# Patient Record
Sex: Female | Born: 1950 | Race: White | Hispanic: No | Marital: Married | Smoking: Current some day smoker
Health system: Southern US, Community
[De-identification: ages and names within clinical notes are randomized; demographics above are authoritative.]

## PROBLEM LIST (undated history)

## (undated) DIAGNOSIS — H269 Unspecified cataract: Secondary | ICD-10-CM

## (undated) DIAGNOSIS — I350 Nonrheumatic aortic (valve) stenosis: Secondary | ICD-10-CM

## (undated) DIAGNOSIS — T7840XA Allergy, unspecified, initial encounter: Secondary | ICD-10-CM

## (undated) DIAGNOSIS — E119 Type 2 diabetes mellitus without complications: Secondary | ICD-10-CM

## (undated) DIAGNOSIS — I639 Cerebral infarction, unspecified: Secondary | ICD-10-CM

## (undated) DIAGNOSIS — R011 Cardiac murmur, unspecified: Secondary | ICD-10-CM

## (undated) DIAGNOSIS — Z5189 Encounter for other specified aftercare: Secondary | ICD-10-CM

## (undated) HISTORY — DX: Nonrheumatic aortic (valve) stenosis: I35.0

## (undated) HISTORY — DX: Cerebral infarction, unspecified: I63.9

## (undated) HISTORY — PX: APPENDECTOMY: SHX54

## (undated) HISTORY — DX: Encounter for other specified aftercare: Z51.89

## (undated) HISTORY — PX: CHOLECYSTECTOMY: SHX55

## (undated) HISTORY — DX: Unspecified cataract: H26.9

## (undated) HISTORY — DX: Cardiac murmur, unspecified: R01.1

## (undated) HISTORY — PX: PANCREATECTOMY: SHX1019

## (undated) HISTORY — DX: Allergy, unspecified, initial encounter: T78.40XA

## (undated) HISTORY — PX: SPLENECTOMY, TOTAL: SHX788

## (undated) HISTORY — DX: Type 2 diabetes mellitus without complications: E11.9

---

## 1998-05-15 ENCOUNTER — Other Ambulatory Visit: Admission: RE | Admit: 1998-05-15 | Discharge: 1998-05-15 | Payer: Self-pay | Admitting: Obstetrics and Gynecology

## 1998-11-16 ENCOUNTER — Other Ambulatory Visit: Admission: RE | Admit: 1998-11-16 | Discharge: 1998-11-16 | Payer: Self-pay | Admitting: Obstetrics and Gynecology

## 2000-02-25 ENCOUNTER — Other Ambulatory Visit: Admission: RE | Admit: 2000-02-25 | Discharge: 2000-02-25 | Payer: Self-pay | Admitting: Obstetrics and Gynecology

## 2000-08-14 ENCOUNTER — Inpatient Hospital Stay (HOSPITAL_COMMUNITY): Admission: EM | Admit: 2000-08-14 | Discharge: 2000-08-15 | Payer: Self-pay | Admitting: Emergency Medicine

## 2000-08-14 ENCOUNTER — Encounter: Payer: Self-pay | Admitting: Emergency Medicine

## 2000-12-21 ENCOUNTER — Emergency Department (HOSPITAL_COMMUNITY): Admission: EM | Admit: 2000-12-21 | Discharge: 2000-12-22 | Payer: Self-pay | Admitting: Emergency Medicine

## 2000-12-21 ENCOUNTER — Encounter: Payer: Self-pay | Admitting: Emergency Medicine

## 2000-12-23 ENCOUNTER — Inpatient Hospital Stay (HOSPITAL_COMMUNITY): Admission: EM | Admit: 2000-12-23 | Discharge: 2000-12-31 | Payer: Self-pay | Admitting: Emergency Medicine

## 2000-12-23 ENCOUNTER — Encounter: Payer: Self-pay | Admitting: Emergency Medicine

## 2000-12-23 ENCOUNTER — Encounter (INDEPENDENT_AMBULATORY_CARE_PROVIDER_SITE_OTHER): Payer: Self-pay | Admitting: *Deleted

## 2000-12-23 ENCOUNTER — Encounter: Payer: Self-pay | Admitting: General Surgery

## 2000-12-28 ENCOUNTER — Encounter: Payer: Self-pay | Admitting: General Surgery

## 2001-06-25 ENCOUNTER — Other Ambulatory Visit: Admission: RE | Admit: 2001-06-25 | Discharge: 2001-06-25 | Payer: Self-pay | Admitting: Obstetrics and Gynecology

## 2001-06-28 ENCOUNTER — Encounter: Admission: RE | Admit: 2001-06-28 | Discharge: 2001-06-28 | Payer: Self-pay | Admitting: Obstetrics and Gynecology

## 2001-06-28 ENCOUNTER — Encounter: Payer: Self-pay | Admitting: Obstetrics and Gynecology

## 2002-10-09 ENCOUNTER — Other Ambulatory Visit: Admission: RE | Admit: 2002-10-09 | Discharge: 2002-10-09 | Payer: Self-pay | Admitting: Obstetrics and Gynecology

## 2003-07-07 ENCOUNTER — Emergency Department (HOSPITAL_COMMUNITY): Admission: EM | Admit: 2003-07-07 | Discharge: 2003-07-08 | Payer: Self-pay | Admitting: Emergency Medicine

## 2003-08-17 ENCOUNTER — Emergency Department (HOSPITAL_COMMUNITY): Admission: EM | Admit: 2003-08-17 | Discharge: 2003-08-17 | Payer: Self-pay | Admitting: Emergency Medicine

## 2004-07-12 ENCOUNTER — Inpatient Hospital Stay (HOSPITAL_COMMUNITY): Admission: EM | Admit: 2004-07-12 | Discharge: 2004-07-14 | Payer: Self-pay | Admitting: Emergency Medicine

## 2004-07-12 ENCOUNTER — Ambulatory Visit: Payer: Self-pay | Admitting: Family Medicine

## 2004-07-15 ENCOUNTER — Ambulatory Visit: Payer: Self-pay | Admitting: Family Medicine

## 2004-10-25 ENCOUNTER — Ambulatory Visit: Payer: Self-pay | Admitting: Family Medicine

## 2004-10-25 ENCOUNTER — Inpatient Hospital Stay (HOSPITAL_COMMUNITY): Admission: EM | Admit: 2004-10-25 | Discharge: 2004-10-27 | Payer: Self-pay | Admitting: Emergency Medicine

## 2005-09-26 ENCOUNTER — Emergency Department (HOSPITAL_COMMUNITY): Admission: EM | Admit: 2005-09-26 | Discharge: 2005-09-26 | Payer: Self-pay | Admitting: Emergency Medicine

## 2006-08-01 ENCOUNTER — Emergency Department (HOSPITAL_COMMUNITY): Admission: EM | Admit: 2006-08-01 | Discharge: 2006-08-01 | Payer: Self-pay | Admitting: Emergency Medicine

## 2006-09-07 DIAGNOSIS — F17200 Nicotine dependence, unspecified, uncomplicated: Secondary | ICD-10-CM | POA: Insufficient documentation

## 2007-01-04 ENCOUNTER — Emergency Department (HOSPITAL_COMMUNITY): Admission: EM | Admit: 2007-01-04 | Discharge: 2007-01-04 | Payer: Self-pay | Admitting: Emergency Medicine

## 2007-01-05 ENCOUNTER — Emergency Department (HOSPITAL_COMMUNITY): Admission: EM | Admit: 2007-01-05 | Discharge: 2007-01-06 | Payer: Self-pay | Admitting: Emergency Medicine

## 2007-01-06 ENCOUNTER — Inpatient Hospital Stay (HOSPITAL_COMMUNITY): Admission: EM | Admit: 2007-01-06 | Discharge: 2007-01-12 | Payer: Self-pay | Admitting: Family Medicine

## 2008-01-06 ENCOUNTER — Ambulatory Visit: Payer: Self-pay | Admitting: Cardiology

## 2008-01-10 ENCOUNTER — Observation Stay (HOSPITAL_COMMUNITY): Admission: EM | Admit: 2008-01-10 | Discharge: 2008-01-11 | Payer: Self-pay | Admitting: Emergency Medicine

## 2008-01-28 ENCOUNTER — Ambulatory Visit: Payer: Self-pay

## 2008-02-13 ENCOUNTER — Ambulatory Visit: Payer: Self-pay | Admitting: Cardiology

## 2008-03-10 ENCOUNTER — Ambulatory Visit: Payer: Self-pay | Admitting: Endocrinology

## 2008-03-10 DIAGNOSIS — F329 Major depressive disorder, single episode, unspecified: Secondary | ICD-10-CM | POA: Insufficient documentation

## 2008-03-10 DIAGNOSIS — E1169 Type 2 diabetes mellitus with other specified complication: Secondary | ICD-10-CM | POA: Insufficient documentation

## 2008-03-10 DIAGNOSIS — K5730 Diverticulosis of large intestine without perforation or abscess without bleeding: Secondary | ICD-10-CM | POA: Insufficient documentation

## 2008-03-10 DIAGNOSIS — E785 Hyperlipidemia, unspecified: Secondary | ICD-10-CM

## 2008-03-10 DIAGNOSIS — E119 Type 2 diabetes mellitus without complications: Secondary | ICD-10-CM | POA: Insufficient documentation

## 2008-04-09 ENCOUNTER — Ambulatory Visit: Payer: Self-pay | Admitting: Endocrinology

## 2008-05-16 ENCOUNTER — Ambulatory Visit: Payer: Self-pay | Admitting: Cardiology

## 2008-09-07 IMAGING — CR DG TIBIA/FIBULA 2V*L*
4 series · 4 of 4 positions shown · non-contrast
Comparison: none

CLINICAL DATA: Left lower leg laceration, cat bite, pain and swelling.      
 LEFT TIBIA AND FIBULA - 2 VIEW:

[t tib/fib ap left (1 of 2)]
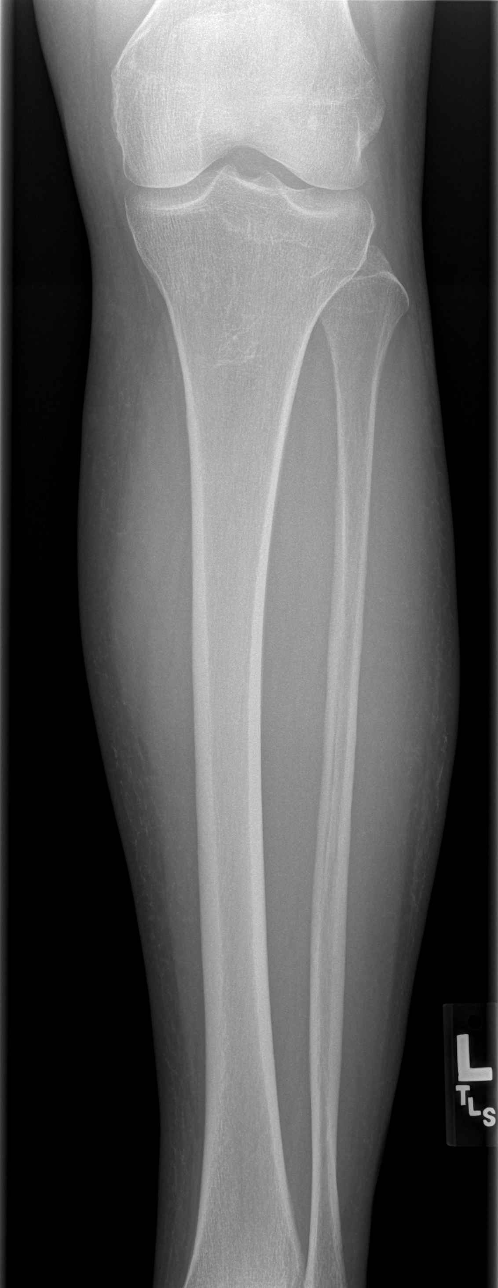

[t tib/fib ap left (2 of 2)]
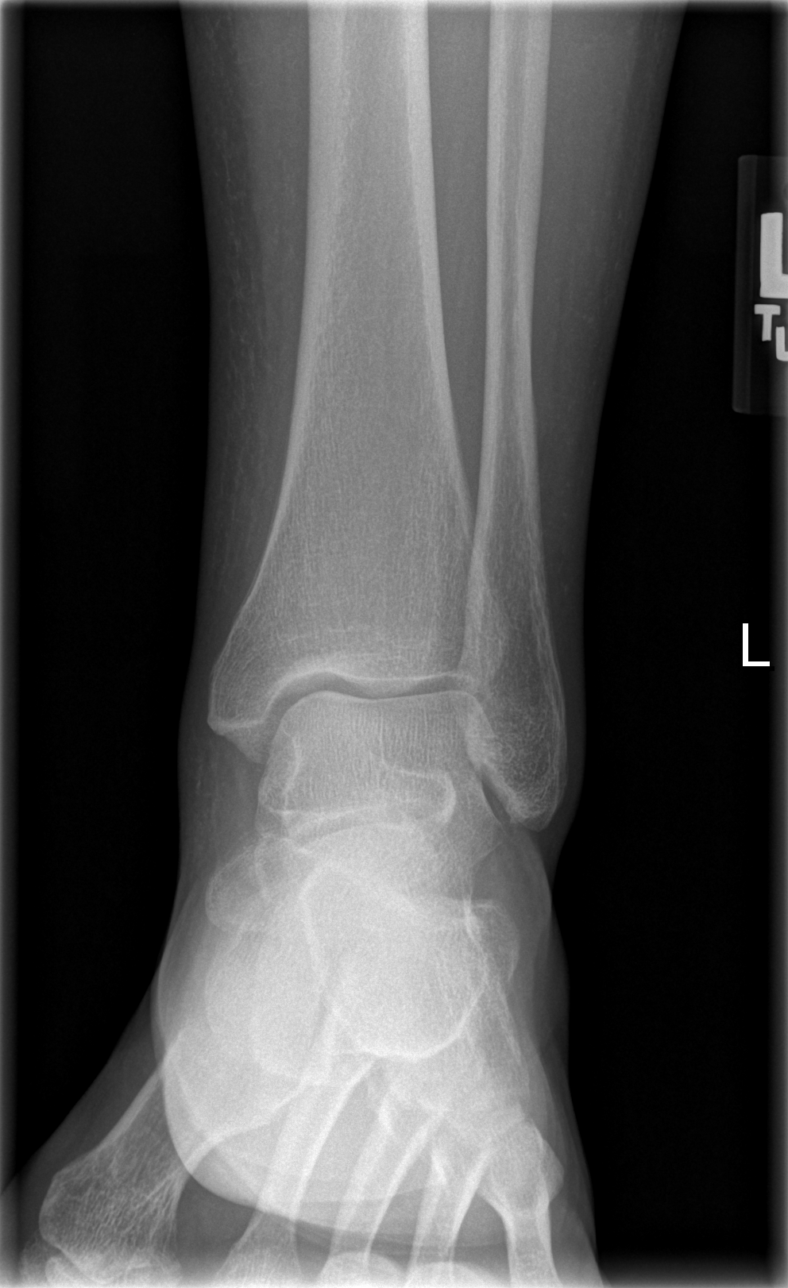

[t tib/fib lat left (1 of 2)]
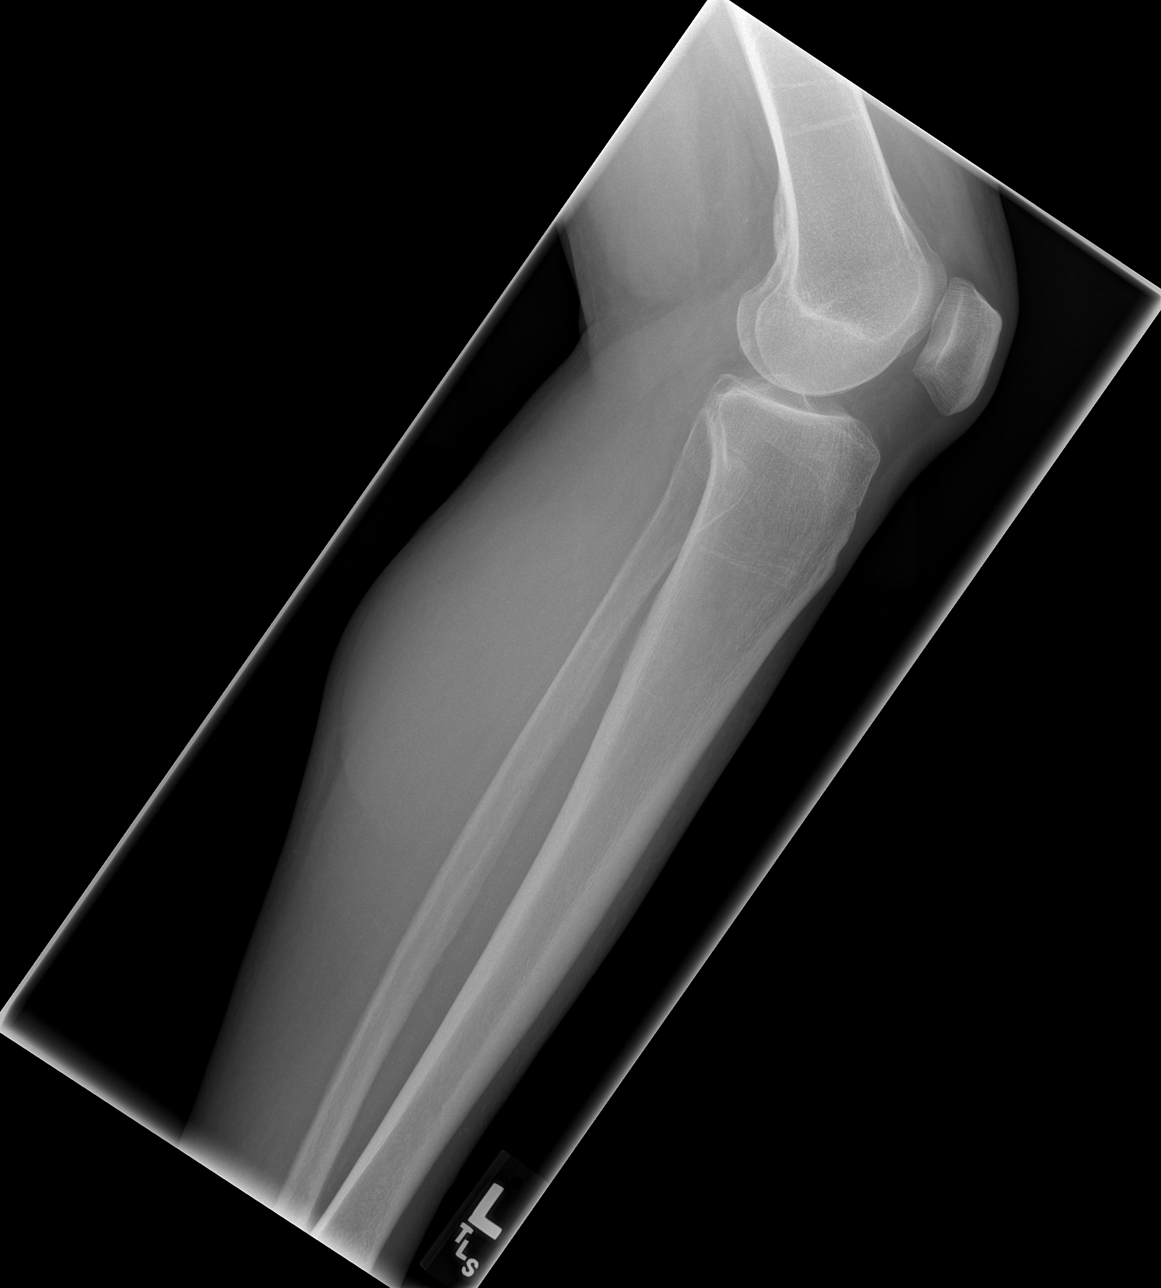

[t tib/fib lat left (2 of 2)]
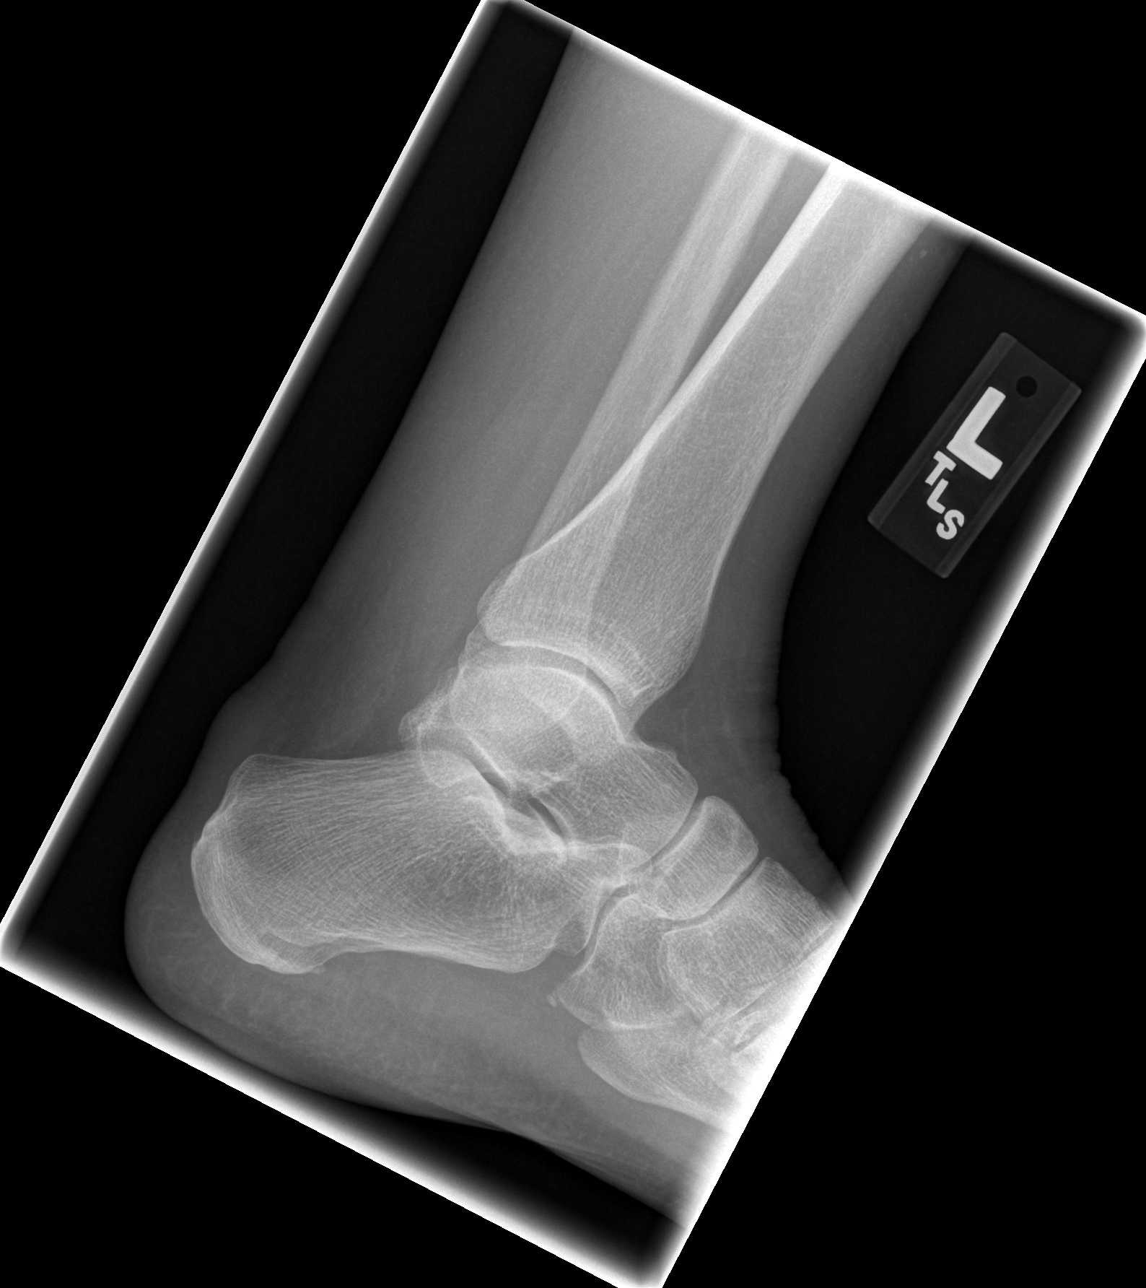

[4 of 4 positions shown; findings below may reference images not displayed]

FINDINGS: Bones are osteopenic out of proportion to that expected for the patient?s age.  No acute fracture, soft tissue abnormality, or radiopaque foreign body.
IMPRESSION: Normal exam.  Osteopenia.

## 2009-02-10 ENCOUNTER — Ambulatory Visit: Payer: Self-pay | Admitting: Endocrinology

## 2009-02-10 DIAGNOSIS — E7800 Pure hypercholesterolemia, unspecified: Secondary | ICD-10-CM | POA: Insufficient documentation

## 2009-02-10 LAB — CONVERTED CEMR LAB
ALT: 17 units/L (ref 0–35)
AST: 19 units/L (ref 0–37)
Albumin: 4 g/dL (ref 3.5–5.2)
CO2: 28 meq/L (ref 19–32)
Calcium: 9.5 mg/dL (ref 8.4–10.5)
Chloride: 106 meq/L (ref 96–112)
Cholesterol: 245 mg/dL — ABNORMAL HIGH (ref 0–200)
Creatinine, Ser: 0.8 mg/dL (ref 0.4–1.2)
HDL: 38.8 mg/dL — ABNORMAL LOW (ref >39.00)
Hgb A1c MFr Bld: 6.5 % (ref 4.6–6.5)
Sodium: 144 meq/L (ref 135–145)
TSH: 1.45 microintl units/mL (ref 0.35–5.50)
Total Bilirubin: 0.7 mg/dL (ref 0.3–1.2)
Triglycerides: 205 mg/dL — ABNORMAL HIGH (ref 0.0–149.0)
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
## 2010-11-23 NOTE — Discharge Summary (Signed)
NAMEALTO, MONGOLD NO.:  [id number]   MEDICAL RECORD NO.:  [id number]          PATIENT TYPE:  INP   LOCATION:  5010                         FACILITY:  [facility]   PHYSICIAN:  Altha Harm, MDDATE OF BIRTH:  1951-04-27   DATE OF ADMISSION:  01/06/2007  DATE OF DISCHARGE:  01/12/2007                               DISCHARGE SUMMARY   DISCHARGE DISPOSITION:  Home.   FINAL DISCHARGE DIAGNOSES:  1. Cat scratch lesion.  2. Cellulitis of left lower extremity.  3. Diabetes type 2, uncontrolled.  4. Candida vaginitis.  5. Osteopenia   DISCHARGE MEDICATIONS:  1. Avelox 400 mg p.o. daily x7 days.  2. Diflucan 150 mg p.o. x1 to be taken at the completed course of the      antibiotics.  3. Amaryl 1 mg p.o. daily.  4. Calcium plus vitamin D 100 mg b.i.d.   CONSULTANTS:  None.   PROCEDURES:  None.   DIAGNOSTIC STUDIES:  1. CT of the lower extremity done on June 29 which showed no evidence      of a focal abscess or bony destruction.  However, the cellulitis      does come in contact with the lateral malleolus of the ankle.  2. Ultrasound of the left lower extremity done on July 2 which shows      edema without abscess.  3. X-ray of tibia-fibula done on June 26 which showed normal      examination with mild osteopenia.   CODE STATUS:  Full code.   ALLERGIES:  NO KNOWN DRUG ALLERGIES.   CHIEF COMPLAINT:  Leg pain.   HISTORY OF PRESENT ILLNESS:  Please see the H&P dictated by Dr. Lavera Guise for  details of the HPI.   HOSPITAL COURSE:  1. Cellulitis of the left lower extremity.  The patient was      immediately started on IV Zosyn and doxycycline.  The patient had      blood cultures which were negative to date.  The patient also had a      culture of the wound which showed no WBCs, squamous epithelial      cells, or organisms.  However, please note that the culture of the      wound was a very superficial culture without any discharge.  The      patient had  warm compresses placed on the left lower extremity, and      the wound had a spontaneous eruption with drainage of contents.      Without any cultures to guide the course of therapy, the patient      was removed from IV antibiotics and transferred over to oral      antibiotics.  The patient was initially placed on oral doxycycline      and had a deterioration in clinical course.  This was changed to      p.o. Avelox, and the patient had a marked improvement in her      clinical course both locally and in terms of fevers and      decelerating white blood cell count.  The patient is  going to be      continued on Avelox for a total of 7 additional days, and the      patient has been advised to follow up with her primary care      physician at the end of the course of the antibiotics.  Please note      that currently the patient does not have a primary care physician      and has been given recommendations for primary care physicians to      follow up with.  However, the patient states that she would like to      find her own primary care physician as she believes that she has      several to chose from.  2. Diabetes type 2.  The patient is a known diabetic who has been      without any medications for several years.  During the      hospitalization, the patient had uncontrolled blood sugars and the      patient was started on Amaryl 1 mg.  This brought her blood sugars      into very good control without any episodes of hypoglycemia.  The      patient is going to continue on 1 mg of Amaryl and should see her      primary care physician to have blood sugars checked and to have      medications titrated as necessary.  The patient has been advised      that she needs to have her eyes checked on a yearly basis and also      to have her laboratories done on a yearly basis.  The patient also      needs to have a fasting lipid profile.  However, this was not done      while hospitalized and should  be followed up with her primary care      physician.  3. Osteopenia.  The patient was found on x-ray to have osteopenia and      will be discharged home on calcium plus vitamin D to follow up with      her primary care physician for any needs for bisphosphonates in the      future.   DIETARY RESTRICTIONS:  The patient should be on a diabetic diet.   PHYSICAL RESTRICTIONS:  None.   FOLLOWUP:  The patient is to follow up with primary care physician  within the next 7 days.      Altha Harm, MD  Electronically Signed     MAM/MEDQ  D:  01/12/2007  T:  01/12/2007  Job:  [id number]
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
## 2010-11-26 NOTE — Discharge Summary (Signed)
[place]  Patient:    Autumn Walker, Autumn Walker                      MRN: [id number] Adm. Date:  [id number] Disc. Date: [id number] Attending:  Corliss Marcus Dictator:   Gwenlyn Perking, M.D. CC:         Duncan Dull, M.D., [facility]   Discharge Summary  PRIMARY CARE PHYSICIAN:  Dr. Shaune Pollack at [facility].  ADMISSION DIAGNOSES: 1. Chest pain rule out myocardial infarction. 2. Hypokalemia. 3. Tobacco dependence. 4. Obesity.  DISCHARGE DIAGNOSES: 1. Chest pain resolved.  Patient ruled out for cardiac ischemia/infarction by    electrocardiogram and enzymes. 2. Hypokalemia. 3. Tobacco dependence. 4. Dyslipidemia.  MEDICATIONS AT DISCHARGE: 1. Birth control per primary care physician. 2. Lipitor 10 mg one p.o. q.h.s. 3. K-Dur 20 mEq one p.o. q.d.  INSTRUCTIONS: 1. Activity: Patient is to increase her activity level and she should be    walking 15-20 minutes per day. 2. Diet: Low fat, low salt. 3. Symptoms to warrant further treatment:  Should the patient experience any    further chest pain episodes, she is to either call her primary care    physician or she is to come back to [facility] for further    evaluation and treatment.  ALLERGIES:  CODEINE.  FOLLOWUP:  Patient is to follow up with Dr. Kevan Ny in one to two weeks for several issues:  1. She is to get her electrolytes rechecked to see if our replacement therapy    with K-Dur 20 mEq p.o. q.d. is sufficient to correct her very mild    hypokalemia. 2. In addition, she is to be counseled again on the importance of    tobacco/smoking cessation. 3. A further workup for this chest discomfort may be initiated, the two    possibilities are musculoskeletal and/or GI source. 4. Further of note is that the patient may need to be counseled on alternative    birth control methods as she is older than age 52 and she has a history of    smoking. 5.  Patient may follow up with Dr. Amil Amen of cardiology should a further need    arise for this.  CONSULTATIONS:  None.  PROCEDURES:  Exercise stress test results still pending at the time of dictation performed August 15, 2000.  HISTORY AND PHYSICAL EXAMINATION:  See admission H&P.  HOSPITAL COURSE:  Patient is a 60 year old white female who was sent to the emergency department for evaluation and treatment by the [facility].  There the patient had presented with chest pain.  The patient is a patient of Dr. Shaune Pollack at [facility].  The patients chest pain had decreased tremendously in severity by the time the patient presented to the emergency department.  Nevertheless, given her numerous risk factor for cardiovascular disease, including smoking, family history and sedentary lifestyle/obesity, it was deemed safe and necessary to admit this patient to rule her out by EKGs as well as by cardiac enzymes.  This in fact was accomplished.  Her first set of enzymes was CK 69, MB fraction 0.9, troponin I less than 0.01.  Subsequent set was CK 52, MB 0.7, and troponin I less than 0.01.  For further risk factor stratification, a fasting lipid panel was obtained the morning after admission.  This was as follows: Total cholesterol 221, triglycerides 201, HDL 31, and LDL 150.  In addition, on  admission, her potassium was found to be just slightly abnormal at 3.1 which was replaced with 40 mEq of KCl q.d.  The patient did well overnight and did not have any further episodes of chest pain.  Telemetry done overnight did not reveal any arrhythmias.  On August 15, 2000, it was decided that the patient had benefited maximally from this hospital admission and could be discharged home safely; however, the exercise stress test results were still pending at the time of dictation.  DISCHARGE CONDITION:  Stable.  DISPOSITION:  Discharge the patient home. DD:  08/15/00 TD:   08/16/00 Job: [id number]

## 2010-11-26 NOTE — Discharge Summary (Signed)
NAMECRYSTALL, MONTOUR            ACCOUNT NO.:  [id number]   MEDICAL RECORD NO.:  [id number]          PATIENT TYPE:  INP   LOCATION:  5711                         FACILITY:  [facility]   PHYSICIAN:  Leighton Roach McDiarmid, M.D.DATE OF BIRTH:  05/12/1951   DATE OF ADMISSION:  10/24/2004  DATE OF DISCHARGE:  10/27/2004                                 DISCHARGE SUMMARY   DISCHARGE MEDICATIONS:  1.  Metamucil 1 to 2 teaspoons p.o. daily (may increase to twice a day).  2.  Colace (100 mg) 1 tablet p.o. daily.   DISCHARGE DIAGNOSES:  Small-bowel obstruction which resolved with medical  treatment.   BRIEF HISTORY:  A 60 year old white female with history of multiple  abdominal surgeries and previous admission for small-bowel obstruction which  resolved with medical treatment.  Came to [facility] ED complaining of  nausea, vomiting, and abdominal pain for one day.  The patient vomited six  times, denies blood.  Vomitus described as bilious and stools.  The  patient's last bowel movement was April 16 at 10 a.m.  The patient denies  passing gas.  Abdominal pain is located mostly in central abdomen but is  spread out throughout the abdomen with intensity of 9/10.  The patient is  complaining of continuous pain and crampy pain that appears suddenly,  increasing in intensity, and then decreasing in intensity.  The patient  denies fever, cough, dysuria.  Last admission for this patient was in  January 2006 with partial small-bowel obstruction which resolved with  medical treatment.  The patient stated this is similar presentation compared  with January 2006.   SMALL-BOWEL OBSTRUCTION:  Given patient's history of multiple abdominal  surgeries, and previous history of small-bowel obstruction, CT scan was  requested.  CT scan showed dilated proximal and mid small-bowel loops,  likely small-bowel obstruction.  Ischemia was less likely.  Small free fluid  in pelvis was observed.  No free air.  The patient  had a WBC of 18,000.  BMP  was within normal limits.  Dr. Violeta Gelinas was consulted.  GI agreed with  small-bowel obstruction diagnosis secondary to postoperative adhesions.  NG  tube for decompression was placed.  The patient was n.p.o. and IV fluids.  Morphine for pain.  Throughout the patient's hospitalization, pain was  improving.  Nausea and vomiting resolved.  NG tube was clamped, and diet was  advanced to clear.  The patient tolerated p.o. intake.  Positive flatus,  bowel movement x 2.  Abdominal pain improved dramatically.  The patient was  discharged home on Metamucil and Colace.  Increased fluid intake, and  patient was on fluids, and diet was indicated.  The patient has followup  appointment in two to three weeks with Dr. Cleta Alberts at [facility].   PROCEDURES:  Limited CT scan.   CONSULTATIONS:  Gabrielle Dare. Janee Morn, M.D.   CONDITION ON DISCHARGE:  S[place].   FOLLOW UP:  Appointment with Dr.  Cleta Alberts at [facility]  [address] on Nov 17, 2004, at 1:30 p.m.      FIM/MEDQ  D:  10/27/2004  T:  10/27/2004  Job:  [id number]   cc:   Dr. Cleta Alberts, [facility] [place]  FAX [phone]   Gabrielle Dare. Janee Morn, M.D.  [facility]  [address] [place], [place] [address]
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
## 2010-11-26 NOTE — Discharge Summary (Signed)
[place]. [facility]  Patient:    Autumn Walker, Autumn Walker                      MRN: [id number] Adm. Date:  [id number] Disc. Date: [id number] Attending:  Corliss Marcus Dictator:   Gwenlyn Perking, M.D. CC:         Duncan Dull, M.D.   Discharge Summary  PRIMARY CARE PHYSICIAN:  Duncan Dull, M.D., [facility].  ADDENDUM:  An exercise treadmill test was performed on August 14, 2000, and this was within normal limits.  No evidence of ischemia was found on her EKG. The patient did reach 90% of her maximal heart rate, therefore the study was valid. She got through Bruce protocol 2. DD:  08/15/00 TD:  08/16/00 Job: [id number]
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
## 2010-11-26 NOTE — H&P (Signed)
[place]. [facility]  Patient:    Autumn Walker, Autumn Walker                      MRN: [id number] Adm. Date:  [id number] Attending:  Corliss Marcus Dictator:   Gwenlyn Perking, M.D. CC:         Duncan Dull, M.D., [facility]   History and Physical  PROBLEM LIST:  1. Chest pain, rule out myocardial infarction.  2. Hypokalemia.  CHIEF COMPLAINT: Chest pain.  HISTORY OF PRESENT ILLNESS: The patient is a 60 year old white female who presents to the emergency department of [place]. [facility] after being referred here from [facility] walk-in clinic.  The patient had sudden onset sharp chest pain radiating to his back and upper right neck occurring the morning of admission.  The pain was initially constant, lasting approximately four hours, but then "eased up."  The patient tried two Pepto-Bismol and two ibuprofen, which helped a little bit.  The patient states that she has never had this pain before.  There were no aggravating factors. Food did not exacerbate this pain.  After spending all day at work the patient went to the [place] walk-in clinic and the patient was seen over there.  The patient denies a history of shortness of breath, dyspnea on exertion, or orthopnea either recent or in the past.  The patient further denies a history of concomitant diaphoresis, nausea or vomiting.  The patient also denies a history of presyncopal or syncopal episodes and no palpitations.  ALLERGIES: CODEINE.  PAST MEDICAL HISTORY:  1. Cholelithiasis, status post cholecystectomy 30 years ago.  2. History of sinusitis.  3. History of abnormal Pap smears, status post colposcopy.  PAST SURGICAL HISTORY:  1. Cholecystectomy.  2. Three vaginal deliveries.  FAMILY HISTORY: Mother, age 14, has thyroid problems.  No cerebrovascular disease.  Father had his first MI at the age of 34 and since that time he has had 14 aneurysm repair surgeries of the  abdomen.  The patient has two sisters who are healthy; however, one has fairly severe mitral valve prolapse.  One brother is healthy.  One other brother died - he committed suicide.  CURRENT MEDICATIONS: Birth control pills.  SOCIAL HISTORY: The patient smokes one packs of cigarettes a day and drinks one glass of wine per month.  No history of cocaine use, either recent or remote.  The patient does, however, admit to remote [facility] use (marijuana).  The patient works at [employer] as a Investment banker, corporate.  CARDIOVASCULAR RISK FACTORS:  1. Smoking.  2. Family history of father with early age cardiovascular event.  3. Patient obese/leads a sedentary lifestyle.  REVIEW OF SYSTEMS: No recent history of fever or chills.  Positive sinus problems.  Wears glasses.  No cough.  No hemoptysis.  Positive history of constipation, usually having one bowel movement every seven to ten days. Occasional "black bowel movement."  No dysuria.  Positive fluid retention and occasionally takes fluid pill for that from her OB/GYN physician.  No joint problems.  No depression.  PHYSICAL EXAMINATION:  VITAL SIGNS: Temperature 97.7 degrees, blood pressure 110/50, heart rate 88, respiratory rate 20.  Oxygen saturation 100%.  GENERAL: The patient is a well-developed, well-nourished, obese 60 year old white female, alert and oriented and in no apparent distress.  HEENT: Head normocephalic, atraumatic.  PERRLA.  EOMI.  Conjunctivae and lids without lesions.  The patient wears glasses.  Ears, nose, and throat grossly  within normal limits; however, posterior oropharynx reveals erythema and cobblestoning.  NECK: No JVD, no bruits, no lymphadenopathy.  She does have some fullness over her thyroid gland.  CARDIOVASCULAR: Regular rate and rhythm without murmurs, rubs, or gallops. PMI not enlarged or displaced.  Carotids with normal upstroke.  RESPIRATORY: Clear to auscultation bilaterally.  No egophony.   No whispered pectoriloquy.  ABDOMEN: Soft and nondistended, with normoactive bowel sounds.  There is some tenderness to deep palpation in the right upper and right lower quadrant. There is a large cholecystectomy scar noted on the right upper abdomen.  No hepatosplenomegaly.  GU: Examination deferred.  EXTREMITIES: No clubbing, cyanosis, or edema.  Full range of motion of all major joints.  Muscle strength 5/5 and symmetric throughout.  NEUROLOGIC: Cranial nerves 2-12 grossly intact without sensory or motor deficits.  No ataxia.  LABORATORY DATA: EKG reveals normal sinus rhythm at a rate of 90, no ST changes suggesting ischemia or infarction.  Sodium 137, potassium 3.1, chloride 102, CO2 26, BUN 10, creatinine 0.7, glucose 99.  Hemoglobin 13.1, hematocrit 38.5, WBC 10.8; platelets 264,000. CK 69, MB 0.9.  Troponin I less than 0.09.  Calcium 9.3, total protein 7.6, albumin 4.1.  PTT 28.  ASSESSMENT: The patient is a 60 year old white female with a one day history of chest pain.  Both history and azotemia profile make a cardiac etiology of this chest pain fairly unlikely; however, given her cardiovascular risk factors including smoking, family history, and obesity, will admit the patient to rule out myocardial infarction with serial enzymes.  The patient on admission was also found to be hypokalemic.  PLAN:  1. Admit patient to [facility] Cardiology service, attending Dr. Amil Amen.  2. Lopressor 25 mg p.o. b.i.d.  3. Serial cardiac enzymes, repeat EKG in a.m.  4. Fasting lipids in a.m. for further risk stratification.  5. Provide analgesia as necessary.  6. Monitor and replace for hypokalemia. DD:  08/14/00 TD:  08/15/00 Job: [id number]

## 2010-11-26 NOTE — Discharge Summary (Signed)
[place]  Autumn, Walker NO.:  [id number]   MEDICAL RECORD NO.:  [id number]          PATIENT TYPE:  INP   LOCATION:  5502                         FACILITY:  [facility]   PHYSICIAN:  Asencion Partridge, M.D.     DATE OF BIRTH:  Dec 02, 1950   DATE OF ADMISSION:  07/12/2004  DATE OF DISCHARGE:  07/14/2004                                 DISCHARGE SUMMARY   DISCHARGE DIAGNOSES:  Partial small bowel obstruction.   DISCHARGE MEDICATIONS:  Colace 100 mg p.o. b.i.d.   FOLLOW UP:  The patient has a followup appointment scheduled on the 11th of  January at 3:30 with Hansel Feinstein, M.D. at [facility].   HOSPITAL COURSE:  Autumn Walker is a 60 year old woman who presented to the  emergency department on the 2nd of January 2006 complaining of three days of  increasing abdominal distention and discomfort.  Her last bowel movement had  been on December 31 and it was a normal stool. She denied having any flatus  and little belching over the three prior days to presentation. She began  having emesis on the evening of January 1 that was bilious and nonbloody  associated with diffuse abdominal discomfort. She denied chest pain, cough,  temperatures, chills or sweats. She treated with Pepto-Bismol without  relief. She was unable to tolerate p.o.  She has no history of prior similar  symptoms and she had no associated other symptoms such as chest pain, cough  or temperatures.   PAST MEDICAL HISTORY:  Significant only for being post menopausal and having  chronic sinusitis. The only medicine she takes on an outpatient basis are  Neo-Synephrine nasal spray as needed at night.   ADMISSION LABORATORY DATA:  White blood cell count of 14.6, hemoglobin 17.3,  platelets 114, ANC 9.3, ALC 3.6, sodium 137, potassium 3.9, bicarb 29,  chloride 104, BUN 12, creatinine 0.8, glucose 195.  Total bilirubin 0.7,  alkaline phosphatase 92, AST 20, ALT 22, total protein 7.6, albumin 4.1.   Chest  x-ray showed no acute disease and an acute abdominal series showed  dilated loops of small bowel with air fluid levels. The patient was admitted  to the floor in stable condition because she had not vomited in more than 12  hours, no NG tube was placed. She had a history of multiple abdominal  surgeries including a total splenectomy and a distal pancreatectomy,  cholecystectomy and appendectomy in her past and therefore small bowel  obstruction was suspected.  She was provided with IV Phenergan and Dilaudid  for pain and nausea as needed and kept n.p.o.  She was given a bolus of 1  liter of normal saline and then placed on maintenance IV fluids for  suspected volume depletion.  Over the next 24 hours, the patient improved,  denied nausea or vomiting. IV medications were stopped and she was changed  to Tylenol p.o. and received one dose of Tylenol with codeine p.o.  She was  advanced to a diet of clears and given a suppository of Dulcolax after which  she was able to pass a bowel movement.  Unfortunately this  bowel movement  was unrecorded by nursing as it was not caught in a hat.  The patient's diet  was continued to  advance overnight of the 3rd.  She tolerated solids well and has passed  flatus in the morning of the 4th. She is continuing to improve and will be  discharged on 100 mg of Colace by mouth twice daily. She will followup with  Dr. Emilia Beck on January 11 in the [facility] at 3:30 p.m.       EP/MEDQ  D:  07/14/2004  T:  07/14/2004  Job:  [id number]   cc:   Hansel Feinstein, M.D.

## 2011-04-07 LAB — TSH: TSH: 1.374 (ref 0.350–4.500)

## 2011-04-07 LAB — LIPID PANEL
HDL: 22 — ABNORMAL LOW
Triglycerides: 635 — ABNORMAL HIGH

## 2011-04-07 LAB — COMPREHENSIVE METABOLIC PANEL
ALT: 13
AST: 14
Albumin: 3.5
BUN: 13
CO2: 25
CO2: 27
Calcium: 9
Calcium: 9.2
Creatinine, Ser: 0.67
Creatinine, Ser: 0.79
GFR calc Af Amer: >60
GFR calc non Af Amer: >60
GFR calc non Af Amer: >60
Glucose, Bld: 365 — ABNORMAL HIGH
Total Protein: 6.6
Total Protein: 7.1

## 2011-04-07 LAB — URINALYSIS, ROUTINE W REFLEX MICROSCOPIC
Bilirubin Urine: NEGATIVE
Ketones, ur: 15 — AB
Nitrite: NEGATIVE
Protein, ur: NEGATIVE
pH: 5.5

## 2011-04-07 LAB — DIFFERENTIAL
Eosinophils Absolute: 0.1
Lymphocytes Relative: 29
Lymphs Abs: 4.1 — ABNORMAL HIGH
Neutro Abs: 8.7 — ABNORMAL HIGH
Neutrophils Relative %: 63

## 2011-04-07 LAB — CBC
HCT: 48.2 — ABNORMAL HIGH
Hemoglobin: 16.5 — ABNORMAL HIGH
MCHC: 34.3
MCHC: 35.5
MCV: 93.2
MCV: 93.3
Platelets: 301
RBC: 5.17 — ABNORMAL HIGH
RDW: 12.7
RDW: 13.1

## 2011-04-07 LAB — POCT CARDIAC MARKERS
CKMB, poc: <1 — ABNORMAL LOW
Myoglobin, poc: 25.8
Myoglobin, poc: 49.6
Operator id: 146091
Operator id: 146091
Troponin i, poc: <0.05

## 2011-04-07 LAB — HEMOGLOBIN A1C: Mean Plasma Glucose: 371

## 2011-04-07 LAB — CARDIAC PANEL(CRET KIN+CKTOT+MB+TROPI)
Relative Index: INVALID
Troponin I: 0.01
Troponin I: <0.01

## 2011-04-07 LAB — TROPONIN I: Troponin I: <0.01

## 2011-04-07 LAB — URINE MICROSCOPIC-ADD ON

## 2011-04-07 LAB — LIPASE, BLOOD: Lipase: 22

## 2011-04-26 LAB — URINALYSIS, ROUTINE W REFLEX MICROSCOPIC
Bilirubin Urine: NEGATIVE
Glucose, UA: NEGATIVE
Ketones, ur: NEGATIVE
Protein, ur: NEGATIVE

## 2011-04-26 LAB — DIFFERENTIAL
Basophils Absolute: 0.1
Basophils Relative: 1
Eosinophils Absolute: 0.5
Lymphs Abs: 6.6 — ABNORMAL HIGH
Lymphs Abs: 7.4 — ABNORMAL HIGH
Monocytes Absolute: 1.2 — ABNORMAL HIGH
Monocytes Absolute: 1.2 — ABNORMAL HIGH
Neutrophils Relative %: 33 — ABNORMAL LOW

## 2011-04-26 LAB — CBC
Hemoglobin: 14.5
MCHC: 33.9
MCHC: 34
MCHC: 34.4
MCV: 92.9
MCV: 93.1
Platelets: 384
RBC: 4.59
RDW: 13.9
RDW: 14.2 — ABNORMAL HIGH
WBC: 13.1 — ABNORMAL HIGH

## 2011-04-26 LAB — BASIC METABOLIC PANEL
CO2: 29
Chloride: 103
Creatinine, Ser: 0.9
GFR calc Af Amer: >60

## 2011-04-27 LAB — CBC
HCT: 39.1
HCT: 42.9
HCT: 45.3
Hemoglobin: 13.6
Hemoglobin: 14.7
Hemoglobin: 15.4 — ABNORMAL HIGH
MCHC: 34.7
MCV: 93
MCV: 93.3
Platelets: 317
Platelets: 352
Platelets: 367
RDW: 13.5
WBC: 13.4 — ABNORMAL HIGH
WBC: 14.6 — ABNORMAL HIGH

## 2011-04-27 LAB — DIFFERENTIAL
Basophils Absolute: 0.2 — ABNORMAL HIGH
Eosinophils Absolute: 0.3
Eosinophils Relative: 2
Eosinophils Relative: 3
Lymphocytes Relative: 39
Lymphs Abs: 5.2 — ABNORMAL HIGH
Lymphs Abs: 5.2 — ABNORMAL HIGH
Monocytes Absolute: 1.4 — ABNORMAL HIGH
Monocytes Relative: 10
Neutro Abs: 6.5

## 2011-04-27 LAB — CULTURE, BLOOD (ROUTINE X 2)
Culture: NO GROWTH
Culture: NO GROWTH

## 2011-04-27 LAB — BASIC METABOLIC PANEL
BUN: 12
CO2: 26
Chloride: 104
Chloride: 106
GFR calc Af Amer: >60
GFR calc non Af Amer: >60
GFR calc non Af Amer: >60
Glucose, Bld: 162 — ABNORMAL HIGH
Potassium: 3.9
Potassium: 3.9
Sodium: 138

## 2011-04-27 LAB — I-STAT 8, (EC8 V) (CONVERTED LAB)
BUN: 14
Bicarbonate: 24.9 — ABNORMAL HIGH
Chloride: 106
Glucose, Bld: 137 — ABNORMAL HIGH
Operator id: 192351
pCO2, Ven: 36.1 — ABNORMAL LOW
pH, Ven: 7.448 — ABNORMAL HIGH

## 2011-04-27 LAB — POCT I-STAT CREATININE
Creatinine, Ser: 1.1
Operator id: 192351

## 2011-04-27 LAB — HEMOGLOBIN A1C: Hgb A1c MFr Bld: 7.8 — ABNORMAL HIGH

## 2015-03-04 LAB — HM DIABETES EYE EXAM

## 2016-03-01 ENCOUNTER — Encounter: Payer: Self-pay | Admitting: Family Medicine

## 2016-03-01 ENCOUNTER — Ambulatory Visit (INDEPENDENT_AMBULATORY_CARE_PROVIDER_SITE_OTHER): Payer: Managed Care, Other (non HMO) | Admitting: Family Medicine

## 2016-03-01 VITALS — BP 116/76 | HR 100 | Temp 97.5°F | Resp 17 | Ht 61.0 in | Wt 141.0 lb

## 2016-03-01 DIAGNOSIS — B029 Zoster without complications: Secondary | ICD-10-CM | POA: Diagnosis not present

## 2016-03-01 MED ORDER — GABAPENTIN 300 MG PO CAPS: 300.0000 mg | ORAL_CAPSULE | Freq: Three times a day (TID) | ORAL | 0 refills | Status: DC

## 2016-03-01 MED ORDER — ACYCLOVIR 200 MG PO CAPS: 200.0000 mg | ORAL_CAPSULE | Freq: Every day | ORAL | 0 refills | Status: AC

## 2016-03-01 NOTE — Patient Instructions (Addendum)
Start Acyclovir 200 mg 5 times daily for 7 days.  Start Gabapentin 300 mg  Up to 3 times daily as needed for pain.  Pain can persists for several weeks.  Follow-up as needed.  Carroll Sage. Kenton Kingfisher, MSN, FNP-C [facility]  IF you received an x-ray today, you will receive an invoice from [facility]. Please contact [facility] at [phone] with questions or concerns regarding your invoice.   IF you received labwork today, you will receive an invoice from [employer]. Please contact [facility] at [phone] with questions or concerns regarding your invoice.   Our billing staff will not be able to assist you with questions regarding bills from these companies.  You will be contacted with the lab results as soon as they are available. The fastest way to get your results is to activate your My Chart account. Instructions are located on the last page of this paperwork. If you have not heard from [place] regarding the results in 2 weeks, please contact this office.     We recommend that you schedule a mammogram for breast cancer screening. Typically, you do not need a referral to do this. Please contact a local imaging center to schedule your mammogram.  [facility] - [phone]  *ask for the Radiology Department [facility]) - [phone] or [phone]  [facility] - [phone] [place] [phone] [facility] - [phone]  *ask for the [facility] - [phone]  *ask for the Radiology Department [facility] - [phone]  *ask for the [place] - [phone]   Shingles Shingles is an infection that causes a painful skin rash and fluid-filled blisters. Shingles is caused by the same virus that causes chickenpox. Shingles only develops  in people who:  Have had chickenpox.  Have gotten the chickenpox vaccine. (This is rare.) The first symptoms of shingles may be itching, tingling, or pain in an area on your skin. A rash will follow in a few days or weeks. The rash is usually on one side of the body in a bandlike or beltlike pattern. Over time, the rash turns into fluid-filled blisters that break open, scab over, and dry up. Medicines may:  Help you manage pain.  Help you recover more quickly.  Help to prevent long-term problems. HOME CARE Medicines  Take medicines only as told by your doctor.  Apply an anti-itch or numbing cream to the affected area as told by your doctor. Blister and Rash Care  Take a cool bath or put cool compresses on the area of the rash or blisters as told by your doctor. This may help with pain and itching.  Keep your rash covered with a loose bandage (dressing). Wear loose-fitting clothing.  Keep your rash and blisters clean with mild soap and cool water or as told by your doctor.  Check your rash every day for signs of infection. These include redness, swelling, and pain that lasts or gets worse.  Do not pick your blisters.  Do not scratch your rash. General Instructions  Rest as told by your doctor.  Keep all follow-up visits as told by your doctor. This is important.  Until your blisters scab over, your infection can cause chickenpox in people who have never had it or been vaccinated against it. To prevent this from happening, avoid touching other people or  being around other people, especially:  Babies.  Pregnant women.  Children who have eczema.  Elderly people who have transplants.  People who have chronic illnesses, such as leukemia or AIDS. GET HELP IF:  Your pain does not get better with medicine.  Your pain does not get better after the rash heals.  Your rash looks infected. Signs of infection include:  Redness.  Swelling.  Pain that lasts or gets  worse. GET HELP RIGHT AWAY IF:  The rash is on your face or nose.  You have pain in your face, pain around your eye area, or loss of feeling on one side of your face.  You have ear pain or you have ringing in your ear.  You have loss of taste.  Your condition gets worse.   This information is not intended to replace advice given to you by your health care provider. Make sure you discuss any questions you have with your health care provider.   Document Released: 12/14/2007 Document Revised: 07/18/2014 Document Reviewed: 04/08/2014 Elsevier Interactive Patient Education [employer].

## 2016-03-01 NOTE — Progress Notes (Signed)
Patient ID: Autumn Walker, female    DOB: Jul 29, 1950, 65 y.o.   MRN: [id number]  PCP: Rene Paci, MD  Chief Complaint  Patient presents with  . Flank Pain    Radiates to back. x1week. Also c/o itching and burning x2weeks.     Subjective:   HPI Presents for evaluation of shingles outbreak.   Patient describes initially experiencing burning and itching under her right lateral arm and distributing in zoster like fashion to mid back times type 2 weeks ago.  She came in because now she is experiencing persistent pain and burning.  She has no vesicle eruptions or lesions.  Pain increases with light touch. She had a similar episode about 2 years ago, but the pain was managed with high dose ibuprofen.  Patient reports that she has not seen a medical provider in several years and has an appointment scheduled with a [place] provider on September 11th at another office.  . Social History   Social History  . Marital status: Married    Spouse name: N/A  . Number of children: N/A  . Years of education: N/A   Occupational History  . Not on file.   Social History Main Topics  . Smoking status: Current Every Day Smoker  . Smokeless tobacco: Not on file  . Alcohol use Not on file  . Drug use: Unknown  . Sexual activity: Not on file   Other Topics Concern  . Not on file   Social History Narrative  . No narrative on file    . Family History  Problem Relation Age of Onset  . Heart disease Father    Review of Systems  Respiratory: Negative.   Cardiovascular: Negative.   Skin:       See HPI  Neurological:       See HPI    Patient Active Problem List   Diagnosis Date Noted  . HYPERCHOLESTEROLEMIA 02/10/2009  . Type II or unspecified type diabetes mellitus without mention of complication, not stated as uncontrolled 03/10/2008  . HYPERLIPIDEMIA, MILD 03/10/2008  . DEPRESSION 03/10/2008  . DIVERTICULOSIS, COLON 03/10/2008  . Tobacco use disorder 09/07/2006      Prior to Admission medications   Medication Sig Start Date End Date Taking? Authorizing Provider  metFORMIN (GLUCOPHAGE) 1000 MG tablet Take 1,000 mg by mouth 2 (two) times daily with a meal.   Yes Historical Provider, MD     Allergies  Allergen Reactions  . Codeine      Objective:  Physical Exam  Constitutional: She is oriented to person, place, and time. She appears well-developed and well-nourished.  HENT:  Head: Normocephalic.  Eyes: Conjunctivae are normal. Pupils are equal, round, and reactive to light.  Cardiovascular: Normal rate, regular rhythm, normal heart sounds and intact distal pulses.   Pulmonary/Chest: Effort normal and breath sounds normal.  Neurological: She is alert and oriented to person, place, and time.  Skin: Skin is warm and dry.       . Vitals:   03/01/16 1018  BP: 116/76  Pulse: 100  Resp: 17  Temp: 97.5 F (36.4 C)   Assessment & Plan:  1. Shingles, likely without eruption. Patient described burning and itching in dermatomal fashion which was initially mild times 2 weeks ago. Pain now accompanies, itching and burning and there's no visible eruptions.  Patient had a similar episode a couple years ago that persisted for 6 weeks.  Plan: . acyclovir (ZOVIRAX) 200 MG capsule    Sig:  Take 1 capsule (200 mg total) by mouth 5 (five) times daily.    Dispense:  35 capsule  . gabapentin (NEURONTIN) 300 MG capsule    Sig: Take 1 capsule (300 mg total) by mouth 3 (three) times daily.    Follow-up as needed.  Godfrey Pick. Tiburcio Pea, MSN, FNP-C [facility]

## 2016-03-02 DIAGNOSIS — B029 Zoster without complications: Secondary | ICD-10-CM | POA: Insufficient documentation

## 2016-03-09 ENCOUNTER — Other Ambulatory Visit: Payer: Self-pay | Admitting: Family Medicine

## 2016-03-09 DIAGNOSIS — Z1231 Encounter for screening mammogram for malignant neoplasm of breast: Secondary | ICD-10-CM

## 2016-03-21 ENCOUNTER — Encounter: Payer: Self-pay | Admitting: Family Medicine

## 2016-03-21 ENCOUNTER — Ambulatory Visit (INDEPENDENT_AMBULATORY_CARE_PROVIDER_SITE_OTHER): Payer: Managed Care, Other (non HMO) | Admitting: Family Medicine

## 2016-03-21 VITALS — BP 119/67 | HR 90 | Ht 61.25 in | Wt 141.8 lb

## 2016-03-21 DIAGNOSIS — Z723 Lack of physical exercise: Secondary | ICD-10-CM

## 2016-03-21 DIAGNOSIS — Z9081 Acquired absence of spleen: Secondary | ICD-10-CM

## 2016-03-21 DIAGNOSIS — E782 Mixed hyperlipidemia: Secondary | ICD-10-CM | POA: Diagnosis not present

## 2016-03-21 DIAGNOSIS — B029 Zoster without complications: Secondary | ICD-10-CM

## 2016-03-21 DIAGNOSIS — E663 Overweight: Secondary | ICD-10-CM

## 2016-03-21 DIAGNOSIS — F1510 Other stimulant abuse, uncomplicated: Secondary | ICD-10-CM

## 2016-03-21 DIAGNOSIS — B0229 Other postherpetic nervous system involvement: Secondary | ICD-10-CM | POA: Insufficient documentation

## 2016-03-21 DIAGNOSIS — Z91199 Patient's noncompliance with other medical treatment and regimen due to unspecified reason: Secondary | ICD-10-CM | POA: Insufficient documentation

## 2016-03-21 DIAGNOSIS — K5730 Diverticulosis of large intestine without perforation or abscess without bleeding: Secondary | ICD-10-CM

## 2016-03-21 DIAGNOSIS — Z9119 Patient's noncompliance with other medical treatment and regimen: Secondary | ICD-10-CM

## 2016-03-21 DIAGNOSIS — E0810 Diabetes mellitus due to underlying condition with ketoacidosis without coma: Secondary | ICD-10-CM | POA: Diagnosis not present

## 2016-03-21 DIAGNOSIS — F17200 Nicotine dependence, unspecified, uncomplicated: Secondary | ICD-10-CM

## 2016-03-21 MED ORDER — GABAPENTIN 300 MG PO CAPS: ORAL_CAPSULE | ORAL | 0 refills | Status: DC

## 2016-03-21 NOTE — Assessment & Plan Note (Signed)
21 yrs dx. Told by Dr Loanne Drilling she needed them but pt refused many yrs ago. No clue what it is runnning now.

## 2016-03-21 NOTE — Patient Instructions (Signed)
When you start going up on your Neurontin dose always increase your nighttime dose first for a couple days and then 2-3 days later take 1 tablet in the morning and then 2 in the afternoon and 2 in the evening for a couple days and then couple days later take 2-2-2.   If you're her postherpetic neuralgia isn't under really good control next time we will give you Lidoderm patches.    Steps to Quit Smoking  Smoking tobacco can be harmful to your health and can affect almost every organ in your body. Smoking puts you, and those around you, at risk for developing many serious chronic diseases. Quitting smoking is difficult, but it is one of the best things that you can do for your health. It is never too late to quit. WHAT ARE THE BENEFITS OF QUITTING SMOKING? When you quit smoking, you lower your risk of developing serious diseases and conditions, such as:  Lung cancer or lung disease, such as COPD.  Heart disease.  Stroke.  Heart attack.  Infertility.  Osteoporosis and bone fractures. Additionally, symptoms such as coughing, wheezing, and shortness of breath may get better when you quit. You may also find that you get sick less often because your body is stronger at fighting off colds and infections. If you are pregnant, quitting smoking can help to reduce your chances of having a baby of low birth weight. HOW DO I GET READY TO QUIT? When you decide to quit smoking, create a plan to make sure that you are successful. Before you quit:  Pick a date to quit. Set a date within the next two weeks to give you time to prepare.  Write down the reasons why you are quitting. Keep this list in places where you will see it often, such as on your bathroom mirror or in your car or wallet.  Identify the people, places, things, and activities that make you want to smoke (triggers) and avoid them. Make sure to take these actions:  Throw away all cigarettes at home, at work, and in your car.  Throw away  smoking accessories, such as Scientist, research (medical).  Clean your car and make sure to empty the ashtray.  Clean your home, including curtains and carpets.  Tell your family, friends, and coworkers that you are quitting. Support from your loved ones can make quitting easier.  Talk with your health care provider about your options for quitting smoking.  Find out what treatment options are covered by your health insurance. WHAT STRATEGIES CAN I USE TO QUIT SMOKING?  Talk with your healthcare provider about different strategies to quit smoking. Some strategies include:  Quitting smoking altogether instead of gradually lessening how much you smoke over a period of time. Research shows that quitting "cold [place]" is more successful than gradually quitting.  Attending in-person counseling to help you build problem-solving skills. You are more likely to have success in quitting if you attend several counseling sessions. Even short sessions of 10 minutes can be effective.  Finding resources and support systems that can help you to quit smoking and remain smoke-free after you quit. These resources are most helpful when you use them often. They can include:  Online chats with a Social worker.  Telephone quitlines.  Printed Furniture conservator/restorer.  Support groups or group counseling.  Text messaging programs.  Mobile phone applications.  Taking medicines to help you quit smoking. (If you are pregnant or breastfeeding, talk with your health care provider first.) Some medicines  contain nicotine and some do not. Both types of medicines help with cravings, but the medicines that include nicotine help to relieve withdrawal symptoms. Your health care provider may recommend:  Nicotine patches, gum, or lozenges.  Nicotine inhalers or sprays.  Non-nicotine medicine that is taken by mouth. Talk with your health care provider about combining strategies, such as taking medicines while you are also receiving  in-person counseling. Using these two strategies together makes you more likely to succeed in quitting than if you used either strategy on its own. If you are pregnant or breastfeeding, talk with your health care provider about finding counseling or other support strategies to quit smoking. Do not take medicine to help you quit smoking unless told to do so by your health care provider. WHAT THINGS CAN I DO TO MAKE IT EASIER TO QUIT? Quitting smoking might feel overwhelming at first, but there is a lot that you can do to make it easier. Take these important actions:  Reach out to your family and friends and ask that they support and encourage you during this time. Call telephone quitlines, reach out to support groups, or work with a counselor for support.  Ask people who smoke to avoid smoking around you.  Avoid places that trigger you to smoke, such as bars, parties, or smoke-break areas at work.  Spend time around people who do not smoke.  Lessen stress in your life, because stress can be a smoking trigger for some people. To lessen stress, try:  Exercising regularly.  Deep-breathing exercises.  Yoga.  Meditating.  Performing a body scan. This involves closing your eyes, scanning your body from head to toe, and noticing which parts of your body are particularly tense. Purposefully relax the muscles in those areas.  Download or purchase mobile phone or tablet apps (applications) that can help you stick to your quit plan by providing reminders, tips, and encouragement. There are many free apps, such as QuitGuide from the [employer] Office manager for Disease Control and Prevention). You can find other support for quitting smoking (smoking cessation) through smokefree.gov and other websites. HOW WILL I FEEL WHEN I QUIT SMOKING? Within the first 24 hours of quitting smoking, you may start to feel some withdrawal symptoms. These symptoms are usually most noticeable 2-3 days after quitting, but they usually  do not last beyond 2-3 weeks. Changes or symptoms that you might experience include:  Mood swings.  Restlessness, anxiety, or irritation.  Difficulty concentrating.  Dizziness.  Strong cravings for sugary foods in addition to nicotine.  Mild weight gain.  Constipation.  Nausea.  Coughing or a sore throat.  Changes in how your medicines work in your body.  A depressed mood.  Difficulty sleeping (insomnia). After the first 2-3 weeks of quitting, you may start to notice more positive results, such as:  Improved sense of smell and taste.  Decreased coughing and sore throat.  Slower heart rate.  Lower blood pressure.  Clearer skin.  The ability to breathe more easily.  Fewer sick days. Quitting smoking is very challenging for most people. Do not get discouraged if you are not successful the first time. Some people need to make many attempts to quit before they achieve long-term success. Do your best to stick to your quit plan, and talk with your health care provider if you have any questions or concerns.   This information is not intended to replace advice given to you by your health care provider. Make sure you discuss any questions you  have with your health care provider.   Document Released: 06/21/2001 Document Revised: 11/11/2014 Document Reviewed: 11/11/2014 Elsevier Interactive Patient Education 2016 [employer].     Smoking Cessation, Tips for Success If you are ready to quit smoking, congratulations! You have chosen to help yourself be healthier. Cigarettes bring nicotine, tar, carbon monoxide, and other irritants into your body. Your lungs, heart, and blood vessels will be able to work better without these poisons. There are many different ways to quit smoking. Nicotine gum, nicotine patches, a nicotine inhaler, or nicotine nasal spray can help with physical craving. Hypnosis, support groups, and medicines help break the habit of smoking. WHAT THINGS CAN I DO  TO MAKE QUITTING EASIER?  Here are some tips to help you quit for good:  Pick a date when you will quit smoking completely. Tell all of your friends and family about your plan to quit on that date.  Do not try to slowly cut down on the number of cigarettes you are smoking. Pick a quit date and quit smoking completely starting on that day.  Throw away all cigarettes.   Clean and remove all ashtrays from your home, work, and car.  On a card, write down your reasons for quitting. Carry the card with you and read it when you get the urge to smoke.  Cleanse your body of nicotine. Drink enough water and fluids to keep your urine clear or pale yellow. Do this after quitting to flush the nicotine from your body.  Learn to predict your moods. Do not let a bad situation be your excuse to have a cigarette. Some situations in your life might tempt you into wanting a cigarette.  Never have "just one" cigarette. It leads to wanting another and another. Remind yourself of your decision to quit.  Change habits associated with smoking. If you smoked while driving or when feeling stressed, try other activities to replace smoking. Stand up when drinking your coffee. Brush your teeth after eating. Sit in a different chair when you read the paper. Avoid alcohol while trying to quit, and try to drink fewer caffeinated beverages. Alcohol and caffeine may urge you to smoke.  Avoid foods and drinks that can trigger a desire to smoke, such as sugary or spicy foods and alcohol.  Ask people who smoke not to smoke around you.  Have something planned to do right after eating or having a cup of coffee. For example, plan to take a walk or exercise.  Try a relaxation exercise to calm you down and decrease your stress. Remember, you may be tense and nervous for the first 2 weeks after you quit, but this will pass.  Find new activities to keep your hands busy. Play with a pen, coin, or rubber band. Doodle or draw things  on paper.  Brush your teeth right after eating. This will help cut down on the craving for the taste of tobacco after meals. You can also try mouthwash.   Use oral substitutes in place of cigarettes. Try using lemon drops, carrots, cinnamon sticks, or chewing gum. Keep them handy so they are available when you have the urge to smoke.  When you have the urge to smoke, try deep breathing.  Designate your home as a nonsmoking area.  If you are a heavy smoker, ask your health care provider about a prescription for nicotine chewing gum. It can ease your withdrawal from nicotine.  Reward yourself. Set aside the cigarette money you save and buy yourself  something nice.  Look for support from others. Join a support group or smoking cessation program. Ask someone at home or at work to help you with your plan to quit smoking.  Always ask yourself, "Do I need this cigarette or is this just a reflex?" Tell yourself, "Today, I choose not to smoke," or "I do not want to smoke." You are reminding yourself of your decision to quit.  Do not replace cigarette smoking with electronic cigarettes (commonly called e-cigarettes). The safety of e-cigarettes is unknown, and some may contain harmful chemicals.  If you relapse, do not give up! Plan ahead and think about what you will do the next time you get the urge to smoke. HOW WILL I FEEL WHEN I QUIT SMOKING? You may have symptoms of withdrawal because your body is used to nicotine (the addictive substance in cigarettes). You may crave cigarettes, be irritable, feel very hungry, cough often, get headaches, or have difficulty concentrating. The withdrawal symptoms are only temporary. They are strongest when you first quit but will go away within 10-14 days. When withdrawal symptoms occur, stay in control. Think about your reasons for quitting. Remind yourself that these are signs that your body is healing and getting used to being without cigarettes. Remember that  withdrawal symptoms are easier to treat than the major diseases that smoking can cause.  Even after the withdrawal is over, expect periodic urges to smoke. However, these cravings are generally short lived and will go away whether you smoke or not. Do not smoke! WHAT RESOURCES ARE AVAILABLE TO HELP ME QUIT SMOKING? Your health care provider can direct you to community resources or hospitals for support, which may include:  Group support.  Education.  Hypnosis.  Therapy.   This information is not intended to replace advice given to you by your health care provider. Make sure you discuss any questions you have with your health care provider.   Document Released: 03/25/2004 Document Revised: 07/18/2014 Document Reviewed: 12/13/2012 Elsevier Interactive Patient Education [employer].

## 2016-03-21 NOTE — Assessment & Plan Note (Signed)
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

## 2016-03-21 NOTE — Assessment & Plan Note (Addendum)
11-12 yrs now.    FBS---> [address]  On 1000 bid of 12 hr metformin. - buys it online.   - used to be on [place] also- by Dr. Ebony Hail, made her gain too much weight.  So she stopped the medicine and stopped going to the doctor  No vis changes, foot problems, peripheral neuro complaints.  Has not had her blood work done in many years  Last seen by Dr. Loanne Drilling, endocrinology in 2012

## 2016-03-21 NOTE — Assessment & Plan Note (Addendum)
>>  ASSESSMENT AND PLAN FOR TOBACCO USE DISORDER WRITTEN ON 03/21/2016  9:59 AM BY Latesia Norrington, DO  30+ pk yr hx.  >>ASSESSMENT AND PLAN FOR CURRENT EVERY WEEK SMOKER- 30 PK YR HX- QUIT OCT 2018; 3-4 CIGARETTES/WEEK OR LESS WRITTEN ON 03/21/2016  9:52 AM BY Brilee Port, DO  Husband quit smoking 3 yrs ago. And pt would like to
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
## 2016-03-21 NOTE — Assessment & Plan Note (Addendum)
No med insurance for long time and felt well so didn't go.  - She tells me today she is ready to get healthy because she realizes she is older and tells me she will listen to what I recommend

## 2016-03-21 NOTE — Assessment & Plan Note (Signed)
Discussed with patient that I can add Lidoderm patch in future if needed.

## 2016-03-21 NOTE — Assessment & Plan Note (Signed)
Husband quit smoking 3 yrs ago. And pt would like to

## 2016-03-21 NOTE — Assessment & Plan Note (Signed)
Last colonoscopy 53yr ago or so, was told repeat 5 yrs. Lost to f/up

## 2016-03-21 NOTE — Assessment & Plan Note (Signed)
>>  ASSESSMENT AND PLAN FOR HYPERLIPIDEMIA ASSOCIATED WITH TYPE 2 DIABETES MELLITUS ([place]) WRITTEN ON 03/21/2016  9:58 AM BY [name], Shailene, DO  21 yrs dx. Told by Dr Loanne Drilling she needed them but pt refused many yrs ago. No clue what it is runnning now.

## 2016-03-21 NOTE — Assessment & Plan Note (Addendum)
Doesn't exercise  Counseling done

## 2016-03-21 NOTE — Assessment & Plan Note (Addendum)
3 weeks ago today she was treated and placed on Neurontin as well as treated with an antiviral. She still has some burning with that. They never increased her dose.  No s-e of meds- tolerating well.

## 2016-03-28 ENCOUNTER — Ambulatory Visit
Admission: RE | Admit: 2016-03-28 | Discharge: 2016-03-28 | Disposition: A | Discharge status: Home / Self Care | Payer: Managed Care, Other (non HMO) | Source: Ambulatory Visit | Attending: Family Medicine | Admitting: Family Medicine

## 2016-03-28 DIAGNOSIS — Z1231 Encounter for screening mammogram for malignant neoplasm of breast: Secondary | ICD-10-CM

## 2016-03-30 ENCOUNTER — Other Ambulatory Visit (INDEPENDENT_AMBULATORY_CARE_PROVIDER_SITE_OTHER): Payer: Managed Care, Other (non HMO)

## 2016-03-30 DIAGNOSIS — B029 Zoster without complications: Secondary | ICD-10-CM | POA: Diagnosis not present

## 2016-03-30 DIAGNOSIS — E782 Mixed hyperlipidemia: Secondary | ICD-10-CM

## 2016-03-30 DIAGNOSIS — B0229 Other postherpetic nervous system involvement: Secondary | ICD-10-CM

## 2016-03-30 DIAGNOSIS — Z723 Lack of physical exercise: Secondary | ICD-10-CM

## 2016-03-30 DIAGNOSIS — Z91199 Patient's noncompliance with other medical treatment and regimen due to unspecified reason: Secondary | ICD-10-CM

## 2016-03-30 DIAGNOSIS — Z9119 Patient's noncompliance with other medical treatment and regimen: Secondary | ICD-10-CM

## 2016-03-30 DIAGNOSIS — F17200 Nicotine dependence, unspecified, uncomplicated: Secondary | ICD-10-CM

## 2016-03-30 DIAGNOSIS — E0810 Diabetes mellitus due to underlying condition with ketoacidosis without coma: Secondary | ICD-10-CM

## 2016-03-30 DIAGNOSIS — Z9081 Acquired absence of spleen: Secondary | ICD-10-CM

## 2016-03-30 LAB — POCT UA - MICROALBUMIN
Albumin/Creatinine Ratio, Urine, POC: <30
Creatinine, POC: 100 mg/dL
MICROALBUMIN (UR) POC: 10 mg/L

## 2016-03-31 LAB — COMPLETE METABOLIC PANEL WITH GFR
ALBUMIN: 4.7 g/dL (ref 3.6–5.1)
ALK PHOS: 64 U/L (ref 33–130)
ALT: 16 U/L (ref 6–29)
AST: 18 U/L (ref 10–35)
BILIRUBIN TOTAL: 0.3 mg/dL (ref 0.2–1.2)
BUN: 16 mg/dL (ref 7–25)
CO2: 26 mmol/L (ref 20–31)
CREATININE: 0.67 mg/dL (ref 0.50–0.99)
Calcium: 9.8 mg/dL (ref 8.6–10.4)
Chloride: 102 mmol/L (ref 98–110)
GLUCOSE: 113 mg/dL — AB (ref 65–99)
Potassium: 4.6 mmol/L (ref 3.5–5.3)
Sodium: 138 mmol/L (ref 135–146)
TOTAL PROTEIN: 7.5 g/dL (ref 6.1–8.1)

## 2016-03-31 LAB — CBC WITH DIFFERENTIAL/PLATELET
BASOS ABS: 145 {cells}/uL (ref 0–200)
BASOS PCT: 1 %
EOS ABS: 725 {cells}/uL — AB (ref 15–500)
Eosinophils Relative: 5 %
HCT: 44.9 % (ref 35.0–45.0)
HEMOGLOBIN: 15.4 g/dL (ref 11.7–15.5)
LYMPHS ABS: 6525 {cells}/uL — AB (ref 850–3900)
Lymphocytes Relative: 45 %
MCH: 32.1 pg (ref 27.0–33.0)
MCHC: 34.3 g/dL (ref 32.0–36.0)
MCV: 93.5 fL (ref 80.0–100.0)
MONO ABS: 1450 {cells}/uL — AB (ref 200–950)
MPV: 10.1 fL (ref 7.5–12.5)
Monocytes Relative: 10 %
NEUTROS ABS: 5655 {cells}/uL (ref 1500–7800)
Neutrophils Relative %: 39 %
PLATELETS: 395 10*3/uL (ref 140–400)
RBC: 4.8 MIL/uL (ref 3.80–5.10)
RDW: 13.8 % (ref 11.0–15.0)
WBC: 14.5 10*3/uL — ABNORMAL HIGH (ref 3.8–10.8)

## 2016-03-31 LAB — LIPID PANEL
Cholesterol: 253 mg/dL — ABNORMAL HIGH (ref 125–200)
HDL: 36 mg/dL — AB (ref >=46)
LDL CALC: 139 mg/dL — AB (ref <130)
TRIGLYCERIDES: 391 mg/dL — AB (ref <150)
Total CHOL/HDL Ratio: 7 Ratio — ABNORMAL HIGH (ref <=5.0)
VLDL: 78 mg/dL — AB (ref <30)

## 2016-03-31 LAB — VITAMIN B12: Vitamin B-12: 429 pg/mL (ref 200–1100)

## 2016-03-31 LAB — VITAMIN D 25 HYDROXY (VIT D DEFICIENCY, FRACTURES): Vit D, 25-Hydroxy: 13 ng/mL — ABNORMAL LOW (ref 30–100)

## 2016-03-31 LAB — HEMOGLOBIN A1C
Hgb A1c MFr Bld: 6.5 % — ABNORMAL HIGH (ref <5.7)
Mean Plasma Glucose: 140 mg/dL

## 2016-03-31 LAB — HEPATITIS C ANTIBODY: HCV AB: NEGATIVE

## 2016-03-31 LAB — T4, FREE: Free T4: 1.1 ng/dL (ref 0.8–1.8)

## 2016-03-31 LAB — TSH: TSH: 2.45 m[IU]/L

## 2016-04-05 ENCOUNTER — Encounter: Payer: Self-pay | Admitting: Gastroenterology

## 2016-04-05 ENCOUNTER — Other Ambulatory Visit: Payer: Self-pay

## 2016-04-05 DIAGNOSIS — Z1211 Encounter for screening for malignant neoplasm of colon: Secondary | ICD-10-CM

## 2016-04-05 NOTE — Progress Notes (Signed)
Pt requested referral for screening colonoscopy.  Referral placed.  Charyl Bigger, CMA

## 2016-04-18 ENCOUNTER — Ambulatory Visit (INDEPENDENT_AMBULATORY_CARE_PROVIDER_SITE_OTHER): Payer: Managed Care, Other (non HMO) | Admitting: Family Medicine

## 2016-04-18 ENCOUNTER — Encounter: Payer: Self-pay | Admitting: Family Medicine

## 2016-04-18 VITALS — BP 115/72 | HR 96 | Ht 61.25 in | Wt 145.6 lb

## 2016-04-18 DIAGNOSIS — F17200 Nicotine dependence, unspecified, uncomplicated: Secondary | ICD-10-CM

## 2016-04-18 DIAGNOSIS — E559 Vitamin D deficiency, unspecified: Secondary | ICD-10-CM

## 2016-04-18 DIAGNOSIS — E0800 Diabetes mellitus due to underlying condition with hyperosmolarity without nonketotic hyperglycemic-hyperosmolar coma (NKHHC): Secondary | ICD-10-CM

## 2016-04-18 DIAGNOSIS — K5909 Other constipation: Secondary | ICD-10-CM | POA: Insufficient documentation

## 2016-04-18 DIAGNOSIS — B0229 Other postherpetic nervous system involvement: Secondary | ICD-10-CM

## 2016-04-18 DIAGNOSIS — E7800 Pure hypercholesterolemia, unspecified: Secondary | ICD-10-CM | POA: Diagnosis not present

## 2016-04-18 DIAGNOSIS — Z23 Encounter for immunization: Secondary | ICD-10-CM

## 2016-04-18 DIAGNOSIS — J438 Other emphysema: Secondary | ICD-10-CM

## 2016-04-18 DIAGNOSIS — E663 Overweight: Secondary | ICD-10-CM

## 2016-04-18 DIAGNOSIS — Z91199 Patient's noncompliance with other medical treatment and regimen due to unspecified reason: Secondary | ICD-10-CM

## 2016-04-18 DIAGNOSIS — Z9119 Patient's noncompliance with other medical treatment and regimen: Secondary | ICD-10-CM | POA: Diagnosis not present

## 2016-04-18 DIAGNOSIS — K5730 Diverticulosis of large intestine without perforation or abscess without bleeding: Secondary | ICD-10-CM

## 2016-04-18 DIAGNOSIS — E782 Mixed hyperlipidemia: Secondary | ICD-10-CM | POA: Diagnosis not present

## 2016-04-18 MED ORDER — BUDESONIDE-FORMOTEROL FUMARATE 160-4.5 MCG/ACT IN AERO: 2.0000 | INHALATION_SPRAY | Freq: Two times a day (BID) | RESPIRATORY_TRACT | 3 refills | Status: DC

## 2016-04-18 MED ORDER — EZETIMIBE 10 MG PO TABS: 10.0000 mg | ORAL_TABLET | Freq: Every day | ORAL | 3 refills | Status: DC

## 2016-04-18 MED ORDER — GABAPENTIN 300 MG PO CAPS: ORAL_CAPSULE | ORAL | 2 refills | Status: DC

## 2016-04-18 MED ORDER — VITAMIN D (ERGOCALCIFEROL) 1.25 MG (50000 UNIT) PO CAPS: 50000.0000 [IU] | ORAL_CAPSULE | ORAL | 10 refills | Status: DC

## 2016-04-18 MED ORDER — METFORMIN HCL ER 500 MG PO TB24: 1000.0000 mg | ORAL_TABLET | Freq: Every day | ORAL | 1 refills | Status: DC

## 2016-04-18 MED ORDER — ATORVASTATIN CALCIUM 80 MG PO TABS: 80.0000 mg | ORAL_TABLET | Freq: Every day | ORAL | 3 refills | Status: DC

## 2016-04-18 NOTE — Patient Instructions (Addendum)
Please realize, EXERCISE IS MEDICINE!  -  American Heart Association [facility]) guidelines for exercise : If you are in good health, without any medical conditions, you should engage in 150 minutes of moderate intensity aerobic activity per week.  This means you should be huffing and puffing throughout your workout.   Engaging in regular exercise will improve brain function and memory, as well as improve mood, boost immune system and help with weight management.  As well as the other, more well-known effects of exercise such as decreasing blood sugar levels, decreasing blood pressure,  and decreasing bad cholesterol levels/ increasing good cholesterol levels.     -  The AHA strongly endorses consumption of a diet that contains a variety of foods from all the food categories with an emphasis on fruits and vegetables; fat-free and low-fat dairy products; cereal and grain products; legumes and nuts; and fish, poultry, and/or extra lean meats.    Excessive food intake, especially of foods high in saturated and trans fats, sugar, and salt, should be avoided.    Adequate water intake of roughly 1/2 of your weight in pounds, should equal the ounces of water per day you should drink.  So for instance, if you're 200 pounds, that would be 100 ounces of water per day.         Mediterranean Diet  Why follow it? Research shows. . Those who follow the Mediterranean diet have a reduced risk of heart disease  . The diet is associated with a reduced incidence of Parkinson's and Alzheimer's diseases . People following the diet may have longer life expectancies and lower rates of chronic diseases  . The Dietary Guidelines for Americans recommends the Mediterranean diet as an eating plan to promote health and prevent disease  What Is the Mediterranean Diet?  . Healthy eating plan based on typical foods and recipes of Mediterranean-style cooking . The diet is primarily a plant based diet; these foods should make up a  majority of meals   Starches - Plant based foods should make up a majority of meals - They are an important sources of vitamins, minerals, energy, antioxidants, and fiber - Choose whole grains, foods high in fiber and minimally processed items  - Typical grain sources include wheat, oats, barley, corn, brown rice, bulgar, farro, millet, polenta, couscous  - Various types of beans include chickpeas, lentils, fava beans, black beans, white beans   Fruits  Veggies - Large quantities of antioxidant rich fruits & veggies; 6 or more servings  - Vegetables can be eaten raw or lightly drizzled with oil and cooked  - Vegetables common to the traditional Mediterranean Diet include: artichokes, arugula, beets, broccoli, brussel sprouts, cabbage, carrots, celery, collard greens, cucumbers, eggplant, kale, leeks, lemons, lettuce, mushrooms, okra, onions, peas, peppers, potatoes, pumpkin, radishes, rutabaga, shallots, spinach, sweet potatoes, turnips, zucchini - Fruits common to the Mediterranean Diet include: apples, apricots, avocados, cherries, clementines, dates, figs, grapefruits, grapes, melons, nectarines, oranges, peaches, pears, pomegranates, strawberries, tangerines  Fats - Replace butter and margarine with healthy oils, such as olive oil, canola oil, and tahini  - Limit nuts to no more than a handful a day  - Nuts include walnuts, almonds, pecans, pistachios, pine nuts  - Limit or avoid candied, honey roasted or heavily salted nuts - Olives are central to the Mediterranean diet - can be eaten whole or used in a variety of dishes   Meats Protein - Limiting red meat: no more than a few times a month -  When eating red meat: choose lean cuts and keep the portion to the size of deck of cards - Eggs: approx. 0 to 4 times a week  - Fish and lean poultry: at least 2 a week  - Healthy protein sources include, chicken, [place], lean beef, lamb - Increase intake of seafood such as tuna, salmon, trout,  mackerel, shrimp, scallops - Avoid or limit high fat processed meats such as sausage and bacon  Dairy - Include moderate amounts of low fat dairy products  - Focus on healthy dairy such as fat free yogurt, skim milk, low or reduced fat cheese - Limit dairy products higher in fat such as whole or 2% milk, cheese, ice cream  Alcohol - Moderate amounts of red wine is ok  - No more than 5 oz daily for women (all ages) and men older than age 72  - No more than 10 oz of wine daily for men younger than 12  Other - Limit sweets and other desserts  - Use herbs and spices instead of salt to flavor foods  - Herbs and spices common to the traditional Mediterranean Diet include: basil, bay leaves, chives, cloves, cumin, fennel, garlic, lavender, marjoram, mint, oregano, parsley, pepper, rosemary, sage, savory, sumac, tarragon, thyme   It's not just a diet, it's a lifestyle:  . The Mediterranean diet includes lifestyle factors typical of those in the region  . Foods, drinks and meals are best eaten with others and savored . Daily physical activity is important for overall good health . This could be strenuous exercise like running and aerobics . This could also be more leisurely activities such as walking, housework, yard-work, or taking the stairs . Moderation is the key; a balanced and healthy diet accommodates most foods and drinks . Consider portion sizes and frequency of consumption of certain foods   Meal Ideas & Options:  . Breakfast:  o Whole wheat toast or whole wheat English muffins with peanut butter & hard boiled egg o Steel cut oats topped with apples & cinnamon and skim milk  o Fresh fruit: banana, strawberries, melon, berries, peaches  o Smoothies: strawberries, bananas, greek yogurt, peanut butter o Low fat greek yogurt with blueberries and granola  o Egg white omelet with spinach and mushrooms o Breakfast couscous: whole wheat couscous, apricots, skim milk, cranberries  . Sandwiches:   o Hummus and grilled vegetables (peppers, zucchini, squash) on whole wheat bread   o Grilled chicken on whole wheat pita with lettuce, tomatoes, cucumbers or tzatziki  o Tuna salad on whole wheat bread: tuna salad made with greek yogurt, olives, red peppers, capers, green onions o Garlic rosemary lamb pita: lamb sauted with garlic, rosemary, salt & pepper; add lettuce, cucumber, greek yogurt to pita - flavor with lemon juice and black pepper  . Seafood:  o Mediterranean grilled salmon, seasoned with garlic, basil, parsley, lemon juice and black pepper o Shrimp, lemon, and spinach whole-grain pasta salad made with low fat greek yogurt  o Seared scallops with lemon orzo  o Seared tuna steaks seasoned salt, pepper, coriander topped with tomato mixture of olives, tomatoes, olive oil, minced garlic, parsley, green onions and cappers  . Meats:  o Herbed greek chicken salad with kalamata olives, cucumber, feta  o Red bell peppers stuffed with spinach, bulgur, lean ground beef (or lentils) & topped with feta   o Kebabs: skewers of chicken, tomatoes, onions, zucchini, squash  o [place] burgers: made with red onions, mint, dill, lemon juice, feta  cheese topped with roasted red peppers . Vegetarian o Cucumber salad: cucumbers, artichoke hearts, celery, red onion, feta cheese, tossed in olive oil & lemon juice  o Hummus and whole grain pita points with a greek salad (lettuce, tomato, feta, olives, cucumbers, red onion) o Lentil soup with celery, carrots made with vegetable broth, garlic, salt and pepper  o Tabouli salad: parsley, bulgur, mint, scallions, cucumbers, tomato, radishes, lemon juice, olive oil, salt and pepper.    Use daily supplements of at least 1200 mg of calcium per day and 5000 IUs vitamin D3 daily. You get these both over-the-counter. In addition also get the vitamin D once weekly tablet from the pharmacy and take that as well.      Use MiraLAX daily with at least one half of your  weight in ounces of water per day. If you want to really clear cell file, you can use magnesium citrate. If you feel your stools are hard you could use a medicine over-the-counter such as Peri-Colace   Constipation, Adult Constipation is when a person has fewer than three bowel movements a week, has difficulty having a bowel movement, or has stools that are dry, hard, or larger than normal. As people grow older, constipation is more common. A low-fiber diet, not taking in enough fluids, and taking certain medicines may make constipation worse.  CAUSES   Certain medicines, such as antidepressants, pain medicine, iron supplements, antacids, and water pills.   Certain diseases, such as diabetes, irritable bowel syndrome (IBS), thyroid disease, or depression.   Not drinking enough water.   Not eating enough fiber-rich foods.   Stress or travel.   Lack of physical activity or exercise.   Ignoring the urge to have a bowel movement.   Using laxatives too much.  SIGNS AND SYMPTOMS   Having fewer than three bowel movements a week.   Straining to have a bowel movement.   Having stools that are hard, dry, or larger than normal.   Feeling full or bloated.   Pain in the lower abdomen.   Not feeling relief after having a bowel movement.  DIAGNOSIS  Your health care provider will take a medical history and perform a physical exam. Further testing may be done for severe constipation. Some tests may include:  A barium enema X-ray to examine your rectum, colon, and, sometimes, your small intestine.   A sigmoidoscopy to examine your lower colon.   A colonoscopy to examine your entire colon. TREATMENT  Treatment will depend on the severity of your constipation and what is causing it. Some dietary treatments include drinking more fluids and eating more fiber-rich foods. Lifestyle treatments may include regular exercise. If these diet and lifestyle recommendations do not help,  your health care provider may recommend taking over-the-counter laxative medicines to help you have bowel movements. Prescription medicines may be prescribed if over-the-counter medicines do not work.  HOME CARE INSTRUCTIONS   Eat foods that have a lot of fiber, such as fruits, vegetables, whole grains, and beans.  Limit foods high in fat and processed sugars, such as french fries, hamburgers, cookies, candies, and soda.   A fiber supplement may be added to your diet if you cannot get enough fiber from foods.   Drink enough fluids to keep your urine clear or pale yellow.   Exercise regularly or as directed by your health care provider.   Go to the restroom when you have the urge to go. Do not hold it.   Only take  over-the-counter or prescription medicines as directed by your health care provider. Do not take other medicines for constipation without talking to your health care provider first.  [place] IF:   You have bright red blood in your stool.   Your constipation lasts for more than 4 days or gets worse.   You have abdominal or rectal pain.   You have thin, pencil-like stools.   You have unexplained weight loss. MAKE SURE YOU:   Understand these instructions.  Will watch your condition.  Will get help right away if you are not doing well or get worse.   This information is not intended to replace advice given to you by your health care provider. Make sure you discuss any questions you have with your health care provider.   Document Released: 03/25/2004 Document Revised: 07/18/2014 Document Reviewed: 04/08/2013 Elsevier Interactive Patient Education [employer].   For those diagnosed with high triglycerides, it's important to take action to lower your levels and improve your heart health.  Triglyceride is just a fancy word for fat - the fat in our bodies is stored in the form of triglycerides. Triglycerides are found in foods and  manufactured in our bodies.  Normal triglyceride levels are defined as less than 150 mg/dL; 150 to 199 is considered borderline high; 200 to 499 is high; and 500 or higher is officially called very high. To me, anything over 150 is a red flag indicating my patient needs to take immediate steps to get the situation under control.   What is the significance of high triglycerides? High triglyceride levels make blood thicker and stickier, which means that it is more likely to form clots. Studies have shown that triglyceride levels are associated with increased risks of cardiovascular disease and stroke - in both men and women - alone or in combination with other risk factors (high triglycerides combined with high LDL cholesterol can be a particularly deadly combination). For example, in one ground-breaking study, high triglycerides alone increased the risk of cardiovascular disease by 14 percent in men, and by 83 percent in women. But when the test subjects also had low HDL cholesterol (that's the good cholesterol) and other risk factors, high triglycerides increased the risk of disease by 32 percent in men and 76 percent in women.   Fortunately, triglycerides can sometimes be controlled with several diet and lifestyle changes.    What Factors Can Increase Triglycerides? As with cholesterol, eating too much of the wrong kinds of fats will raise your blood triglycerides.  Therefore, it's important to restrict the amounts of saturated fats and trans fats you allow into your diet.  Triglyceride levels can also shoot up after eating foods that are high in carbohydrates or after drinking alcohol.  That's why triglyceride blood tests require an overnight fast.  If you have elevated triglycerides, it's especially important to avoid sugary and refined carbohydrates, including sugar, honey, and other sweeteners, soda and other sugary drinks, candy, baked goods, and anything made with white (refined or enriched) flour,  including white bread, rolls, cereals, buns, pastries, regular pasta, and white rice.  You'll also want to limit dried fruit and fruit juice since they're dense in simple sugar.  All of these low-quality carbs cause a sudden rise in insulin, which may lead to a spike in triglycerides.  Triglycerides can also become elevated as a reaction to having diabetes, hypothyroidism, or kidney disease. As with most other heart-related factors, being overweight and inactive also contribute to  abnormal triglycerides. And unfortunately, some people have a genetic predisposition that causes them to manufacture way too much triglycerides on their own, no matter how carefully they eat.   How Can You Lower Your Triglyceride Levels? If you are diagnosed with high triglycerides, it's important to take action. There are several things you can do to help lower your triglyceride levels and improve your heart health:  Lose weight if you are overweight.  There is a clear correlation between obesity and high triglycerides - the heavier people are, the higher their triglyceride levels are likely to be. The good news is that losing weight can significantly lower triglycerides. In a large study of individuals with type 2 diabetes, those assigned to the "lifestyle intervention group" - which involved counseling, a low-calorie meal plan, and customized exercise program - lost 8.6% of their body weight and lowered their triglyceride levels by more than 16%. If you're overweight, find a weight loss plan that works for you and commit to shedding the pounds and getting healthier.  Reduce the amount of saturated fat and trans fat in your diet.  Start by avoiding or dramatically limiting butter, cream cheese, lard, sour cream, doughnuts, cakes, cookies, candy bars, regular ice cream, fried foods, pizza, cheese sauce, cream-based sauces and salad dressings, high-fat meats (including fatty hamburgers, bologna, pepperoni, sausage, bacon,  salami, pastrami, spareribs, and hot dogs), high-fat cuts of beef and pork, and whole-milk dairy products.   Other ways to cut back: Choose lean meats only (including skinless chicken and [place], lean beef, lean pork), fish, and reduced-fat or fat-free dairy products.   Experiment with adding whole soy foods to your diet. Although soy itself may not reduce risk of heart disease, it replaces hazardous animal fats with healthier proteins. Choose high-quality soy foods, such as tofu, tempeh, soy milk, and edamame (whole soybeans).  Always remove skin from poultry.  Prepare foods by baking, roasting, broiling, boiling, poaching, steaming, grilling, or stir-frying in vegetable oil.  Most stick margarines contain trans fats, and trans fats are also found in some packaged baked goods, potato chips, snack foods, fried foods, and fast food that use or create hydrogenated oils.    (All food labels must now list the amount of trans fats, right after the amount of saturated fats - good news for consumers. As a result, many food companies have now reformulated their products to be trans fat free.many, but not all! So it's still just as important to read labels and make sure the packaged foods you buy don't contain trans fats.)     If you use margarine, purchase soft-tub margarine spreads that contain 0 grams trans fats and don't list any partially hydrogenated oils in the ingredients list. By substituting olive oil or vegetable oil for trans fats in just 2 percent of your daily calories, you can reduce your risk of heart disease by 53 percent.   There is no safe amount of trans fats, so try to keep them as far from your plate as possible.  Avoid foods that are concentrated in sugar (even dried fruit and fruit juice). Sugary foods can elevate triglyceride levels in the blood, so keep them to a bare minimum.  Swap out refined carbohydrates for whole grains.  Refined carbohydrates - like white rice, regular pasta,  and anything made with white or "enriched" flour (including white bread, rolls, cereals, buns, and crackers) - raise blood sugar and insulin levels more than fiber-rich whole grains. Higher insulin levels, in turn, can lead  to a higher rise in triglycerides after a meal. So, make the switch to whole wheat bread, whole grain pasta, brown or wild rice, and whole grain versions of cereals, crackers, and other bread products. However, it's important to know that individuals with high triglycerides should moderate even their intake of high-quality starches (since all starches raise blood sugar) - I recommend 1 to 2 servings per meal.  Cut way back on alcohol.  If you have high triglycerides, alcohol should be considered a rare treat - if you indulge at all, since even small amounts of alcohol can dramatically increase triglyceride levels.  Incorporate omega-3 fats.  Heart-healthy fish oils are especially rich in omega-3 fatty acids. In multiple studies over the past two decades, people who ate diets high in omega-3s had 30 to 40 percent reductions in heart disease. Although we don't yet know why fish oil works so well, there are several possibilities. Omega-3s seem to reduce inflammation, reduce high blood pressure, decrease triglycerides, raise HDL cholesterol, and make blood thinner and less sticky so it is less likely to clot. It's as close to a food prescription for heart health as it gets. If you have high triglycerides, I recommend eating at least three servings of one of the omega-3-rich fish every week (fatty fish is the most concentrated food form of omega three fats). If you cannot manage to eat that much fish, speak with your physician about taking fish oil capsules, which offer similar benefits.The best foods for omega-3 fatty acids include wild salmon (fresh, canned), herring, mackerel (not king), sardines, anchovies, rainbow trout, and Pacific oysters. Non-fish sources of omega-3 fats include  omega-3-fortified eggs, ground flaxseed, chia seeds, walnuts, butternuts (white walnuts), seaweed, walnut oil, canola oil, and soybeans.  Quit smoking.  Smoking causes inflammation, not just in your lungs, but throughout your body. Inflammation can contribute to atherosclerosis, blood clots, and risk of heart attack. Smoking makes all heart health indicators worse. If you have high cholesterol, high triglycerides, or high blood pressure, smoking magnifies the danger.  Become more physically active.  Even moderate exercise can help improve cholesterol, triglycerides, and blood pressure. Aerobic exercise seems to be able to stop the sharp rise of triglycerides after eating, perhaps because of a decrease in the amount of triglyceride released by the liver, or because active muscle clears triglycerides out of the blood stream more quickly than inactive muscle. If you haven't exercised regularly (or at all) for years, I recommend starting slowly, by walking at an easy pace for 15 minutes a day. Then, as you feel more comfortable, increase the amount. Your ultimate goal should be at least 30 minutes of moderate physical activity, at least five days a week.    Guidelines for a Low Cholesterol, Low Saturated Fat Diet   Fats - Limit total intake of fats and oils. - Avoid butter, stick margarine, shortening, lard, palm and coconut oils. - Limit mayonnaise, salad dressings, gravies and sauces, unless they are homemade with low-fat ingredients. - Limit chocolate. - Choose low-fat and nonfat products, such as low-fat mayonnaise, low-fat or non-hydrogenated peanut butter, low-fat or fat-free salad dressings and nonfat gravy. - Use vegetable oil, such as canola or olive oil. - Look for margarine that does not contain trans fatty acids. - Use nuts in moderate amounts. - Read ingredient labels carefully to determine both amount and type of fat present in foods. Limit saturated and trans fats! - Avoid high-fat  processed and convenience foods.  Meats and Meat Alternatives -  Choose fish, chicken, [place] and lean meats. - Use dried beans, peas, lentils and tofu. - Limit egg yolks to three to four per week. - If you eat red meat, limit to no more than three servings per week and choose loin or round cuts. - Avoid fatty meats, such as bacon, sausage, franks, luncheon meats and ribs. - Avoid all organ meats, including liver.  Dairy - Choose nonfat or low-fat milk, yogurt and cottage cheese. - Most cheeses are high in fat. Choose cheeses made from non-fat milk, such as mozzarella and ricotta cheese. - Choose light or fat-free cream cheese and sour cream. - Avoid cream and sauces made with cream.  Fruits and Vegetables - Eat a wide variety of fruits and vegetables. - Use lemon juice, vinegar or "mist" olive oil on vegetables. - Avoid adding sauces, fat or oil to vegetables.  Breads, Cereals and Grains - Choose whole-grain breads, cereals, pastas and rice. - Avoid high-fat snack foods, such as granola, cookies, pies, pastries, doughnuts and croissants.  Cooking Tips - Avoid deep fried foods. - Trim visible fat off meats and remove skin from poultry before cooking. - Bake, broil, boil, poach or roast poultry, fish and lean meats. - Drain and discard fat that drains out of meat as you cook it. - Add little or no fat to foods. - Use vegetable oil sprays to grease pans for cooking or baking. - Steam vegetables. - Use herbs or no-oil marinades to flavor foods.

## 2016-04-18 NOTE — Addendum Note (Signed)
Addended by: Fonnie Mu on: 04/18/2016 10:21 AM   Modules accepted: Orders
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
## 2016-04-19 ENCOUNTER — Other Ambulatory Visit: Payer: Self-pay | Admitting: Family Medicine

## 2016-05-16 ENCOUNTER — Ambulatory Visit (AMBULATORY_SURGERY_CENTER): Payer: Self-pay | Admitting: *Deleted

## 2016-05-16 VITALS — Ht 61.25 in | Wt 142.0 lb

## 2016-05-16 DIAGNOSIS — Z86010 Personal history of colonic polyps: Secondary | ICD-10-CM

## 2016-05-16 MED ORDER — NA SULFATE-K SULFATE-MG SULF 17.5-3.13-1.6 GM/177ML PO SOLN: ORAL | 0 refills | Status: DC

## 2016-05-16 NOTE — Progress Notes (Signed)
Patient denies any allergies to eggs or soy. Patient denies any problems with anesthesia/sedation. Patient denies any oxygen use at home and does not take any diet/weight loss medications.  

## 2016-05-17 ENCOUNTER — Encounter: Payer: Self-pay | Admitting: Gastroenterology

## 2016-05-18 ENCOUNTER — Ambulatory Visit: Payer: Managed Care, Other (non HMO) | Admitting: Family Medicine

## 2016-05-19 ENCOUNTER — Ambulatory Visit (INDEPENDENT_AMBULATORY_CARE_PROVIDER_SITE_OTHER): Payer: Managed Care, Other (non HMO) | Admitting: Family Medicine

## 2016-05-19 ENCOUNTER — Encounter: Payer: Self-pay | Admitting: Family Medicine

## 2016-05-19 VITALS — BP 117/73 | HR 92 | Ht 61.25 in | Wt 142.9 lb

## 2016-05-19 DIAGNOSIS — Z91199 Patient's noncompliance with other medical treatment and regimen due to unspecified reason: Secondary | ICD-10-CM

## 2016-05-19 DIAGNOSIS — F17200 Nicotine dependence, unspecified, uncomplicated: Secondary | ICD-10-CM

## 2016-05-19 DIAGNOSIS — E7800 Pure hypercholesterolemia, unspecified: Secondary | ICD-10-CM

## 2016-05-19 DIAGNOSIS — E0800 Diabetes mellitus due to underlying condition with hyperosmolarity without nonketotic hyperglycemic-hyperosmolar coma (NKHHC): Secondary | ICD-10-CM | POA: Diagnosis not present

## 2016-05-19 DIAGNOSIS — E782 Mixed hyperlipidemia: Secondary | ICD-10-CM

## 2016-05-19 DIAGNOSIS — F1510 Other stimulant abuse, uncomplicated: Secondary | ICD-10-CM

## 2016-05-19 DIAGNOSIS — Z9119 Patient's noncompliance with other medical treatment and regimen: Secondary | ICD-10-CM

## 2016-05-19 DIAGNOSIS — E663 Overweight: Secondary | ICD-10-CM

## 2016-05-19 DIAGNOSIS — B0229 Other postherpetic nervous system involvement: Secondary | ICD-10-CM

## 2016-05-19 DIAGNOSIS — Z716 Tobacco abuse counseling: Secondary | ICD-10-CM

## 2016-05-19 DIAGNOSIS — Z723 Lack of physical exercise: Secondary | ICD-10-CM

## 2016-05-19 MED ORDER — BUPROPION HCL ER (XL) 150 MG PO TB24: 150.0000 mg | ORAL_TABLET | ORAL | 1 refills | Status: DC

## 2016-05-19 NOTE — Patient Instructions (Addendum)
   For helping you quitting smoking we will give you Symbicort temporarily and you also use Mucinex DM over-the-counter and can double it.  Please let me know if your symptoms are bad with these medicines because we can always use prednisone as a last resort   Please look at the handouts I gave you and consider purchasing a mini vaporizer and use nicotine free liquid to help you quit.   - If you have any problems with the Wellbutrin that I'm giving you today, please follow up sooner than planned.  Please think seriously about quitting smoking!  This is very important for your health and well being.    There are many things we can do to help you quit.  Let us know if you are interested.  You can also call 1-800-QUIT-NOW (386)643-0043) for free smoking cessation counseling and support. And please go online to www.heart.org to the American Heart Association website and search "quit smoking ".   There is a lot of great information on this website for you to look over.
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
## 2016-05-20 LAB — HEPATIC FUNCTION PANEL
ALK PHOS: 57 U/L (ref 33–130)
ALT: 10 U/L (ref 6–29)
AST: 13 U/L (ref 10–35)
Albumin: 4.4 g/dL (ref 3.6–5.1)
BILIRUBIN DIRECT: 0.1 mg/dL (ref <=0.2)
BILIRUBIN INDIRECT: 0.2 mg/dL (ref 0.2–1.2)
BILIRUBIN TOTAL: 0.3 mg/dL (ref 0.2–1.2)
Total Protein: 7.2 g/dL (ref 6.1–8.1)

## 2016-05-22 DIAGNOSIS — Z716 Tobacco abuse counseling: Secondary | ICD-10-CM | POA: Insufficient documentation

## 2016-05-22 NOTE — Assessment & Plan Note (Signed)
Cont higher dose Neurontin.   Discussed with patient that I can add Lidoderm patch in future if needed.

## 2016-05-22 NOTE — Assessment & Plan Note (Signed)
Told pt to think seriously about quitting smoking!  Told pt it is very important for his/her health and well being.    Smoking cessation instruction/counseling given: WILL START WELLBUTRIN;  counseled patient on the dangers of tobacco use, advised patient to stop smoking, and reviewed strategies to maximize success.  Handouts given on vaping- start and don't use any nicotine in it--> or taper down to none  Discussed with patient that there are multiple treatments to aid in quitting smoking, however I explained none will work unless pt really want to quit  Told to call 1-800-QUIT-NOW 303-789-2461) for free smoking cessation counseling and support, or pt can go online to www.heart.org - the American Heart Association website and search "quit smoking ".   >er 38min counseling

## 2016-05-22 NOTE — Assessment & Plan Note (Signed)
Has had DM for 11-12 yrs now.   FBS---> 140, on ave 105-130.   Cont Metfromin XL 1000QD  - Counseled patient on pathophysiology of disease and discussed various treatment options, which often includes dietary and lifestyle modifications as first line.  Importance of low carb/ketogenic diet discussed with patient in addition to regular exercise.   - Check FBS and 2 hours after the biggest meal of your day.  Keep log and bring in next OV for my review.   Also, if you ever feel poorly, please check your blood pressure and blood sugar, as one or the other could be the cause of your symptoms.  - Being a diabetic, you need yearly eye and foot exams. Make appt.for diabetic eye exam  - used to be on actos and [place] also- by Dr. Loanne Drilling, made her gain too much weight.  So she stopped the medicine and stopped going to the doctor

## 2016-05-22 NOTE — Assessment & Plan Note (Signed)
>>  ASSESSMENT AND PLAN FOR CURRENT EVERY WEEK SMOKER- 30 PK YR HX- QUIT OCT 2018; 3-4 CIGARETTES/WEEK OR LESS WRITTEN ON 05/22/2016  9:10 AM BY [name], Bettie, DO  Told pt to think seriously about quitting smoking!  Told pt it is very important for his/her health and well being.    Smoking cessation instruction/counseling given: WILL START WELLBUTRIN;  counseled patient on the dangers of tobacco use, advised patient to stop smoking, and reviewed strategies to maximize success.  Handouts given on vaping- start and don't use any nicotine in it--> or taper down to none  Discussed with patient that there are multiple treatments to aid in quitting smoking, however I explained none will work unless pt really want to quit  Told to call 1-800-QUIT-NOW (701) 246-6115) for free smoking cessation counseling and support, or pt can go online to www.heart.org - the American Heart Association website and search "quit smoking ".   >er counseling

## 2016-05-22 NOTE — Assessment & Plan Note (Signed)
Exercise daily at least 5-70min QD

## 2016-05-22 NOTE — Assessment & Plan Note (Signed)
Risks associated with noncompliant behavior with my treatment regimen or treatment plan reviewed with patient.   Pt denies depression/ mood disorder, denies significant barriers of education, money etc.  Explained many significant risks associated with these disease processes that if they remain poorly controlled, can even lead to death.  

## 2016-05-22 NOTE — Assessment & Plan Note (Signed)
Counseling done 

## 2016-05-22 NOTE — Assessment & Plan Note (Signed)
Encouraged to cut back 

## 2016-05-30 ENCOUNTER — Ambulatory Visit (AMBULATORY_SURGERY_CENTER): Payer: Managed Care, Other (non HMO) | Admitting: Gastroenterology

## 2016-05-30 ENCOUNTER — Encounter: Payer: Self-pay | Admitting: Gastroenterology

## 2016-05-30 VITALS — BP 97/51 | HR 74 | Temp 97.8°F | Resp 14 | Ht 61.0 in | Wt 142.0 lb

## 2016-05-30 DIAGNOSIS — D122 Benign neoplasm of ascending colon: Secondary | ICD-10-CM | POA: Diagnosis not present

## 2016-05-30 DIAGNOSIS — Z1211 Encounter for screening for malignant neoplasm of colon: Secondary | ICD-10-CM

## 2016-05-30 DIAGNOSIS — D123 Benign neoplasm of transverse colon: Secondary | ICD-10-CM

## 2016-05-30 DIAGNOSIS — Z86010 Personal history of colonic polyps: Secondary | ICD-10-CM

## 2016-05-30 DIAGNOSIS — D125 Benign neoplasm of sigmoid colon: Secondary | ICD-10-CM

## 2016-05-30 LAB — GLUCOSE, CAPILLARY
GLUCOSE-CAPILLARY: 189 mg/dL — AB (ref 65–99)
Glucose-Capillary: 128 mg/dL — ABNORMAL HIGH (ref 65–99)

## 2016-05-30 MED ORDER — SODIUM CHLORIDE 0.9 % IV SOLN: 500.0000 mL | INTRAVENOUS | Status: DC

## 2016-05-30 NOTE — Patient Instructions (Signed)
YOU HAD AN ENDOSCOPIC PROCEDURE TODAY AT THE [facility]:   Refer to the procedure report that was given to you for any specific questions about what was found during the examination.  If the procedure report does not answer your questions, please call your gastroenterologist to clarify.  If you requested that your care partner not be given the details of your procedure findings, then the procedure report has been included in a sealed envelope for you to review at your convenience later.  YOU SHOULD EXPECT: Some feelings of bloating in the abdomen. Passage of more gas than usual.  Walking can help get rid of the air that was put into your GI tract during the procedure and reduce the bloating. If you had a lower endoscopy (such as a colonoscopy or flexible sigmoidoscopy) you may notice spotting of blood in your stool or on the toilet paper. If you underwent a bowel prep for your procedure, you may not have a normal bowel movement for a few days.  Please Note:  You might notice some irritation and congestion in your nose or some drainage.  This is from the oxygen used during your procedure.  There is no need for concern and it should clear up in a day or so.  SYMPTOMS TO REPORT IMMEDIATELY:   Following lower endoscopy (colonoscopy or flexible sigmoidoscopy):  Excessive amounts of blood in the stool  Significant tenderness or worsening of abdominal pains  Swelling of the abdomen that is new, acute  Fever of 100F or higher    For urgent or emergent issues, a gastroenterologist can be reached at any hour by calling [phone].   DIET:  We do recommend a small meal at first, but then you may proceed to your regular diet.  Drink plenty of fluids but you should avoid alcoholic beverages for 24 hours.  ACTIVITY:  You should plan to take it easy for the rest of today and you should NOT DRIVE or use heavy machinery until tomorrow (because of the sedation medicines used during the test).     FOLLOW UP: Our staff will call the number listed on your records the next business day following your procedure to check on you and address any questions or concerns that you may have regarding the information given to you following your procedure. If we do not reach you, we will leave a message.  However, if you are feeling well and you are not experiencing any problems, there is no need to return our call.  We will assume that you have returned to your regular daily activities without incident.  If any biopsies were taken you will be contacted by phone or by letter within the next 1-3 weeks.  Please call us at [phone] if you have not heard about the biopsies in 3 weeks.    SIGNATURES/CONFIDENTIALITY: You and/or your care partner have signed paperwork which will be entered into your electronic medical record.  These signatures attest to the fact that that the information above on your After Visit Summary has been reviewed and is understood.  Full responsibility of the confidentiality of this discharge information lies with you and/or your care-partner.   Resume medications. Information given on polyps and hemorrhoids. 

## 2016-05-30 NOTE — Op Note (Signed)
[facility] Patient Name: Autumn Walker Procedure Date: 05/30/2016 8:53 AM MRN: [id number] Endoscopist: Napoleon Form , MD Age: 65 Referring MD:  Date of Birth: 31-Aug-1950 Gender: Female Account #: [id number] Procedure:                Colonoscopy Indications:              High risk colon cancer surveillance: Personal                            history of colonic polyps, Last colonoscopy: 2005 Medicines:                Monitored Anesthesia Care Procedure:                Pre-Anesthesia Assessment:                           - Prior to the procedure, a History and Physical                            was performed, and patient medications and                            allergies were reviewed. The patient's tolerance of                            previous anesthesia was also reviewed. The risks                            and benefits of the procedure and the sedation                            options and risks were discussed with the patient.                            All questions were answered, and informed consent                            was obtained. Prior Anticoagulants: The patient has                            taken no previous anticoagulant or antiplatelet                            agents. ASA Grade Assessment: II - A patient with                            mild systemic disease. After reviewing the risks                            and benefits, the patient was deemed in                            satisfactory condition to undergo the procedure.  After obtaining informed consent, the colonoscope                            was passed under direct vision. Throughout the                            procedure, the patient's blood pressure, pulse, and                            oxygen saturations were monitored continuously. The                            Model PCF-H190L [id number]) scope was introduced   through the anus and advanced to the the terminal                            ileum, with identification of the appendiceal                            orifice and IC valve. The quality of the bowel                            preparation was adequate. The terminal ileum,                            ileocecal valve, appendiceal orifice, and rectum                            were photographed. Scope In: 9:00:34 AM Scope Out: 9:42:33 AM Scope Withdrawal Time: 0 hours 35 minutes 0 seconds  Total Procedure Duration: 0 hours 41 minutes 59 seconds  Findings:                 The perianal and digital rectal examinations were                            normal.                           A 25 mm polyp was found in the ascending colon. The                            polyp was granular lateral spreading. The polyp was                            removed with a hot snare. The polyp was removed                            with a piecemeal technique using a hot snare.                            Resection and retrieval were complete.                           A 15 mm polyp  was found in the transverse colon.                            The polyp was flat. The polyp was removed with a                            hot snare. Resection and retrieval were complete.                           A 7 mm polyp was found in the sigmoid colon. The                            polyp was flat. The polyp was removed with a cold                            snare. Resection and retrieval were complete.                           Non-bleeding internal hemorrhoids were found during                            retroflexion. The hemorrhoids were small. Complications:            No immediate complications. Estimated Blood Loss:     Estimated blood loss was minimal. Impression:               - One 25 mm polyp in the ascending colon, removed                            with a hot snare and removed piecemeal using a hot                             snare. Resected and retrieved.                           - One 15 mm polyp in the transverse colon, removed                            with a hot snare. Resected and retrieved.                           - One 7 mm polyp in the sigmoid colon, removed with                            a cold snare. Resected and retrieved.                           - Non-bleeding internal hemorrhoids. Recommendation:           - Patient has a contact number available for                            emergencies. The signs and symptoms of potential  delayed complications were discussed with the                            patient. Return to normal activities tomorrow.                            Written discharge instructions were provided to the                            patient.                           - Resume previous diet.                           - Continue present medications.                           - Await pathology results.                           - Repeat colonoscopy in 1 year for surveillance                            after piecemeal polypectomy.                           - Return to GI clinic PRN. Napoleon Form, MD 05/30/2016 9:53:24 AM This report has been signed electronically.

## 2016-05-30 NOTE — Progress Notes (Signed)
A/ox3, pleased with MAC, report to RN 

## 2016-05-31 ENCOUNTER — Telehealth: Payer: Self-pay | Admitting: *Deleted

## 2016-05-31 NOTE — Telephone Encounter (Signed)
  Follow up Call-  Call back number 05/30/2016  Post procedure Call Back phone  # [phone]  Permission to leave phone message Yes  Some recent data might be hidden     Patient questions:  Do you have a fever, pain , or abdominal swelling? No. Pain Score  0 *  Have you tolerated food without any problems? Yes.    Have you been able to return to your normal activities? Yes.    Do you have any questions about your discharge instructions: Diet   No. Medications  No. Follow up visit  No.  Do you have questions or concerns about your Care? No.  Actions: * If pain score is 4 or above: No action needed, pain <4.

## 2016-06-09 ENCOUNTER — Telehealth: Payer: Self-pay | Admitting: Gastroenterology

## 2016-06-10 NOTE — Telephone Encounter (Signed)
Pt is calling back for path results best contact number is [phone].

## 2016-06-13 ENCOUNTER — Encounter: Payer: Self-pay | Admitting: Gastroenterology

## 2016-06-13 NOTE — Telephone Encounter (Signed)
Tubular adenoma What is her recall? Thanks

## 2016-06-13 NOTE — Telephone Encounter (Signed)
Recall colonoscopy in 1 yr. Thanks

## 2016-06-13 NOTE — Telephone Encounter (Signed)
Advised 

## 2016-07-19 ENCOUNTER — Other Ambulatory Visit: Payer: Self-pay | Admitting: Family Medicine

## 2016-07-19 DIAGNOSIS — B0229 Other postherpetic nervous system involvement: Secondary | ICD-10-CM

## 2016-08-16 ENCOUNTER — Ambulatory Visit: Payer: Managed Care, Other (non HMO) | Admitting: Family Medicine

## 2016-08-25 ENCOUNTER — Other Ambulatory Visit: Payer: Self-pay | Admitting: Family Medicine

## 2016-09-23 ENCOUNTER — Ambulatory Visit: Payer: Managed Care, Other (non HMO) | Admitting: Family Medicine

## 2016-11-08 ENCOUNTER — Ambulatory Visit (INDEPENDENT_AMBULATORY_CARE_PROVIDER_SITE_OTHER): Payer: 59 | Admitting: Family Medicine

## 2016-11-08 ENCOUNTER — Encounter: Payer: Self-pay | Admitting: Family Medicine

## 2016-11-08 VITALS — BP 116/69 | HR 98 | Ht 61.25 in | Wt 146.1 lb

## 2016-11-08 DIAGNOSIS — E119 Type 2 diabetes mellitus without complications: Secondary | ICD-10-CM | POA: Diagnosis not present

## 2016-11-08 DIAGNOSIS — E782 Mixed hyperlipidemia: Secondary | ICD-10-CM | POA: Diagnosis not present

## 2016-11-08 DIAGNOSIS — E0800 Diabetes mellitus due to underlying condition with hyperosmolarity without nonketotic hyperglycemic-hyperosmolar coma (NKHHC): Secondary | ICD-10-CM

## 2016-11-08 DIAGNOSIS — F17200 Nicotine dependence, unspecified, uncomplicated: Secondary | ICD-10-CM

## 2016-11-08 DIAGNOSIS — E663 Overweight: Secondary | ICD-10-CM

## 2016-11-08 DIAGNOSIS — Z716 Tobacco abuse counseling: Secondary | ICD-10-CM

## 2016-11-08 DIAGNOSIS — J0141 Acute recurrent pansinusitis: Secondary | ICD-10-CM

## 2016-11-08 LAB — POCT GLYCOSYLATED HEMOGLOBIN (HGB A1C): HEMOGLOBIN A1C: 7.1

## 2016-11-08 MED ORDER — AMOXICILLIN 875 MG PO TABS: 875.0000 mg | ORAL_TABLET | Freq: Two times a day (BID) | ORAL | 0 refills | Status: DC

## 2016-11-08 MED ORDER — EZETIMIBE 10 MG PO TABS: 10.0000 mg | ORAL_TABLET | Freq: Every day | ORAL | 3 refills | Status: DC

## 2016-11-08 MED ORDER — ROSUVASTATIN CALCIUM 10 MG PO TABS: 10.0000 mg | ORAL_TABLET | Freq: Every day | ORAL | 3 refills | Status: DC
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
## 2016-11-08 NOTE — Patient Instructions (Addendum)
Take 1/2 tab of the crestor/ rosuvastatin for 1-2 wks and then go to one tab  Needs to have LFT ( ALT done in 4-6wks)     Cholesterol Cholesterol is a white, waxy, fat-like substance that is needed by the human body in small amounts. The liver makes all the cholesterol we need. Cholesterol is carried from the liver by the blood through the blood vessels. Deposits of cholesterol (plaques) may build up on blood vessel (artery) walls. Plaques make the arteries narrower and stiffer. Cholesterol plaques increase the risk for heart attack and stroke. You cannot feel your cholesterol level even if it is very high. The only way to know that it is high is to have a blood test. Once you know your cholesterol levels, you should keep a record of the test results. Work with your health care provider to keep your levels in the desired range. What do the results mean?  Total cholesterol is a rough measure of all the cholesterol in your blood.  LDL (low-density lipoprotein) is the "bad" cholesterol. This is the type that causes plaque to build up on the artery walls. You want this level to be low.  HDL (high-density lipoprotein) is the "good" cholesterol because it cleans the arteries and carries the LDL away. You want this level to be high.  Triglycerides are fat that the body can either burn for energy or store. High levels are closely linked to heart disease. What are the desired levels of cholesterol?  Total cholesterol below 200.  LDL below 100 for people who are at risk, below 70 for people at very high risk.  HDL above 40 is good. A level of 60 or higher is considered to be protective against heart disease.  Triglycerides below 150. How can I lower my cholesterol? Diet  Follow your diet program as told by your health care provider.  Choose fish or white meat chicken and [place], roasted or baked. Limit fatty cuts of red meat, fried foods, and processed meats, such as sausage and lunch  meats.  Eat lots of fresh fruits and vegetables.  Choose whole grains, beans, pasta, potatoes, and cereals.  Choose olive oil, corn oil, or canola oil, and use only small amounts.  Avoid butter, mayonnaise, shortening, or palm kernel oils.  Avoid foods with trans fats.  Drink skim or nonfat milk and eat low-fat or nonfat yogurt and cheeses. Avoid whole milk, cream, ice cream, egg yolks, and full-fat cheeses.  Healthier desserts include angel food cake, ginger snaps, animal crackers, hard candy, popsicles, and low-fat or nonfat frozen yogurt. Avoid pastries, cakes, pies, and cookies. Exercise  Follow your exercise program as told by your health care provider. A regular program:  Helps to decrease LDL and raise HDL.  Helps with weight control.  Do things that increase your activity level, such as gardening, walking, and taking the stairs.  Ask your health care provider about ways that you can be more active in your daily life. Medicine  Take over-the-counter and prescription medicines only as told by your health care provider.  Medicine may be prescribed by your health care provider to help lower cholesterol and decrease the risk for heart disease. This is usually done if diet and exercise have failed to bring down cholesterol levels.  If you have several risk factors, you may need medicine even if your levels are normal. This information is not intended to replace advice given to you by your health care provider. Make sure you discuss  any questions you have with your health care provider. Document Released: 03/22/2001 Document Revised: 01/23/2016 Document Reviewed: 12/26/2015 Elsevier Interactive Patient Education  2017 [place] [employer]--> lower triglyceride levels more through...  1) - Beware of bad fats: Cutting back on saturated fat (in red meat and full-fat dairy foods) and trans fats (in restaurant fried foods and commercially prepared baked goods)  can lower triglycerides.  2) - Go for good carbs: Easily digested carbohydrates (such as white bread, white rice, cornflakes, and sugary sodas) give triglycerides a definite boost.   3) - Eating whole grains and cutting back on soda can help control triglycerides.  4) - Check your alcohol use. In some people, alcohol dramatically boosts triglycerides. The only way to know if this is true for you is to avoid alcohol for a few weeks and have your triglycerides tested again.  5) - Go fish. Omega-3 fats in salmon, tuna, sardines, and other fatty fish can lower triglycerides. Having fish twice a week is fine.  6) - Aim for a healthy weight. If you are overweight, losing just 5% to 10% of your weight can help drive down triglycerides.  7) - Get moving. Exercise lowers triglycerides and boosts heart-healthy HDL cholesterol.  8) - quit smoking if you do    For more detailed info----->   For those diagnosed with high triglycerides, it's important to take action to lower your levels and improve your heart health.  Triglyceride is just a fancy word for fat - the fat in our bodies is stored in the form of triglycerides. Triglycerides are found in foods and manufactured in our bodies.  Normal triglyceride levels are defined as less than 150 mg/dL; 150 to 199 is considered borderline high; 200 to 499 is high; and 500 or higher is officially called very high. To me, anything over 150 is a red flag indicating my patient needs to take immediate steps to get the situation under control.   What is the significance of high triglycerides? High triglyceride levels make blood thicker and stickier, which means that it is more likely to form clots. Studies have shown that triglyceride levels are associated with increased risks of cardiovascular disease and stroke - in both men and women - alone or in combination with other risk factors (high triglycerides combined with high LDL cholesterol can be a particularly  deadly combination). For example, in one ground-breaking study, high triglycerides alone increased the risk of cardiovascular disease by 14 percent in men, and by 58 percent in women. But when the test subjects also had low HDL cholesterol (that's the good cholesterol) and other risk factors, high triglycerides increased the risk of disease by 32 percent in men and 76 percent in women.   Fortunately, triglycerides can sometimes be controlled with several diet and lifestyle changes.    What Factors Can Increase Triglycerides? As with cholesterol, eating too much of the wrong kinds of fats will raise your blood triglycerides.  Therefore, it's important to restrict the amounts of saturated fats and trans fats you allow into your diet.  Triglyceride levels can also shoot up after eating foods that are high in carbohydrates or after drinking alcohol.  That's why triglyceride blood tests require an overnight fast.  If you have elevated triglycerides, it's especially important to avoid sugary and refined carbohydrates, including sugar, honey, and other sweeteners, soda and other sugary drinks, candy, baked goods, and anything made with white (refined or enriched) flour, including  white bread, rolls, cereals, buns, pastries, regular pasta, and white rice.  You'll also want to limit dried fruit and fruit juice since they're dense in simple sugar.  All of these low-quality carbs cause a sudden rise in insulin, which may lead to a spike in triglycerides.  Triglycerides can also become elevated as a reaction to having diabetes, hypothyroidism, or kidney disease. As with most other heart-related factors, being overweight and inactive also contribute to abnormal triglycerides. And unfortunately, some people have a genetic predisposition that causes them to manufacture way too much triglycerides on their own, no matter how carefully they eat.   How Can You Lower Your Triglyceride Levels? If you are diagnosed with high  triglycerides, it's important to take action. There are several things you can do to help lower your triglyceride levels and improve your heart health:  Lose weight if you are overweight.  There is a clear correlation between obesity and high triglycerides - the heavier people are, the higher their triglyceride levels are likely to be. The good news is that losing weight can significantly lower triglycerides. In a large study of individuals with type 2 diabetes, those assigned to the "lifestyle intervention group" - which involved counseling, a low-calorie meal plan, and customized exercise program - lost 8.6% of their body weight and lowered their triglyceride levels by more than 16%. If you're overweight, find a weight loss plan that works for you and commit to shedding the pounds and getting healthier.  Reduce the amount of saturated fat and trans fat in your diet.  Start by avoiding or dramatically limiting butter, cream cheese, lard, sour cream, doughnuts, cakes, cookies, candy bars, regular ice cream, fried foods, pizza, cheese sauce, cream-based sauces and salad dressings, high-fat meats (including fatty hamburgers, bologna, pepperoni, sausage, bacon, salami, pastrami, spareribs, and hot dogs), high-fat cuts of beef and pork, and whole-milk dairy products.   Other ways to cut back: Choose lean meats only (including skinless chicken and [place], lean beef, lean pork), fish, and reduced-fat or fat-free dairy products.   Experiment with adding whole soy foods to your diet. Although soy itself may not reduce risk of heart disease, it replaces hazardous animal fats with healthier proteins. Choose high-quality soy foods, such as tofu, tempeh, soy milk, and edamame (whole soybeans).  Always remove skin from poultry.  Prepare foods by baking, roasting, broiling, boiling, poaching, steaming, grilling, or stir-frying in vegetable oil.  Most stick margarines contain trans fats, and trans fats are also  found in some packaged baked goods, potato chips, snack foods, fried foods, and fast food that use or create hydrogenated oils.    (All food labels must now list the amount of trans fats, right after the amount of saturated fats - good news for consumers. As a result, many food companies have now reformulated their products to be trans fat free.many, but not all! So it's still just as important to read labels and make sure the packaged foods you buy don't contain trans fats.)     If you use margarine, purchase soft-tub margarine spreads that contain 0 grams trans fats and don't list any partially hydrogenated oils in the ingredients list. By substituting olive oil or vegetable oil for trans fats in just 2 percent of your daily calories, you can reduce your risk of heart disease by 53 percent.   There is no safe amount of trans fats, so try to keep them as far from your plate as possible.  Avoid foods that are  concentrated in sugar (even dried fruit and fruit juice). Sugary foods can elevate triglyceride levels in the blood, so keep them to a bare minimum.  Swap out refined carbohydrates for whole grains.  Refined carbohydrates - like white rice, regular pasta, and anything made with white or "enriched" flour (including white bread, rolls, cereals, buns, and crackers) - raise blood sugar and insulin levels more than fiber-rich whole grains. Higher insulin levels, in turn, can lead to a higher rise in triglycerides after a meal. So, make the switch to whole wheat bread, whole grain pasta, brown or wild rice, and whole grain versions of cereals, crackers, and other bread products. However, it's important to know that individuals with high triglycerides should moderate even their intake of high-quality starches (since all starches raise blood sugar) - I recommend 1 to 2 servings per meal.  Cut way back on alcohol.  If you have high triglycerides, alcohol should be considered a rare treat - if you indulge at  all, since even small amounts of alcohol can dramatically increase triglyceride levels.  Incorporate omega-3 fats.  Heart-healthy fish oils are especially rich in omega-3 fatty acids. In multiple studies over the past two decades, people who ate diets high in omega-3s had 30 to 40 percent reductions in heart disease. Although we don't yet know why fish oil works so well, there are several possibilities. Omega-3s seem to reduce inflammation, reduce high blood pressure, decrease triglycerides, raise HDL cholesterol, and make blood thinner and less sticky so it is less likely to clot. It's as close to a food prescription for heart health as it gets. If you have high triglycerides, I recommend eating at least three servings of one of the omega-3-rich fish every week (fatty fish is the most concentrated food form of omega three fats). If you cannot manage to eat that much fish, speak with your physician about taking fish oil capsules, which offer similar benefits.The best foods for omega-3 fatty acids include wild salmon (fresh, canned), herring, mackerel (not king), sardines, anchovies, rainbow trout, and Pacific oysters. Non-fish sources of omega-3 fats include omega-3-fortified eggs, ground flaxseed, chia seeds, walnuts, butternuts (white walnuts), seaweed, walnut oil, canola oil, and soybeans.  Quit smoking.  Smoking causes inflammation, not just in your lungs, but throughout your body. Inflammation can contribute to atherosclerosis, blood clots, and risk of heart attack. Smoking makes all heart health indicators worse. If you have high cholesterol, high triglycerides, or high blood pressure, smoking magnifies the danger.  Become more physically active.  Even moderate exercise can help improve cholesterol, triglycerides, and blood pressure. Aerobic exercise seems to be able to stop the sharp rise of triglycerides after eating, perhaps because of a decrease in the amount of triglyceride released by the liver,  or because active muscle clears triglycerides out of the blood stream more quickly than inactive muscle. If you haven't exercised regularly (or at all) for years, I recommend starting slowly, by walking at an easy pace for 15 minutes a day. Then, as you feel more comfortable, increase the amount. Your ultimate goal should be at least 30 minutes of moderate physical activity, at least five days a week.

## 2016-11-13 ENCOUNTER — Other Ambulatory Visit: Payer: Self-pay | Admitting: Family Medicine

## 2016-11-13 DIAGNOSIS — J438 Other emphysema: Secondary | ICD-10-CM

## 2016-11-13 DIAGNOSIS — F17200 Nicotine dependence, unspecified, uncomplicated: Secondary | ICD-10-CM

## 2016-11-25 ENCOUNTER — Other Ambulatory Visit: Payer: Self-pay | Admitting: Family Medicine

## 2016-11-25 DIAGNOSIS — J0141 Acute recurrent pansinusitis: Secondary | ICD-10-CM

## 2016-12-14 ENCOUNTER — Other Ambulatory Visit: Payer: Self-pay | Admitting: Family Medicine

## 2016-12-14 DIAGNOSIS — B0229 Other postherpetic nervous system involvement: Secondary | ICD-10-CM

## 2016-12-27 ENCOUNTER — Other Ambulatory Visit (INDEPENDENT_AMBULATORY_CARE_PROVIDER_SITE_OTHER): Payer: 59

## 2016-12-27 DIAGNOSIS — E0800 Diabetes mellitus due to underlying condition with hyperosmolarity without nonketotic hyperglycemic-hyperosmolar coma (NKHHC): Secondary | ICD-10-CM | POA: Diagnosis not present

## 2016-12-27 DIAGNOSIS — E663 Overweight: Secondary | ICD-10-CM

## 2016-12-27 DIAGNOSIS — E782 Mixed hyperlipidemia: Secondary | ICD-10-CM

## 2016-12-28 LAB — COMPREHENSIVE METABOLIC PANEL
ALBUMIN: 4.6 g/dL (ref 3.6–4.8)
ALT: 22 IU/L (ref 0–32)
AST: 26 IU/L (ref 0–40)
Albumin/Globulin Ratio: 1.6 (ref 1.2–2.2)
Alkaline Phosphatase: 65 IU/L (ref 39–117)
BUN / CREAT RATIO: 16 (ref 12–28)
BUN: 13 mg/dL (ref 8–27)
CHLORIDE: 99 mmol/L (ref 96–106)
CO2: 22 mmol/L (ref 20–29)
Calcium: 9.7 mg/dL (ref 8.7–10.3)
Creatinine, Ser: 0.81 mg/dL (ref 0.57–1.00)
GFR calc non Af Amer: 77 mL/min/{1.73_m2} (ref >59)
GFR, EST AFRICAN AMERICAN: 89 mL/min/{1.73_m2} (ref >59)
GLUCOSE: 180 mg/dL — AB (ref 65–99)
Globulin, Total: 2.8 g/dL (ref 1.5–4.5)
Potassium: 4.7 mmol/L (ref 3.5–5.2)
Sodium: 140 mmol/L (ref 134–144)
TOTAL PROTEIN: 7.4 g/dL (ref 6.0–8.5)

## 2016-12-28 LAB — LIPID PANEL
CHOLESTEROL TOTAL: 186 mg/dL (ref 100–199)
Chol/HDL Ratio: 5.6 ratio — ABNORMAL HIGH (ref 0.0–4.4)
HDL: 33 mg/dL — ABNORMAL LOW (ref >39)
LDL Calculated: 88 mg/dL (ref 0–99)
TRIGLYCERIDES: 327 mg/dL — AB (ref 0–149)
VLDL Cholesterol Cal: 65 mg/dL — ABNORMAL HIGH (ref 5–40)

## 2016-12-28 LAB — HEMOGLOBIN A1C
ESTIMATED AVERAGE GLUCOSE: 174 mg/dL
HEMOGLOBIN A1C: 7.7 % — AB (ref 4.8–5.6)

## 2017-02-15 ENCOUNTER — Encounter: Payer: Self-pay | Admitting: Family Medicine

## 2017-02-15 ENCOUNTER — Ambulatory Visit (INDEPENDENT_AMBULATORY_CARE_PROVIDER_SITE_OTHER): Payer: Medicare HMO | Admitting: Family Medicine

## 2017-02-15 VITALS — BP 123/74 | HR 94 | Ht 64.25 in | Wt 148.8 lb

## 2017-02-15 DIAGNOSIS — F17200 Nicotine dependence, unspecified, uncomplicated: Secondary | ICD-10-CM | POA: Diagnosis not present

## 2017-02-15 DIAGNOSIS — E1100 Type 2 diabetes mellitus with hyperosmolarity without nonketotic hyperglycemic-hyperosmolar coma (NKHHC): Secondary | ICD-10-CM

## 2017-02-15 DIAGNOSIS — Z723 Lack of physical exercise: Secondary | ICD-10-CM

## 2017-02-15 DIAGNOSIS — E781 Pure hyperglyceridemia: Secondary | ICD-10-CM

## 2017-02-15 DIAGNOSIS — B0229 Other postherpetic nervous system involvement: Secondary | ICD-10-CM | POA: Diagnosis not present

## 2017-02-15 DIAGNOSIS — J439 Emphysema, unspecified: Secondary | ICD-10-CM | POA: Diagnosis not present

## 2017-02-15 DIAGNOSIS — E782 Mixed hyperlipidemia: Secondary | ICD-10-CM

## 2017-02-15 DIAGNOSIS — R69 Illness, unspecified: Secondary | ICD-10-CM | POA: Diagnosis not present

## 2017-02-15 MED ORDER — METFORMIN HCL 500 MG PO TABS: 1000.0000 mg | ORAL_TABLET | Freq: Two times a day (BID) | ORAL | 3 refills | Status: DC

## 2017-02-15 MED ORDER — OMEGA-3-ACID ETHYL ESTERS 1 G PO CAPS: 2.0000 g | ORAL_CAPSULE | Freq: Two times a day (BID) | ORAL | 3 refills | Status: DC

## 2017-02-15 MED ORDER — NICOTINE 7 MG/24HR TD PT24: MEDICATED_PATCH | TRANSDERMAL | 0 refills | Status: DC

## 2017-02-15 MED ORDER — GABAPENTIN 300 MG PO CAPS: ORAL_CAPSULE | ORAL | 1 refills | Status: DC

## 2017-02-15 NOTE — Patient Instructions (Addendum)
THINK ABOUT A QUIT DATE.   -->     Next office visit we will see how you're doing on the 12 hour metformin versus the 24-hour that we change today, also see how you're doing on the new Lovaza tablets we started.     Keep a blood sugar log and bring in next office visit.  -->   When you go for a diabetic eye exam, please have them send the information to our office  ------------------------------------------------------------------------------------------ Please think seriously about quitting smoking!  This is very important for your health and well being.   Smoking cessation instruction/counseling given:  counseled patient on the dangers of tobacco use, advised patient to stop smoking, and reviewed strategies to maximize success  Discussed with patient that there are multiple treatments to aid in quitting smoking, however I explained none will work unless pt really wants to quit  Please let us know in the future if you are interested and ready to quit.  You can also call 1-800-QUIT-NOW 865-238-0114) for free smoking cessation counseling and support.     Also, please go online to www.heart.org (the American Heart Association website) and search "quit smoking ".    Or try the centers for disease control website at: https://www.schmidt.com/  There is a lot of great information on these websites for you to look over.      Want to Quit Smoking? FDA-Approved Products Can Help  Quitting smoking can be hard, but it is possible. In fact, every time you put out a cigarette is a new chance to try quitting again, according to the U.S. Food and Drug Administration's newest tobacco education campaign, "Every Try Counts."   If you want to quit-almost 70 percent of adult smokers say they do-you may want to use a "smoking cessation" product proven to help. Data has shown that using FDA-approved cessation medicine can double your chance of quitting  successfully.  Some products contain nicotine as an active ingredient and others do not. These products include over-the-counter (OTC) options like skin patches, lozenges, and gum, as well as prescription medicines.  Smoking cessation products are intended to help you quit smoking. They are regulated through the [facility] Drug Evaluation and [facility], which ensures that the products are safe and effective and that their benefits outweigh any known associated risks.  The Benefits of Quitting Smoking No matter how much you smoke-or for how long-quitting will benefit you.  Not only will you lower your risk of getting various cancers, including lung cancer, you'll also reduce your chances of having heart disease, a stroke, emphysema, and other serious diseases. Quitting also will lower the risk of heart disease and lung cancer in nonsmokers who otherwise would be exposed to your secondhand smoke.  Although there are benefits to quitting at any age, it is important to quit as soon as possible so your body can begin to heal from the damage caused by smoking. For instance, 12 hours after you quit smoking the carbon monoxide level in your blood drops to normal. Carbon monoxide is harmful because it displaces oxygen in the blood and deprives your heart, brain, and other vital organs of oxygen.  What To Know About Smoking Cessation Products Understanding how smoking cessation products work-and what side effects they may cause-can help you determine which product may be best for you.  If you're considering one of these products, reading labels and talking to your pharmacist and other health care providers are good first steps to take.  You also can check the FDA's website for more information on each product at Drugs@FDA , where you can search for each product by name.  And remember to weigh each product's benefits and risks, among other considerations.  About Nicotine Replacement Therapy  (NRT) Nicotine is the substance primarily responsible for causing addiction to tobacco products. Tobacco users who are addicted to nicotine are used to having nicotine in their bodies.  As you try to quit smoking, you may have symptoms of nicotine withdrawal. When you quit, this withdrawal may cause symptoms like cravings, or urges, to smoke; depression; trouble sleeping; irritability; anxiety; and increased appetite.  Nicotine withdrawal can discourage some smokers from continuing with a quit attempt. But the FDA has approved several smoking cessation products designed to help users gradually withdraw from smoking (that is, "wean" themselves from smoking) by using specific amounts of nicotine that decrease over time. This type of product is called a "nicotine replacement therapy" or NRT. It supplies nicotine in controlled amounts while sparing you from other chemicals found in tobacco products.  NRTs are available over the counter and by prescription. You should generally use them only for a short time to help you manage nicotine cravings and withdrawal. However, the FDA recognizes that some people may need to use these products longer to stay smoke-free. Talk to your health care provider to determine the best course of treatment for you.  Over-the-counter NRTs are approved for sale to people age 15 and older. They are available under various brand names and sometimes as generic products. They include:  - Skin patches (also called "transdermal nicotine patches"). These patches are placed on the skin, similar to how you would apply an adhesive bandage. - Chewing gum (also called "nicotine gum"). This gum must be chewed according to the labeled instructions to be effective. - Lozenges (also called "nicotine lozenges"). You use these products by dissolving them in your mouth. For over-the-counter products, it's important to follow the instructions on the Drug Facts Label (DFL) and to read the enclosed  User's Guide for complete directions and other important information. Ask your health care provider if you have questions.  Currently, prescription nicotine replacement therapy is available only under the brand name Nicotrol, and is available both as a nasal spray and an oral inhaler. The products are FDA-approved only for use by adults.  If you are under age 37 and want to quit smoking, talk to a health care professional about whether you should use nicotine replacement therapies.  Important Advice for People Considering Nicotine Replacement Therapy Women who are pregnant or breastfeeding should talk to their health care providers and use nicotine replacement products only if the health care providers approve.  Also talk to your health care provider before using these products if you have:  diabetes, heart disease, asthma, or stomach ulcers; had a recent heart attack; high blood pressure that is not controlled with medicine; a history of irregular heartbeat; or been prescribed medication to help you quit smoking. If you take prescription medication for depression or asthma, tell your health care provider if you are quitting smoking because he or she may need to change your prescription dose.  Stop using a nicotine replacement product and call your health care professional if you have any of the following symptoms: nausea; dizziness; weakness; vomiting; fast or irregular heartbeat; mouth problems with the lozenge or gum; or redness or swelling of the skin around the patch that does not go away.

## 2017-02-15 NOTE — Assessment & Plan Note (Signed)
Try 7mg /hr nicotine patches- cut them in half

## 2017-02-15 NOTE — Assessment & Plan Note (Signed)
Continue meds, try to increase activity to goal 30 minutes moderate intensity aerobic activity daily, try to bebetter with diet on a consistent basis of low saturated Transfats and low carb.
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
## 2017-02-15 NOTE — Assessment & Plan Note (Addendum)
>>  ASSESSMENT AND PLAN FOR TOBACCO USE DISORDER WRITTEN ON 02/15/2017  9:02 AM BY Burnie Hank, DO    We'll start nicotine patches.  -   Please see AVS for details.  -   Handouts provided on usage of nicotine patches.  -   Advised to make a quit date in stick with it.  She will think about this.  -   Breathing much improved with daily use of Symbicort.,  She will continue to use this.  >>ASSESSMENT AND PLAN FOR CURRENT EVERY WEEK SMOKER- 30 PK YR HX- QUIT OCT 2018; 3-4 CIGARETTES/WEEK OR LESS WRITTEN ON 02/15/2017  8:36 AM BY Joyell Emami, DO  Try 7mg /hr nicotine patches- cut them in half

## 2017-02-15 NOTE — Assessment & Plan Note (Signed)
>>  ASSESSMENT AND PLAN FOR HYPERLIPIDEMIA ASSOCIATED WITH TYPE 2 DIABETES MELLITUS ([place]) WRITTEN ON 02/15/2017  8:59 AM BY [name], Leisha, DO    Continue meds, try to increase activity to goal 30 minutes moderate intensity aerobic activity daily, try to bebetter with diet on a consistent basis of low saturated Transfats and low carb.

## 2017-02-15 NOTE — Assessment & Plan Note (Signed)
If her taking 2 tablets during the day and the 2 at night does not improve her symptoms.  We will take 1 in the morning 1 in the PM and then 3 at night.

## 2017-02-15 NOTE — Assessment & Plan Note (Signed)
>>  ASSESSMENT AND PLAN FOR EMPHYSEMA OF LUNG ([place]) WRITTEN ON 02/15/2017  9:03 AM BY [name], [place], DO    Continue Symbicort daily.  Has not needed albuterol at all.

## 2017-02-15 NOTE — Assessment & Plan Note (Signed)
Change from XR to 1000 mg every 12 hours.  -   Keep blood sugar log bring in next office visit -  Discussed exercise on a regular basis with patient. -   Discussed tobacco cessation completely -  Discussed eating consistently and not going long periods of time without eating.

## 2017-02-15 NOTE — Assessment & Plan Note (Signed)
Continue Symbicort daily.  Has not needed albuterol at all.

## 2017-02-21 DIAGNOSIS — H2513 Age-related nuclear cataract, bilateral: Secondary | ICD-10-CM | POA: Diagnosis not present

## 2017-02-21 DIAGNOSIS — H52223 Regular astigmatism, bilateral: Secondary | ICD-10-CM | POA: Diagnosis not present

## 2017-04-05 ENCOUNTER — Telehealth: Payer: Self-pay | Admitting: Family Medicine

## 2017-04-05 NOTE — Telephone Encounter (Signed)
Called patient notified her that she would have to come into the office for treatment.  MPulliam, CMA/RT(R)

## 2017-04-05 NOTE — Telephone Encounter (Signed)
Patient was wondering if she could get an antibiotic for a sinus infection, I stated to patient she will probably need an OV for this but she wanted me to send this message first to see if this can be bypassed. Please advise.

## 2017-04-07 DIAGNOSIS — E119 Type 2 diabetes mellitus without complications: Secondary | ICD-10-CM | POA: Diagnosis not present

## 2017-04-07 DIAGNOSIS — H35363 Drusen (degenerative) of macula, bilateral: Secondary | ICD-10-CM | POA: Diagnosis not present

## 2017-04-18 ENCOUNTER — Other Ambulatory Visit (INDEPENDENT_AMBULATORY_CARE_PROVIDER_SITE_OTHER): Payer: Medicare HMO

## 2017-04-18 DIAGNOSIS — E1100 Type 2 diabetes mellitus with hyperosmolarity without nonketotic hyperglycemic-hyperosmolar coma (NKHHC): Secondary | ICD-10-CM | POA: Diagnosis not present

## 2017-04-18 DIAGNOSIS — R5383 Other fatigue: Secondary | ICD-10-CM

## 2017-04-18 DIAGNOSIS — E782 Mixed hyperlipidemia: Secondary | ICD-10-CM

## 2017-04-18 DIAGNOSIS — E663 Overweight: Secondary | ICD-10-CM

## 2017-04-19 LAB — LIPID PANEL
CHOL/HDL RATIO: 7.4 ratio — AB (ref 0.0–4.4)
Cholesterol, Total: 228 mg/dL — ABNORMAL HIGH (ref 100–199)
HDL: 31 mg/dL — ABNORMAL LOW (ref >39)
LDL CALC: 129 mg/dL — AB (ref 0–99)
Triglycerides: 341 mg/dL — ABNORMAL HIGH (ref 0–149)
VLDL CHOLESTEROL CAL: 68 mg/dL — AB (ref 5–40)

## 2017-04-19 LAB — CBC WITH DIFFERENTIAL/PLATELET
BASOS: 1 %
Basophils Absolute: 0.1 10*3/uL (ref 0.0–0.2)
EOS (ABSOLUTE): 0.6 10*3/uL — ABNORMAL HIGH (ref 0.0–0.4)
EOS: 5 %
HEMATOCRIT: 41.9 % (ref 34.0–46.6)
HEMOGLOBIN: 14.1 g/dL (ref 11.1–15.9)
IMMATURE GRANULOCYTES: 0 %
Immature Grans (Abs): 0 10*3/uL (ref 0.0–0.1)
LYMPHS ABS: 6.4 10*3/uL — AB (ref 0.7–3.1)
Lymphs: 53 %
MCH: 30.5 pg (ref 26.6–33.0)
MCHC: 33.7 g/dL (ref 31.5–35.7)
MCV: 91 fL (ref 79–97)
MONOCYTES: 10 %
Monocytes Absolute: 1.2 10*3/uL — ABNORMAL HIGH (ref 0.1–0.9)
Neutrophils Absolute: 3.7 10*3/uL (ref 1.4–7.0)
Neutrophils: 31 %
Platelets: 431 10*3/uL — ABNORMAL HIGH (ref 150–379)
RBC: 4.63 x10E6/uL (ref 3.77–5.28)
RDW: 14.2 % (ref 12.3–15.4)
WBC: 12.1 10*3/uL — ABNORMAL HIGH (ref 3.4–10.8)

## 2017-04-19 LAB — COMPREHENSIVE METABOLIC PANEL
ALBUMIN: 4.3 g/dL (ref 3.6–4.8)
ALK PHOS: 60 IU/L (ref 39–117)
ALT: 14 IU/L (ref 0–32)
AST: 14 IU/L (ref 0–40)
Albumin/Globulin Ratio: 1.7 (ref 1.2–2.2)
BUN / CREAT RATIO: 14 (ref 12–28)
BUN: 11 mg/dL (ref 8–27)
Bilirubin Total: 0.2 mg/dL (ref 0.0–1.2)
CO2: 24 mmol/L (ref 20–29)
CREATININE: 0.8 mg/dL (ref 0.57–1.00)
Calcium: 9.3 mg/dL (ref 8.7–10.3)
Chloride: 101 mmol/L (ref 96–106)
GFR, EST AFRICAN AMERICAN: 89 mL/min/{1.73_m2} (ref >59)
GFR, EST NON AFRICAN AMERICAN: 78 mL/min/{1.73_m2} (ref >59)
GLOBULIN, TOTAL: 2.6 g/dL (ref 1.5–4.5)
Glucose: 148 mg/dL — ABNORMAL HIGH (ref 65–99)
Potassium: 4.6 mmol/L (ref 3.5–5.2)
SODIUM: 141 mmol/L (ref 134–144)
TOTAL PROTEIN: 6.9 g/dL (ref 6.0–8.5)

## 2017-04-19 LAB — TSH: TSH: 1.23 u[IU]/mL (ref 0.450–4.500)

## 2017-04-19 LAB — HEMOGLOBIN A1C
Est. average glucose Bld gHb Est-mCnc: 180 mg/dL
HEMOGLOBIN A1C: 7.9 % — AB (ref 4.8–5.6)

## 2017-04-19 LAB — VITAMIN D 25 HYDROXY (VIT D DEFICIENCY, FRACTURES): Vit D, 25-Hydroxy: 49.8 ng/mL (ref 30.0–100.0)

## 2017-04-24 ENCOUNTER — Other Ambulatory Visit: Payer: Self-pay

## 2017-04-24 DIAGNOSIS — E1100 Type 2 diabetes mellitus with hyperosmolarity without nonketotic hyperglycemic-hyperosmolar coma (NKHHC): Secondary | ICD-10-CM

## 2017-04-24 DIAGNOSIS — E782 Mixed hyperlipidemia: Secondary | ICD-10-CM

## 2017-04-24 MED ORDER — ROSUVASTATIN CALCIUM 40 MG PO TABS: 40.0000 mg | ORAL_TABLET | Freq: Every day | ORAL | 0 refills | Status: DC

## 2017-04-24 MED ORDER — LIRAGLUTIDE 18 MG/3ML ~~LOC~~ SOPN: PEN_INJECTOR | SUBCUTANEOUS | 0 refills | Status: DC

## 2017-04-24 NOTE — Telephone Encounter (Signed)
Per lab note Dr Raliegh Scarlet is increasing the patient's dose of  Crestor from 10 mg to 40 mg nightly and adding Victoza start 0.6mg  SQ for 1 week then increase to 1.2 mg.  MPulliam, CMA/RT(R)

## 2017-04-25 NOTE — Telephone Encounter (Signed)
I concur.   Perfect thank you!

## 2017-04-28 ENCOUNTER — Telehealth: Payer: Self-pay | Admitting: Family Medicine

## 2017-04-28 MED ORDER — PEN NEEDLES 31G X 6 MM MISC: 1.0000 | Freq: Every day | 1 refills | Status: DC

## 2017-04-28 NOTE — Telephone Encounter (Signed)
[facility] pharmacy rep called states they need a Rx for needles for the Victoza 18 MG/ 3ML SQ--   ----  Pleases send Rx to [place].--- Please call if any questions.  --glh

## 2017-05-17 ENCOUNTER — Encounter: Payer: Self-pay | Admitting: Family Medicine

## 2017-05-17 ENCOUNTER — Ambulatory Visit (INDEPENDENT_AMBULATORY_CARE_PROVIDER_SITE_OTHER): Payer: Medicare HMO | Admitting: Family Medicine

## 2017-05-17 VITALS — BP 105/62 | HR 90 | Ht 61.25 in | Wt 140.3 lb

## 2017-05-17 DIAGNOSIS — E663 Overweight: Secondary | ICD-10-CM | POA: Diagnosis not present

## 2017-05-17 DIAGNOSIS — Z23 Encounter for immunization: Secondary | ICD-10-CM

## 2017-05-17 DIAGNOSIS — E782 Mixed hyperlipidemia: Secondary | ICD-10-CM

## 2017-05-17 DIAGNOSIS — J439 Emphysema, unspecified: Secondary | ICD-10-CM

## 2017-05-17 DIAGNOSIS — Z723 Lack of physical exercise: Secondary | ICD-10-CM | POA: Diagnosis not present

## 2017-05-17 DIAGNOSIS — Z91199 Patient's noncompliance with other medical treatment and regimen due to unspecified reason: Secondary | ICD-10-CM

## 2017-05-17 DIAGNOSIS — E785 Hyperlipidemia, unspecified: Secondary | ICD-10-CM

## 2017-05-17 DIAGNOSIS — F17200 Nicotine dependence, unspecified, uncomplicated: Secondary | ICD-10-CM

## 2017-05-17 DIAGNOSIS — Z9119 Patient's noncompliance with other medical treatment and regimen: Secondary | ICD-10-CM

## 2017-05-17 DIAGNOSIS — R69 Illness, unspecified: Secondary | ICD-10-CM | POA: Diagnosis not present

## 2017-05-17 DIAGNOSIS — B0229 Other postherpetic nervous system involvement: Secondary | ICD-10-CM | POA: Diagnosis not present

## 2017-05-17 DIAGNOSIS — E1169 Type 2 diabetes mellitus with other specified complication: Secondary | ICD-10-CM

## 2017-05-17 DIAGNOSIS — E1100 Type 2 diabetes mellitus with hyperosmolarity without nonketotic hyperglycemic-hyperosmolar coma (NKHHC): Secondary | ICD-10-CM

## 2017-05-17 DIAGNOSIS — F410 Panic disorder [episodic paroxysmal anxiety] without agoraphobia: Secondary | ICD-10-CM | POA: Insufficient documentation

## 2017-05-17 DIAGNOSIS — Z716 Tobacco abuse counseling: Secondary | ICD-10-CM

## 2017-05-17 DIAGNOSIS — F1510 Other stimulant abuse, uncomplicated: Secondary | ICD-10-CM

## 2017-05-17 LAB — POCT UA - MICROALBUMIN
CREATININE, POC: 50 mg/dL
Microalbumin Ur, POC: 10 mg/L

## 2017-05-17 MED ORDER — ZOSTER VAC RECOMB ADJUVANTED 50 MCG/0.5ML IM SUSR
0.5000 mL | Freq: Once | INTRAMUSCULAR | 0 refills | Status: AC
Start: 2017-05-17 — End: 2017-05-17

## 2017-05-17 MED ORDER — LIRAGLUTIDE 18 MG/3ML ~~LOC~~ SOPN: PEN_INJECTOR | SUBCUTANEOUS | 3 refills | Status: DC

## 2017-05-17 NOTE — Assessment & Plan Note (Signed)
She will continue her Neurontin on current dose of her just taking 2 tablets nightly. -She will let us know if this no longer is controlling her symptoms well.

## 2017-05-17 NOTE — Assessment & Plan Note (Signed)
>>  ASSESSMENT AND PLAN FOR HYPERLIPIDEMIA ASSOCIATED WITH TYPE 2 DIABETES MELLITUS ([place]) WRITTEN ON 05/17/2017  2:00 PM BY [name], [place], DO  -Patient tolerating Zetia and Crestor well. -Encouraged again to drink adequate amounts of water and exercise\walk daily -Discussed low saturated and trans-fat diet.

## 2017-05-17 NOTE — Assessment & Plan Note (Signed)
3-5 min counseling provided Pt prefers to cont "cold [place]".

## 2017-05-17 NOTE — Assessment & Plan Note (Signed)
-  Even though A1c 7.9 today, since starting Victoza and metformin 12-hour formula, blood sugars much better controlled. -Continue current medications and recheck in 3 months. -Reviewed goals with patient and handouts provided with extensive education on

## 2017-05-17 NOTE — Assessment & Plan Note (Signed)
>>  ASSESSMENT AND PLAN FOR CURRENT EVERY WEEK SMOKER- 30 PK YR HX- QUIT OCT 2018; 3-4 CIGARETTES/WEEK OR LESS WRITTEN ON 05/17/2017  1:58 PM BY [name], Malon, DO  Highly encouraged to continue smoking cessation.  Doing great. -Encouraged her to contact support line for help in times of distress etc.

## 2017-05-17 NOTE — Assessment & Plan Note (Signed)
bmi counseling done

## 2017-05-17 NOTE — Assessment & Plan Note (Signed)
Cont to ween daily intake

## 2017-05-17 NOTE — Assessment & Plan Note (Signed)
>>  ASSESSMENT AND PLAN FOR EMPHYSEMA OF LUNG ([place]) WRITTEN ON 05/17/2017  1:57 PM BY [name], Samarra, DO  Symptoms stable, continue Symbicort. -Highly encouraged to continue her smoking cessation.  Congratulated.

## 2017-05-17 NOTE — Patient Instructions (Addendum)
Since she started the Crestor and Zetia about 2 weeks ago, please come in for blood work to check your ALT in approximately 6 weeks after you started the medicine.  This is a lab only visit  Great job and keep up the good work.     Diabetes Mellitus and Standards of Medical Care  Managing diabetes (diabetes mellitus) can be complicated. Your diabetes treatment may be managed by a team of health care providers, including:  A diet and nutrition specialist (registered dietitian).  A nurse.  A certified diabetes educator (CDE).  A diabetes specialist (endocrinologist).  An eye doctor.  A primary care provider.  A dentist.  Your health care providers follow a schedule in order to help you get the best quality of care. The following schedule is a general guideline for your diabetes management plan. Your health care providers may also give you more specific instructions.  HbA1c (hemoglobin A1c) test This test provides information about blood sugar (glucose) control over the previous 2-3 months. It is used to check whether your diabetes management plan needs to be adjusted.  If you are meeting your treatment goals, this test is done at least 2 times a year.  If you are not meeting treatment goals or if your treatment goals have changed, this test is done 4 times a year.  Blood pressure test  This test is done at every routine medical visit. For most people, the goal is less than 130/80. Ask your health care provider what your goal blood pressure should be.  Dental and eye exams  Visit your dentist two times a year.  If you have type 1 diabetes, get an eye exam 3-5 years after you are diagnosed, and then once a year after your first exam. ? If you were diagnosed with type 1 diabetes as a child, get an eye exam when you are age 66 or older and have had diabetes for 3-5 years. After the first exam, you should get an eye exam once a year.  If you have type 2 diabetes, have an eye  exam as soon as you are diagnosed, and then once a year after your first exam.  Foot care exam  Visual foot exams are done at every routine medical visit. The exams check for cuts, bruises, redness, blisters, sores, or other problems with the feet.  A complete foot exam is done by your health care provider once a year. This exam includes an inspection of the structure and skin of your feet, and a check of the pulses and sensation in your feet. ? Type 1 diabetes: Get your first exam 3-5 years after diagnosis. ? Type 2 diabetes: Get your first exam as soon as you are diagnosed.  Check your feet every day for cuts, bruises, redness, blisters, or sores. If you have any of these or other problems that are not healing, contact your health care provider.  Kidney function test (urine microalbumin)  This test is done once a year. ? Type 1 diabetes: Get your first test 5 years after diagnosis. ? Type 2 diabetes: Get your first test as soon as you are diagnosed._  If you have chronic kidney disease (CKD), get a serum creatinine and estimated glomerular filtration rate (eGFR) test once a year.  Lipid profile (cholesterol, HDL, LDL, triglycerides)  This test should be done when you are diagnosed with diabetes, and every 5 years after the first test. If you are on medicines to lower your cholesterol, you may need  to get this test done every year. ? The goal for LDL is less than 100 mg/dL (5.5 mmol/L). If you are at high risk, the goal is less than 70 mg/dL (3.9 mmol/L). ? The goal for HDL is 40 mg/dL (2.2 mmol/L) for men and 50 mg/dL(2.8 mmol/L) for women. An HDL cholesterol of 60 mg/dL (3.3 mmol/L) or higher gives some protection against heart disease. ? The goal for triglycerides is less than 150 mg/dL (8.3 mmol/L).  Immunizations  The yearly flu (influenza) vaccine is recommended for everyone 6 months or older who has diabetes.  The pneumonia (pneumococcal) vaccine is recommended for everyone 2  years or older who has diabetes. If you are 79 or older, you may get the pneumonia vaccine as a series of two separate shots.  The hepatitis B vaccine is recommended for adults shortly after they have been diagnosed with diabetes.  The Tdap (tetanus, diphtheria, and pertussis) vaccine should be given: ? According to normal childhood vaccination schedules, for children. ? Every 10 years, for adults who have diabetes.  The shingles vaccine is recommended for people who have had chicken pox and are 50 years or older.  Mental and emotional health  Screening for symptoms of eating disorders, anxiety, and depression is recommended at the time of diagnosis and afterward as needed. If your screening shows that you have symptoms (you have a positive screening result), you may need further evaluation and be referred to a mental health care provider.  Diabetes self-management education  Education about how to manage your diabetes is recommended at diagnosis and ongoing as needed.  Treatment plan  Your treatment plan will be reviewed at every medical visit.  Summary  Managing diabetes (diabetes mellitus) can be complicated. Your diabetes treatment may be managed by a team of health care providers.  Your health care providers follow a schedule in order to help you get the best quality of care.  Standards of care including having regular physical exams, blood tests, blood pressure monitoring, immunizations, screening tests, and education about how to manage your diabetes.  Your health care providers may also give you more specific instructions based on your individual health.      Type 2 Diabetes Mellitus, Self Care, Adult Caring for yourself after you have been diagnosed with type 2 diabetes (type 2 diabetes mellitus) means keeping your blood sugar (glucose) under control with a balance of:  Nutrition.  Exercise.  Lifestyle changes.  Medicines or insulin, if necessary.  Support from  your team of health care providers and others.  The following information explains what you need to know to manage your diabetes at home. What do I need to do to manage my blood glucose?  Check your blood glucose every day, as often as told by your health care provider.  Contact your health care provider if your blood glucose is above your target for 2 tests in a row.  Have your A1c (hemoglobin A1c) level checked at least two times a year, or as often as told by your health care provider. Your health care provider will set individualized treatment goals for you. Generally, the goal of treatment is to maintain the following blood glucose levels:  Before meals (preprandial): 80-130 mg/dL (4.4-7.2 mmol/L).  After meals (postprandial): below 180 mg/dL (10 mmol/L).  A1c level: less than 7%.  What do I need to know about hyperglycemia and hypoglycemia? What is hyperglycemia? Hyperglycemia, also called high blood glucose, occurs when blood glucose is too high.Make sure you  know the early signs of hyperglycemia, such as:  Increased thirst.  Hunger.  Feeling very tired.  Needing to urinate more often than usual.  Blurry vision.  What is hypoglycemia? Hypoglycemia, also called low blood glucose, occurswith a blood glucose level at or below 70 mg/dL (3.9 mmol/L). The risk for hypoglycemia increases during or after exercise, during sleep, during illness, and when skipping meals or not eating for a long time (fasting). It is important to know the symptoms of hypoglycemia and treat it right away. Always have a 15-gram rapid-acting carbohydrate snack with you to treat low blood glucose. Family members and close friends should also know the symptoms and should understand how to treat hypoglycemia, in case you are not able to treat yourself. What are the symptoms of hypoglycemia? Hypoglycemia symptoms can include:  Hunger.  Anxiety.  Sweating and feeling clammy.  Confusion.  Dizziness  or feeling light-headed.  Sleepiness.  Nausea.  Increased heart rate.  Headache.  Blurry vision.  Seizure.  Nightmares.  Tingling or numbness around the mouth, lips, or tongue.  A change in speech.  Decreased ability to concentrate.  A change in coordination.  Restless sleep.  Tremors or shakes.  Fainting.  Irritability.  How do I treat hypoglycemia?  If you are alert and able to swallow safely, follow the 15:15 rule:  Take 15 grams of a rapid-acting carbohydrate. Rapid-acting options include: ? 1 tube of glucose gel. ? 3 glucose pills. ? 6-8 pieces of hard candy. ? 4 oz (120 mL) of fruit juice. ? 4 oz (120 mL) of regular (not diet) soda.  Check your blood glucose 15 minutes after you take the carbohydrate.  If the repeat blood glucose level is still at or below 70 mg/dL (3.9 mmol/L), take 15 grams of a carbohydrate again.  If your blood glucose level does not increase above 70 mg/dL (3.9 mmol/L) after 3 tries, seek emergency medical care.  After your blood glucose level returns to normal, eat a meal or a snack within 1 hour.  How do I treat severe hypoglycemia? Severe hypoglycemia is when your blood glucose level is at or below 54 mg/dL (3 mmol/L). Severe hypoglycemia is an emergency. Do not wait to see if the symptoms will go away. Get medical help right away. Call your local emergency services (911 in the [place] Do not drive yourself to the hospital. If you have severe hypoglycemia and you cannot eat or drink, you may need an injection of glucagon. A family member or close friend should learn how to check your blood glucose and how to give you a glucagon injection. Ask your health care provider if you need to have an emergency glucagon injection kit available. Severe hypoglycemia may need to be treated in a hospital. The treatment may include getting glucose through an IV tube. You may also need treatment for the cause of your hypoglycemia. Can having  diabetes put me at risk for other conditions? Having diabetes can put you at risk for other long-term (chronic) conditions, such as heart disease and kidney disease. Your health care provider may prescribe medicines to help prevent complications from diabetes. These medicines may include:  Aspirin.  Medicine to lower cholesterol.  Medicine to control blood pressure.  What else can I do to manage my diabetes? Take your diabetes medicines as told  If your health care provider prescribed insulin or diabetes medicines, take them every day.  Do not run out of insulin or other diabetes medicines that you take.  Plan ahead so you always have these available.  If you use insulin, adjust your dosage based on how physically active you are and what foods you eat. Your health care provider will tell you how to adjust your dosage. Make healthy food choices  The things that you eat and drink affect your blood glucose and your insulin dosage. Making good choices helps to control your diabetes and prevent other health problems. A healthy meal plan includes eating lean proteins, complex carbohydrates, fresh fruits and vegetables, low-fat dairy products, and healthy fats. Make an appointment to see a diet and nutrition specialist (registered dietitian) to help you create an eating plan that is right for you. Make sure that you:  Follow instructions from your health care provider about eating or drinking restrictions.  Drink enough fluid to keep your urine clear or pale yellow.  Eat healthy snacks between nutritious meals.  Track the carbohydrates that you eat. Do this by reading food labels and learning the standard serving sizes of foods.  Follow your sick day plan whenever you cannot eat or drink as usual. Make this plan in advance with your health care provider.  Stay active  Exercise regularly, as told by your health care provider. This may include:  Stretching and doing strength exercises, such  as yoga or weightlifting, at least 2 times a week.  Doing at least 150 minutes of moderate-intensity or vigorous-intensity exercise each week. This could be brisk walking, biking, or water aerobics. ? Spread out your activity over at least 3 days of the week. ? Do not go more than 2 days in a row without doing some kind of physical activity.  When you start a new exercise or activity, work with your health care provider to adjust your insulin, medicines, or food intake as needed. Make healthy lifestyle choices  Do not use any tobacco products, such as cigarettes, chewing tobacco, and e-cigarettes. If you need help quitting, ask your health care provider.  If your health care provider says that alcohol is safe for you, limit alcohol intake to no more than 1 drink per day for nonpregnant women and 2 drinks per day for men. One drink equals 12 oz of beer, 5 oz of wine, or 1 oz of hard liquor.  Learn to manage stress. If you need help with this, ask your health care provider. Care for your body   Keep your immunizations up to date. In addition to getting vaccinations as told by your health care provider, it is recommended that you get vaccinated against the following illnesses: ? The flu (influenza). Get a flu shot every year. ? Pneumonia. ? Hepatitis B.  Schedule an eye exam soon after your diagnosis, and then one time every year after that.  Check your skin and feet every day for cuts, bruises, redness, blisters, or sores. Schedule a foot exam with your health care provider once every year.  Brush your teeth and gums two times a day, and floss at least one time a day. Visit your dentist at least once every 6 months.  Maintain a healthy weight. General instructions  Take over-the-counter and prescription medicines only as told by your health care provider.  Share your diabetes management plan with people in your workplace, school, and household.  Check your urine for ketones when you  are ill and as told by your health care provider.  Ask your health care provider: ? Do I need to meet with a diabetes educator? ? Where can I  find a support group for people with diabetes?  Carry a medical alert card or wear medical alert jewelry.  Keep all follow-up visits as told by your health care provider. This is important. Where to find more information: For more information about diabetes, visit:  American Diabetes Association (ADA): www.diabetes.org  American Association of Diabetes Educators (AADE): www.diabeteseducator.org/patient-resources  This information is not intended to replace advice given to you by your health care provider. Make sure you discuss any questions you have with your health care provider. Document Released: 10/19/2015 Document Revised: 12/03/2015 Document Reviewed: 07/31/2015 Elsevier Interactive Patient Education  2017 [place].      Blood Glucose Monitoring, Adult Monitoring your blood sugar (glucose) helps you manage your diabetes. It also helps you and your health care provider determine how well your diabetes management plan is working. Blood glucose monitoring involves checking your blood glucose as often as directed, and keeping a record (log) of your results over time. Why should I monitor my blood glucose? Checking your blood glucose regularly can:  Help you understand how food, exercise, illnesses, and medicines affect your blood glucose.  Let you know what your blood glucose is at any time. You can quickly tell if you are having low blood glucose (hypoglycemia) or high blood glucose (hyperglycemia).  Help you and your health care provider adjust your medicines as needed.  When should I check my blood glucose? Follow instructions from your health care provider about how often to check your blood glucose.   This may depend on:  The type of diabetes you have.  How well-controlled your diabetes is.  Medicines you are taking.  If  you have type 1 diabetes:  Check your blood glucose at least 2 times a day.  Also check your blood glucose: ? Before every insulin injection. ? Before and after exercise. ? Between meals. ? 2 hours after a meal. ? Occasionally between 2:00 a.m. and 3:00 a.m., as directed. ? Before potentially dangerous tasks, like driving or using heavy machinery. ? At bedtime.  You may need to check your blood glucose more often, up to 6-10 times a day: ? If you use an insulin pump. ? If you need multiple daily injections (MDI). ? If your diabetes is not well-controlled. ? If you are ill. ? If you have a history of severe hypoglycemia. ? If you have a history of not knowing when your blood glucose is getting low (hypoglycemia unawareness).  If you have type 2 diabetes:  If you take insulin or other diabetes medicines, check your blood glucose at least 2 times a day.  If you are on intensive insulin therapy, check your blood glucose at least 4 times a day. Occasionally, you may also need to check between 2:00 a.m. and 3:00 a.m., as directed.  Also check your blood glucose: ? Before and after exercise. ? Before potentially dangerous tasks, like driving or using heavy machinery.  You may need to check your blood glucose more often if: ? Your medicine is being adjusted. ? Your diabetes is not well-controlled. ? You are ill.  What is a blood glucose log?  A blood glucose log is a record of your blood glucose readings. It helps you and your health care provider: ? Look for patterns in your blood glucose over time. ? Adjust your diabetes management plan as needed.  Every time you check your blood glucose, write down your result and notes about things that may be affecting your blood glucose, such as  your diet and exercise for the day.  Most glucose meters store a record of glucose readings in the meter. Some meters allow you to download your records to a computer. How do I check my blood  glucose? Follow these steps to get accurate readings of your blood glucose: Supplies needed   Blood glucose meter.  Test strips for your meter. Each meter has its own strips. You must use the strips that come with your meter.  A needle to prick your finger (lancet). Do not use lancets more than once.  A device that holds the lancet (lancing device).  A journal or log book to write down your results.  Procedure  Wash your hands with soap and water.  Prick the side of your finger (not the tip) with the lancet. Use a different finger each time.  Gently rub the finger until a small drop of blood appears.  Follow instructions that come with your meter for inserting the test strip, applying blood to the strip, and using your blood glucose meter.  Write down your result and any notes.  Alternative testing sites  Some meters allow you to use areas of your body other than your finger (alternative sites) to test your blood.  If you think you may have hypoglycemia, or if you have hypoglycemia unawareness, do not use alternative sites. Use your finger instead.  Alternative sites may not be as accurate as the fingers, because blood flow is slower in these areas. This means that the result you get may be delayed, and it may be different from the result that you would get from your finger.  The most common alternative sites are: ? Forearm. ? Thigh. ? Palm of the hand.  Additional tips  Always keep your supplies with you.  If you have questions or need help, all blood glucose meters have a 24-hour "hotline" number that you can call. You may also contact your health care provider.  After you use a few boxes of test strips, adjust (calibrate) your blood glucose meter by following instructions that came with your meter.    The American Diabetes Association suggests the following targets for most nonpregnant adults with diabetes.  More or less stringent glycemic goals may be appropriate  for each individual.  A1C: Less than 7% A1C may also be reported as eAG: Less than 154 mg/dl Before a meal (preprandial plasma glucose): 80-130 mg/dl 1-2 hours after beginning of the meal (Postprandial plasma glucose)*: Less than 180 mg/dl  *Postprandial glucose may be targeted if A1C goals are not met despite reaching preprandial glucose goals.   GOALS in short:  The goals are for the Hgb A1C to be less than 7.0 & blood pressure to be less than 130/80.    It is recommended that all diabetics are educated on and follow a healthy diabetic diet, exercise for 30 minutes 3-4 times per week (walking, biking, swimming, or machine), monitor blood glucose readings and bring that record with you to be reviewed at your next office visit.     You should be checking fasting blood sugars- especially after you eat poorly or eat really healthy, and also check 2 hour postprandial blood sugars after largest meal of the day.    Write these down and bring in your log at each office visit.    You will need to be seen every 3 months by the provider managing your Diabetes unless told otherwise by that provider.   You will need yearly eye exams  from an eye specialist and foot exams to check the nerves of your feet.  Also, your urine should be checked yearly as well to make sure excess protein is not present.   If you are checking your blood pressure at home, please record it and bring it to your next office visit.    Follow the Dietary Approaches to Stop Hypertension (DASH) diet (3 servings of fruit and vegetables daily, whole grains, low sodium, low-fat proteins).  See below.    Lastly, when it comes to your cholesterol, the goal is to have the HDL (good cholesterol) >40, and the LDL (bad cholesterol) <100.   It is recommended that you follow a heart healthy, low saturated and trans-fat diet and exercise for 30 minutes at least 5 times a week.     (( Check out the DASH diet = 1.5 Gram Low Sodium Diet   A 1.5  gram sodium diet restricts the amount of sodium in the diet to no more than 1.5 g or 1500 mg daily.  The American Heart Association recommends Americans over the age of 71 to consume no more than 1500 mg of sodium each day to reduce the risk of developing high blood pressure.  Research also shows that limiting sodium may reduce heart attack and stroke risk.  Many foods contain sodium for flavor and sometimes as a preservative.  When the amount of sodium in a diet needs to be low, it is important to know what to look for when choosing foods and drinks.  The following includes some information and guidelines to help make it easier for you to adapt to a low sodium diet.    QUICK TIPS  Do not add salt to food.  Avoid convenience items and fast food.  Choose unsalted snack foods.  Buy lower sodium products, often labeled as "lower sodium" or "no salt added."  Check food labels to learn how much sodium is in 1 serving.  When eating at a restaurant, ask that your food be prepared with less salt or none, if possible.    READING FOOD LABELS FOR SODIUM INFORMATION  The nutrition facts label is a good place to find how much sodium is in foods. Look for products with no more than 400 mg of sodium per serving.  Remember that 1.5 g = 1500 mg.  The food label may also list foods as:  Sodium-free: Less than 5 mg in a serving.  Very low sodium: 35 mg or less in a serving.  Low-sodium: 140 mg or less in a serving.  Light in sodium: 50% less sodium in a serving. For example, if a food that usually has 300 mg of sodium is changed to become light in sodium, it will have 150 mg of sodium.  Reduced sodium: 25% less sodium in a serving. For example, if a food that usually has 400 mg of sodium is changed to reduced sodium, it will have 300 mg of sodium.    CHOOSING FOODS  Grains  Avoid: Salted crackers and snack items. Some cereals, including instant hot cereals. Bread stuffing and biscuit mixes. Seasoned rice or  pasta mixes.  Choose: Unsalted snack items. Low-sodium cereals, oats, puffed wheat and rice, shredded wheat. English muffins and bread. Pasta.  Meats  Avoid: Salted, canned, smoked, spiced, pickled meats, including fish and poultry. Bacon, ham, sausage, cold cuts, hot dogs, anchovies.  Choose: Low-sodium canned tuna and salmon. Fresh or frozen meat, poultry, and fish.  Dairy  Avoid: Processed cheese  and spreads. Cottage cheese. Buttermilk and condensed milk. Regular cheese.  Choose: Milk. Low-sodium cottage cheese. Yogurt. Sour cream. Low-sodium cheese.  Fruits and Vegetables  Avoid: Regular canned vegetables. Regular canned tomato sauce and paste. Frozen vegetables in sauces. Olives. Angie Fava. Relishes. Sauerkraut.  Choose: Low-sodium canned vegetables. Low-sodium tomato sauce and paste. Frozen or fresh vegetables. Fresh and frozen fruit.  Condiments  Avoid: Canned and packaged gravies. Worcestershire sauce. Tartar sauce. Barbecue sauce. Soy sauce. Steak sauce. Ketchup. Onion, garlic, and table salt. Meat flavorings and tenderizers.  Choose: Fresh and dried herbs and spices. Low-sodium varieties of mustard and ketchup. Lemon juice. Tabasco sauce. Horseradish.    SAMPLE 1.5 GRAM SODIUM MEAL PLAN:   Breakfast / Sodium (mg)  1 cup low-fat milk / 143 mg  1 whole-wheat English muffin / 240 mg  1 tbs heart-healthy margarine / 153 mg  1 hard-boiled egg / 139 mg  1 small orange / 0 mg  Lunch / Sodium (mg)  1 cup raw carrots / 76 mg  2 tbs no salt added peanut butter / 5 mg  2 slices whole-wheat bread / 270 mg  1 tbs jelly / 6 mg   cup red grapes / 2 mg  Dinner / Sodium (mg)  1 cup whole-wheat pasta / 2 mg  1 cup low-sodium tomato sauce / 73 mg  3 oz lean ground beef / 57 mg  1 small side salad (1 cup raw spinach leaves,  cup cucumber,  cup yellow bell pepper) with 1 tsp olive oil and 1 tsp red wine vinegar / 25 mg  Snack / Sodium (mg)  1 container low-fat vanilla yogurt / 107 mg  3  graham cracker squares / 127 mg  Nutrient Analysis  Calories: 1745  Protein: 75 g  Carbohydrate: 237 g  Fat: 57 g  Sodium: 1425 mg  Document Released: 06/27/2005 Document Revised: 03/09/2011 Document Reviewed: 09/28/2009  ExitCare Patient Information 2012 [place].))    This information is not intended to replace advice given to you by your health care provider. Make sure you discuss any questions you have with your health care provider. Document Released: 06/30/2003 Document Revised: 01/15/2016 Document Reviewed: 12/07/2015 Elsevier Interactive Patient Education  2017 [employer].
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
## 2017-05-17 NOTE — Assessment & Plan Note (Addendum)
Symptoms stable, continue Symbicort. -Highly encouraged to continue her smoking cessation.  Congratulated.

## 2017-05-17 NOTE — Assessment & Plan Note (Signed)
Much improved lately- congratulated pt on success

## 2017-05-17 NOTE — Assessment & Plan Note (Signed)
Highly encouraged to continue smoking cessation.  Doing great. -Encouraged her to contact support line for help in times of distress etc.

## 2017-05-17 NOTE — Assessment & Plan Note (Signed)
AHA guidelines r/w pt of 153min/ wk mod intensity exercise.

## 2017-05-17 NOTE — Assessment & Plan Note (Signed)
-  Patient tolerating Zetia and Crestor well. -Encouraged again to drink adequate amounts of water and exercise\walk daily -Discussed low saturated and trans-fat diet.

## 2017-05-17 NOTE — Assessment & Plan Note (Signed)
The patient stated she took Xanax of her sisters a couple of times which worked well. -We had a long discussion about detrimental effects of Xanax use in older folks leading to dementia etc. -Patient states she was just to hold off on starting BuSpar or Lexapro or other for her anxiety and occasional panic attack. -She will let us know if the symptoms become worse and intolerable. -We discussed exercise, deep breathing, meditation etc. as ways to help with her symptoms.

## 2017-05-26 ENCOUNTER — Encounter: Payer: Self-pay | Admitting: Gastroenterology

## 2017-05-30 ENCOUNTER — Ambulatory Visit (INDEPENDENT_AMBULATORY_CARE_PROVIDER_SITE_OTHER): Payer: Medicare HMO | Admitting: Family Medicine

## 2017-05-30 ENCOUNTER — Encounter: Payer: Self-pay | Admitting: Family Medicine

## 2017-05-30 VITALS — BP 106/67 | HR 106 | Ht 61.25 in | Wt 136.0 lb

## 2017-05-30 DIAGNOSIS — Z7984 Long term (current) use of oral hypoglycemic drugs: Secondary | ICD-10-CM

## 2017-05-30 DIAGNOSIS — R42 Dizziness and giddiness: Secondary | ICD-10-CM

## 2017-05-30 DIAGNOSIS — E1100 Type 2 diabetes mellitus with hyperosmolarity without nonketotic hyperglycemic-hyperosmolar coma (NKHHC): Secondary | ICD-10-CM

## 2017-05-30 DIAGNOSIS — R5383 Other fatigue: Secondary | ICD-10-CM

## 2017-05-30 NOTE — Patient Instructions (Addendum)
If you get another symptoms in the near future please call the office, we would want to get you in for an acute appointment and at that time get additional testing such as an EKG.    -Pt declines bldwrk and EKG today.  Says her sister is waiting on her and doesn't want to make her wait.  She understand if she gets these again she should contact us immediately.  ---> Please make sure you keep your appointment in the very near future to discuss the new medicines we put you on for your diabetes and cholesterol.  Please make sure she is taking her Lovaza and Zetia daily.  We need to increase her Crestor from 10 mg nightly to 40 mg.  In addition to her metformin that she is taking 1000 twice daily,  We need to add Victoza to her daily regimen.
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
## 2017-07-23 ENCOUNTER — Other Ambulatory Visit: Payer: Self-pay | Admitting: Family Medicine

## 2017-07-23 DIAGNOSIS — E782 Mixed hyperlipidemia: Secondary | ICD-10-CM

## 2017-07-23 DIAGNOSIS — E1100 Type 2 diabetes mellitus with hyperosmolarity without nonketotic hyperglycemic-hyperosmolar coma (NKHHC): Secondary | ICD-10-CM

## 2017-07-26 ENCOUNTER — Other Ambulatory Visit: Payer: Self-pay | Admitting: Family Medicine

## 2017-07-26 DIAGNOSIS — E1100 Type 2 diabetes mellitus with hyperosmolarity without nonketotic hyperglycemic-hyperosmolar coma (NKHHC): Secondary | ICD-10-CM

## 2017-09-13 ENCOUNTER — Other Ambulatory Visit: Payer: Medicare HMO

## 2017-09-13 DIAGNOSIS — E1100 Type 2 diabetes mellitus with hyperosmolarity without nonketotic hyperglycemic-hyperosmolar coma (NKHHC): Secondary | ICD-10-CM

## 2017-09-13 DIAGNOSIS — E785 Hyperlipidemia, unspecified: Secondary | ICD-10-CM

## 2017-09-13 DIAGNOSIS — E663 Overweight: Secondary | ICD-10-CM

## 2017-09-13 DIAGNOSIS — B0229 Other postherpetic nervous system involvement: Secondary | ICD-10-CM | POA: Diagnosis not present

## 2017-09-13 DIAGNOSIS — Z9081 Acquired absence of spleen: Secondary | ICD-10-CM | POA: Diagnosis not present

## 2017-09-13 DIAGNOSIS — E1169 Type 2 diabetes mellitus with other specified complication: Secondary | ICD-10-CM

## 2017-09-14 LAB — CBC WITH DIFFERENTIAL/PLATELET
BASOS ABS: 0.1 10*3/uL (ref 0.0–0.2)
Basos: 1 %
EOS (ABSOLUTE): 0.3 10*3/uL (ref 0.0–0.4)
Eos: 3 %
Hematocrit: 44.9 % (ref 34.0–46.6)
Hemoglobin: 15.4 g/dL (ref 11.1–15.9)
Immature Grans (Abs): 0 10*3/uL (ref 0.0–0.1)
Immature Granulocytes: 0 %
Lymphocytes Absolute: 4.3 10*3/uL — ABNORMAL HIGH (ref 0.7–3.1)
Lymphs: 44 %
MCH: 32.2 pg (ref 26.6–33.0)
MCHC: 34.3 g/dL (ref 31.5–35.7)
MCV: 94 fL (ref 79–97)
MONOS ABS: 0.7 10*3/uL (ref 0.1–0.9)
Monocytes: 7 %
NEUTROS ABS: 4.5 10*3/uL (ref 1.4–7.0)
NEUTROS PCT: 45 %
PLATELETS: 406 10*3/uL — AB (ref 150–379)
RBC: 4.79 x10E6/uL (ref 3.77–5.28)
RDW: 14.1 % (ref 12.3–15.4)
WBC: 9.9 10*3/uL (ref 3.4–10.8)

## 2017-09-14 LAB — COMPREHENSIVE METABOLIC PANEL
A/G RATIO: 1.6 (ref 1.2–2.2)
ALT: 6 IU/L (ref 0–32)
AST: 11 IU/L (ref 0–40)
Albumin: 4.4 g/dL (ref 3.6–4.8)
Alkaline Phosphatase: 55 IU/L (ref 39–117)
BUN / CREAT RATIO: 13 (ref 12–28)
BUN: 10 mg/dL (ref 8–27)
Bilirubin Total: 0.2 mg/dL (ref 0.0–1.2)
CALCIUM: 9.5 mg/dL (ref 8.7–10.3)
CO2: 23 mmol/L (ref 20–29)
CREATININE: 0.75 mg/dL (ref 0.57–1.00)
Chloride: 103 mmol/L (ref 96–106)
GFR calc Af Amer: 97 mL/min/{1.73_m2} (ref >59)
GFR calc non Af Amer: 84 mL/min/{1.73_m2} (ref >59)
GLOBULIN, TOTAL: 2.7 g/dL (ref 1.5–4.5)
Glucose: 101 mg/dL — ABNORMAL HIGH (ref 65–99)
POTASSIUM: 4.3 mmol/L (ref 3.5–5.2)
SODIUM: 139 mmol/L (ref 134–144)
Total Protein: 7.1 g/dL (ref 6.0–8.5)

## 2017-09-14 LAB — LIPID PANEL
CHOL/HDL RATIO: 5.7 ratio — AB (ref 0.0–4.4)
Cholesterol, Total: 201 mg/dL — ABNORMAL HIGH (ref 100–199)
HDL: 35 mg/dL — ABNORMAL LOW (ref >39)
LDL CALC: 128 mg/dL — AB (ref 0–99)
TRIGLYCERIDES: 188 mg/dL — AB (ref 0–149)
VLDL Cholesterol Cal: 38 mg/dL (ref 5–40)

## 2017-09-14 LAB — HEMOGLOBIN A1C
ESTIMATED AVERAGE GLUCOSE: 123 mg/dL
Hgb A1c MFr Bld: 5.9 % — ABNORMAL HIGH (ref 4.8–5.6)

## 2017-09-20 ENCOUNTER — Encounter: Payer: Self-pay | Admitting: Family Medicine

## 2017-09-20 ENCOUNTER — Ambulatory Visit (INDEPENDENT_AMBULATORY_CARE_PROVIDER_SITE_OTHER): Payer: Medicare HMO | Admitting: Family Medicine

## 2017-09-20 VITALS — BP 112/68 | HR 85 | Ht 61.25 in | Wt 125.0 lb

## 2017-09-20 DIAGNOSIS — Z23 Encounter for immunization: Secondary | ICD-10-CM | POA: Diagnosis not present

## 2017-09-20 DIAGNOSIS — R69 Illness, unspecified: Secondary | ICD-10-CM | POA: Diagnosis not present

## 2017-09-20 DIAGNOSIS — J439 Emphysema, unspecified: Secondary | ICD-10-CM

## 2017-09-20 DIAGNOSIS — Z716 Tobacco abuse counseling: Secondary | ICD-10-CM | POA: Diagnosis not present

## 2017-09-20 DIAGNOSIS — Z723 Lack of physical exercise: Secondary | ICD-10-CM

## 2017-09-20 DIAGNOSIS — E1169 Type 2 diabetes mellitus with other specified complication: Secondary | ICD-10-CM | POA: Diagnosis not present

## 2017-09-20 DIAGNOSIS — E785 Hyperlipidemia, unspecified: Secondary | ICD-10-CM | POA: Diagnosis not present

## 2017-09-20 DIAGNOSIS — F17200 Nicotine dependence, unspecified, uncomplicated: Secondary | ICD-10-CM | POA: Diagnosis not present

## 2017-09-20 DIAGNOSIS — E1100 Type 2 diabetes mellitus with hyperosmolarity without nonketotic hyperglycemic-hyperosmolar coma (NKHHC): Secondary | ICD-10-CM

## 2017-09-20 MED ORDER — METFORMIN HCL 500 MG PO TABS: 500.0000 mg | ORAL_TABLET | Freq: Two times a day (BID) | ORAL | 3 refills | Status: DC
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
## 2017-09-20 NOTE — Patient Instructions (Signed)
Great job with getting your A1c down from the upper sevens to now 5.9!!!!  We will decrease her metformin to 1 tablet twice daily instead of 2 tablets twice daily.   Also please remember to check your blood sugars fasting-first thing in the morning, and also 2 hours after your largest meal which is usually nighttime.  -Continue with the weight control and healthier dietary and lifestyle habits.  Please try to add exercise to your daily regimen as this will help your good cholesterol levels increase and lower your LDL even further.  We will see you in 4 months.

## 2017-09-24 ENCOUNTER — Other Ambulatory Visit: Payer: Self-pay | Admitting: Family Medicine

## 2017-09-24 DIAGNOSIS — E1100 Type 2 diabetes mellitus with hyperosmolarity without nonketotic hyperglycemic-hyperosmolar coma (NKHHC): Secondary | ICD-10-CM

## 2017-11-05 ENCOUNTER — Other Ambulatory Visit: Payer: Self-pay | Admitting: Family Medicine

## 2017-11-05 DIAGNOSIS — B0229 Other postherpetic nervous system involvement: Secondary | ICD-10-CM

## 2017-11-25 ENCOUNTER — Other Ambulatory Visit: Payer: Self-pay | Admitting: Family Medicine

## 2017-11-25 DIAGNOSIS — E1100 Type 2 diabetes mellitus with hyperosmolarity without nonketotic hyperglycemic-hyperosmolar coma (NKHHC): Secondary | ICD-10-CM

## 2018-01-22 ENCOUNTER — Encounter: Payer: Self-pay | Admitting: Family Medicine

## 2018-01-22 ENCOUNTER — Ambulatory Visit (INDEPENDENT_AMBULATORY_CARE_PROVIDER_SITE_OTHER): Payer: Medicare HMO | Admitting: Family Medicine

## 2018-01-22 VITALS — BP 96/59 | HR 86 | Ht 61.0 in | Wt 116.4 lb

## 2018-01-22 DIAGNOSIS — B0229 Other postherpetic nervous system involvement: Secondary | ICD-10-CM

## 2018-01-22 DIAGNOSIS — J439 Emphysema, unspecified: Secondary | ICD-10-CM

## 2018-01-22 DIAGNOSIS — E1100 Type 2 diabetes mellitus with hyperosmolarity without nonketotic hyperglycemic-hyperosmolar coma (NKHHC): Secondary | ICD-10-CM

## 2018-01-22 LAB — POCT GLYCOSYLATED HEMOGLOBIN (HGB A1C): Hemoglobin A1C: 5.5 % (ref 4.0–5.6)

## 2018-01-22 MED ORDER — ALBUTEROL SULFATE HFA 108 (90 BASE) MCG/ACT IN AERS: 1.0000 | INHALATION_SPRAY | RESPIRATORY_TRACT | 2 refills | Status: DC | PRN

## 2018-01-22 MED ORDER — GABAPENTIN 300 MG PO CAPS: 600.0000 mg | ORAL_CAPSULE | Freq: Every day | ORAL | 1 refills | Status: DC

## 2018-01-22 NOTE — Patient Instructions (Signed)
Please take off your Victoza and continue on the metformin 500 twice daily.  Continue to monitor your blood sugars and we will see you in 3 to 4 months sooner if problems or concerns.  Continue to do a great job with your exercise diet and all healthy habits as you are making this happen yourself.  So prior to you.
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
## 2018-03-26 ENCOUNTER — Other Ambulatory Visit: Payer: Self-pay | Admitting: Family Medicine

## 2018-03-26 DIAGNOSIS — E1100 Type 2 diabetes mellitus with hyperosmolarity without nonketotic hyperglycemic-hyperosmolar coma (NKHHC): Secondary | ICD-10-CM

## 2018-03-29 DIAGNOSIS — Q72812 Congenital shortening of left lower limb: Secondary | ICD-10-CM | POA: Diagnosis not present

## 2018-03-29 DIAGNOSIS — M9903 Segmental and somatic dysfunction of lumbar region: Secondary | ICD-10-CM | POA: Diagnosis not present

## 2018-03-29 DIAGNOSIS — M5117 Intervertebral disc disorders with radiculopathy, lumbosacral region: Secondary | ICD-10-CM | POA: Diagnosis not present

## 2018-03-29 DIAGNOSIS — M9905 Segmental and somatic dysfunction of pelvic region: Secondary | ICD-10-CM | POA: Diagnosis not present

## 2018-03-30 DIAGNOSIS — Q72812 Congenital shortening of left lower limb: Secondary | ICD-10-CM | POA: Diagnosis not present

## 2018-03-30 DIAGNOSIS — M5117 Intervertebral disc disorders with radiculopathy, lumbosacral region: Secondary | ICD-10-CM | POA: Diagnosis not present

## 2018-03-30 DIAGNOSIS — M9905 Segmental and somatic dysfunction of pelvic region: Secondary | ICD-10-CM | POA: Diagnosis not present

## 2018-03-30 DIAGNOSIS — M9903 Segmental and somatic dysfunction of lumbar region: Secondary | ICD-10-CM | POA: Diagnosis not present

## 2018-04-02 DIAGNOSIS — Q72812 Congenital shortening of left lower limb: Secondary | ICD-10-CM | POA: Diagnosis not present

## 2018-04-02 DIAGNOSIS — M9903 Segmental and somatic dysfunction of lumbar region: Secondary | ICD-10-CM | POA: Diagnosis not present

## 2018-04-02 DIAGNOSIS — M9905 Segmental and somatic dysfunction of pelvic region: Secondary | ICD-10-CM | POA: Diagnosis not present

## 2018-04-02 DIAGNOSIS — M5117 Intervertebral disc disorders with radiculopathy, lumbosacral region: Secondary | ICD-10-CM | POA: Diagnosis not present

## 2018-04-03 DIAGNOSIS — Q72812 Congenital shortening of left lower limb: Secondary | ICD-10-CM | POA: Diagnosis not present

## 2018-04-03 DIAGNOSIS — M9903 Segmental and somatic dysfunction of lumbar region: Secondary | ICD-10-CM | POA: Diagnosis not present

## 2018-04-03 DIAGNOSIS — M9905 Segmental and somatic dysfunction of pelvic region: Secondary | ICD-10-CM | POA: Diagnosis not present

## 2018-04-03 DIAGNOSIS — M5117 Intervertebral disc disorders with radiculopathy, lumbosacral region: Secondary | ICD-10-CM | POA: Diagnosis not present

## 2018-04-05 DIAGNOSIS — M9903 Segmental and somatic dysfunction of lumbar region: Secondary | ICD-10-CM | POA: Diagnosis not present

## 2018-04-05 DIAGNOSIS — M9905 Segmental and somatic dysfunction of pelvic region: Secondary | ICD-10-CM | POA: Diagnosis not present

## 2018-04-05 DIAGNOSIS — Q72812 Congenital shortening of left lower limb: Secondary | ICD-10-CM | POA: Diagnosis not present

## 2018-04-05 DIAGNOSIS — M5117 Intervertebral disc disorders with radiculopathy, lumbosacral region: Secondary | ICD-10-CM | POA: Diagnosis not present

## 2018-04-08 ENCOUNTER — Other Ambulatory Visit: Payer: Self-pay | Admitting: Family Medicine

## 2018-04-08 DIAGNOSIS — J438 Other emphysema: Secondary | ICD-10-CM

## 2018-04-08 DIAGNOSIS — F17200 Nicotine dependence, unspecified, uncomplicated: Secondary | ICD-10-CM

## 2018-04-09 DIAGNOSIS — Q72812 Congenital shortening of left lower limb: Secondary | ICD-10-CM | POA: Diagnosis not present

## 2018-04-09 DIAGNOSIS — M9903 Segmental and somatic dysfunction of lumbar region: Secondary | ICD-10-CM | POA: Diagnosis not present

## 2018-04-09 DIAGNOSIS — M9905 Segmental and somatic dysfunction of pelvic region: Secondary | ICD-10-CM | POA: Diagnosis not present

## 2018-04-09 DIAGNOSIS — M5117 Intervertebral disc disorders with radiculopathy, lumbosacral region: Secondary | ICD-10-CM | POA: Diagnosis not present

## 2018-04-10 DIAGNOSIS — M5117 Intervertebral disc disorders with radiculopathy, lumbosacral region: Secondary | ICD-10-CM | POA: Diagnosis not present

## 2018-04-10 DIAGNOSIS — Q72812 Congenital shortening of left lower limb: Secondary | ICD-10-CM | POA: Diagnosis not present

## 2018-04-10 DIAGNOSIS — M9905 Segmental and somatic dysfunction of pelvic region: Secondary | ICD-10-CM | POA: Diagnosis not present

## 2018-04-10 DIAGNOSIS — M9903 Segmental and somatic dysfunction of lumbar region: Secondary | ICD-10-CM | POA: Diagnosis not present

## 2018-04-12 DIAGNOSIS — M9905 Segmental and somatic dysfunction of pelvic region: Secondary | ICD-10-CM | POA: Diagnosis not present

## 2018-04-12 DIAGNOSIS — M5117 Intervertebral disc disorders with radiculopathy, lumbosacral region: Secondary | ICD-10-CM | POA: Diagnosis not present

## 2018-04-12 DIAGNOSIS — M9903 Segmental and somatic dysfunction of lumbar region: Secondary | ICD-10-CM | POA: Diagnosis not present

## 2018-04-12 DIAGNOSIS — Q72812 Congenital shortening of left lower limb: Secondary | ICD-10-CM | POA: Diagnosis not present

## 2018-04-17 DIAGNOSIS — M9905 Segmental and somatic dysfunction of pelvic region: Secondary | ICD-10-CM | POA: Diagnosis not present

## 2018-04-17 DIAGNOSIS — M5117 Intervertebral disc disorders with radiculopathy, lumbosacral region: Secondary | ICD-10-CM | POA: Diagnosis not present

## 2018-04-17 DIAGNOSIS — Q72812 Congenital shortening of left lower limb: Secondary | ICD-10-CM | POA: Diagnosis not present

## 2018-04-17 DIAGNOSIS — M9903 Segmental and somatic dysfunction of lumbar region: Secondary | ICD-10-CM | POA: Diagnosis not present

## 2018-04-18 DIAGNOSIS — M5117 Intervertebral disc disorders with radiculopathy, lumbosacral region: Secondary | ICD-10-CM | POA: Diagnosis not present

## 2018-04-18 DIAGNOSIS — M9903 Segmental and somatic dysfunction of lumbar region: Secondary | ICD-10-CM | POA: Diagnosis not present

## 2018-04-18 DIAGNOSIS — M9905 Segmental and somatic dysfunction of pelvic region: Secondary | ICD-10-CM | POA: Diagnosis not present

## 2018-04-18 DIAGNOSIS — Q72812 Congenital shortening of left lower limb: Secondary | ICD-10-CM | POA: Diagnosis not present

## 2018-04-19 DIAGNOSIS — M9905 Segmental and somatic dysfunction of pelvic region: Secondary | ICD-10-CM | POA: Diagnosis not present

## 2018-04-19 DIAGNOSIS — M9903 Segmental and somatic dysfunction of lumbar region: Secondary | ICD-10-CM | POA: Diagnosis not present

## 2018-04-19 DIAGNOSIS — Q72812 Congenital shortening of left lower limb: Secondary | ICD-10-CM | POA: Diagnosis not present

## 2018-04-19 DIAGNOSIS — M5117 Intervertebral disc disorders with radiculopathy, lumbosacral region: Secondary | ICD-10-CM | POA: Diagnosis not present

## 2018-04-23 DIAGNOSIS — M9905 Segmental and somatic dysfunction of pelvic region: Secondary | ICD-10-CM | POA: Diagnosis not present

## 2018-04-23 DIAGNOSIS — M9903 Segmental and somatic dysfunction of lumbar region: Secondary | ICD-10-CM | POA: Diagnosis not present

## 2018-04-23 DIAGNOSIS — Q72812 Congenital shortening of left lower limb: Secondary | ICD-10-CM | POA: Diagnosis not present

## 2018-04-23 DIAGNOSIS — M5117 Intervertebral disc disorders with radiculopathy, lumbosacral region: Secondary | ICD-10-CM | POA: Diagnosis not present

## 2018-04-24 DIAGNOSIS — Q72812 Congenital shortening of left lower limb: Secondary | ICD-10-CM | POA: Diagnosis not present

## 2018-04-24 DIAGNOSIS — M5117 Intervertebral disc disorders with radiculopathy, lumbosacral region: Secondary | ICD-10-CM | POA: Diagnosis not present

## 2018-04-24 DIAGNOSIS — M9903 Segmental and somatic dysfunction of lumbar region: Secondary | ICD-10-CM | POA: Diagnosis not present

## 2018-04-24 DIAGNOSIS — M9905 Segmental and somatic dysfunction of pelvic region: Secondary | ICD-10-CM | POA: Diagnosis not present

## 2018-04-26 DIAGNOSIS — M9903 Segmental and somatic dysfunction of lumbar region: Secondary | ICD-10-CM | POA: Diagnosis not present

## 2018-04-26 DIAGNOSIS — M9905 Segmental and somatic dysfunction of pelvic region: Secondary | ICD-10-CM | POA: Diagnosis not present

## 2018-04-26 DIAGNOSIS — M5117 Intervertebral disc disorders with radiculopathy, lumbosacral region: Secondary | ICD-10-CM | POA: Diagnosis not present

## 2018-04-26 DIAGNOSIS — Q72812 Congenital shortening of left lower limb: Secondary | ICD-10-CM | POA: Diagnosis not present

## 2018-05-01 DIAGNOSIS — M5117 Intervertebral disc disorders with radiculopathy, lumbosacral region: Secondary | ICD-10-CM | POA: Diagnosis not present

## 2018-05-01 DIAGNOSIS — M9903 Segmental and somatic dysfunction of lumbar region: Secondary | ICD-10-CM | POA: Diagnosis not present

## 2018-05-01 DIAGNOSIS — Q72812 Congenital shortening of left lower limb: Secondary | ICD-10-CM | POA: Diagnosis not present

## 2018-05-01 DIAGNOSIS — M47816 Spondylosis without myelopathy or radiculopathy, lumbar region: Secondary | ICD-10-CM | POA: Diagnosis not present

## 2018-05-01 DIAGNOSIS — M9905 Segmental and somatic dysfunction of pelvic region: Secondary | ICD-10-CM | POA: Diagnosis not present

## 2018-05-03 DIAGNOSIS — M9903 Segmental and somatic dysfunction of lumbar region: Secondary | ICD-10-CM | POA: Diagnosis not present

## 2018-05-03 DIAGNOSIS — M9905 Segmental and somatic dysfunction of pelvic region: Secondary | ICD-10-CM | POA: Diagnosis not present

## 2018-05-03 DIAGNOSIS — M5117 Intervertebral disc disorders with radiculopathy, lumbosacral region: Secondary | ICD-10-CM | POA: Diagnosis not present

## 2018-05-03 DIAGNOSIS — Q72812 Congenital shortening of left lower limb: Secondary | ICD-10-CM | POA: Diagnosis not present

## 2018-05-08 DIAGNOSIS — M9905 Segmental and somatic dysfunction of pelvic region: Secondary | ICD-10-CM | POA: Diagnosis not present

## 2018-05-08 DIAGNOSIS — M5117 Intervertebral disc disorders with radiculopathy, lumbosacral region: Secondary | ICD-10-CM | POA: Diagnosis not present

## 2018-05-08 DIAGNOSIS — M9903 Segmental and somatic dysfunction of lumbar region: Secondary | ICD-10-CM | POA: Diagnosis not present

## 2018-05-08 DIAGNOSIS — Q72812 Congenital shortening of left lower limb: Secondary | ICD-10-CM | POA: Diagnosis not present

## 2018-05-10 DIAGNOSIS — M5117 Intervertebral disc disorders with radiculopathy, lumbosacral region: Secondary | ICD-10-CM | POA: Diagnosis not present

## 2018-05-10 DIAGNOSIS — Q72812 Congenital shortening of left lower limb: Secondary | ICD-10-CM | POA: Diagnosis not present

## 2018-05-10 DIAGNOSIS — M9905 Segmental and somatic dysfunction of pelvic region: Secondary | ICD-10-CM | POA: Diagnosis not present

## 2018-05-10 DIAGNOSIS — M9903 Segmental and somatic dysfunction of lumbar region: Secondary | ICD-10-CM | POA: Diagnosis not present

## 2018-05-23 ENCOUNTER — Ambulatory Visit (INDEPENDENT_AMBULATORY_CARE_PROVIDER_SITE_OTHER): Payer: Medicare HMO | Admitting: Family Medicine

## 2018-05-23 ENCOUNTER — Encounter: Payer: Self-pay | Admitting: Family Medicine

## 2018-05-23 VITALS — BP 115/73 | HR 78 | Temp 98.0°F | Ht 61.0 in | Wt 121.7 lb

## 2018-05-23 DIAGNOSIS — Z9081 Acquired absence of spleen: Secondary | ICD-10-CM | POA: Diagnosis not present

## 2018-05-23 DIAGNOSIS — F17200 Nicotine dependence, unspecified, uncomplicated: Secondary | ICD-10-CM

## 2018-05-23 DIAGNOSIS — E785 Hyperlipidemia, unspecified: Secondary | ICD-10-CM | POA: Diagnosis not present

## 2018-05-23 DIAGNOSIS — E1169 Type 2 diabetes mellitus with other specified complication: Secondary | ICD-10-CM | POA: Diagnosis not present

## 2018-05-23 DIAGNOSIS — E1100 Type 2 diabetes mellitus with hyperosmolarity without nonketotic hyperglycemic-hyperosmolar coma (NKHHC): Secondary | ICD-10-CM | POA: Diagnosis not present

## 2018-05-23 DIAGNOSIS — Z716 Tobacco abuse counseling: Secondary | ICD-10-CM | POA: Diagnosis not present

## 2018-05-23 DIAGNOSIS — R69 Illness, unspecified: Secondary | ICD-10-CM | POA: Diagnosis not present

## 2018-05-23 LAB — POCT UA - MICROALBUMIN
CREATININE, POC: 50 mg/dL
Microalbumin Ur, POC: 10 mg/L

## 2018-05-23 LAB — POCT GLYCOSYLATED HEMOGLOBIN (HGB A1C): Hemoglobin A1C: 5.3 % (ref 4.0–5.6)

## 2018-05-23 MED ORDER — LOSARTAN POTASSIUM 25 MG PO TABS: 25.0000 mg | ORAL_TABLET | Freq: Every day | ORAL | 3 refills | Status: DC

## 2018-05-23 NOTE — Patient Instructions (Addendum)
You will be started on Losartan an ARB medication to aid with protecting your kidneys.   Start with 0.5 tablet while watching your blood pressure and keep a log of your blood pressure. Continue with 0.5 tablet for 1 week and if your blood pressure is holding steady then progress to a full tablet.    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 Your diabetes treatment may be managed by a team of health care providers, including:  A diet and nutrition specialist (registered dietitian).  A nurse.  A certified diabetes educator (CDE).  A diabetes specialist (endocrinologist).  An eye doctor.  A primary care provider.  A dentist.  Your health care providers follow a schedule in order to help you get the best quality of care. The following schedule is a general guideline for your diabetes management plan. Your health care providers may also give you more specific instructions.  HbA1c (hemoglobin A1c) test This test provides information about blood sugar (glucose) control over the previous 2-3 months. It is used to check whether your diabetes management plan needs to be adjusted.  If you are meeting your treatment goals, this test is done at least 2 times a year.  If you are not meeting treatment goals or if your treatment goals have changed, this test is done 4 times a year.  Blood pressure test  This test is done at every routine medical visit. For most people, the goal is less than 130/80. Ask your health care provider what your goal blood pressure should be.  Dental and eye exams  Visit your dentist two times a year.  If you have type 1 diabetes, get an eye exam 3-5 years after you are diagnosed, and then once a year after your first exam. ? If you were diagnosed with type 1 diabetes as a child, get an eye exam when  you are age 50 or older and have had diabetes for 3-5 years. After the first exam, you should get an eye exam once a year.  If you have type 2 diabetes, have an eye exam as soon as you are diagnosed, and then once a year after your first exam.  Foot care exam  Visual foot exams are done at every routine medical visit. The exams check for cuts, bruises, redness, blisters, sores, or other problems with the feet.  A complete foot exam is done by your health care provider once a year. This exam includes an inspection of the structure and skin of your feet, and a check of the pulses and sensation in your feet. ? Type 1 diabetes: Get your first exam 3-5 years after diagnosis. ? Type 2 diabetes: Get your first exam as soon as you are diagnosed.  Check your feet every day for cuts, bruises, redness, blisters, or sores. If you have any of these or other problems that are not healing, contact your health care provider.  Kidney function test (urine microalbumin)  This test is done once a year. ? Type 1 diabetes: Get your first test 5 years after diagnosis. ? Type 2 diabetes: Get your first test as soon as you are diagnosed._  If you have chronic kidney disease (CKD), get a serum creatinine and estimated glomerular filtration rate (eGFR) test once a year.  Lipid profile (cholesterol, HDL, LDL, triglycerides)  This  test should be done when you are diagnosed with diabetes, and every 5 years after the first test. If you are on medicines to lower your cholesterol, you may need to get this test done every year. ? The goal for LDL is less than 100 mg/dL (5.5 mmol/L). If you are at high risk, the goal is less than 70 mg/dL (3.9 mmol/L). ? The goal for HDL is 40 mg/dL (2.2 mmol/L) for men and 50 mg/dL(2.8 mmol/L) for women. An HDL cholesterol of 60 mg/dL (3.3 mmol/L) or higher gives some protection against heart disease. ? The goal for triglycerides is less than 150 mg/dL (8.3 mmol/L).  Immunizations  The  yearly flu (influenza) vaccine is recommended for everyone 6 months or older who has diabetes.  The pneumonia (pneumococcal) vaccine is recommended for everyone 2 years or older who has diabetes. If you are 94 or older, you may get the pneumonia vaccine as a series of two separate shots.  The hepatitis B vaccine is recommended for adults shortly after they have been diagnosed with diabetes.  The Tdap (tetanus, diphtheria, and pertussis) vaccine should be given: ? According to normal childhood vaccination schedules, for children. ? Every 10 years, for adults who have diabetes.  The shingles vaccine is recommended for people who have had chicken pox and are 50 years or older.  Mental and emotional health  Screening for symptoms of eating disorders, anxiety, and depression is recommended at the time of diagnosis and afterward as needed. If your screening shows that you have symptoms (you have a positive screening result), you may need further evaluation and be referred to a mental health care provider.  Diabetes self-management education  Education about how to manage your diabetes is recommended at diagnosis and ongoing as needed.  Treatment plan  Your treatment plan will be reviewed at every medical visit.  Summary  Managing diabetes (diabetes mellitus) can be complicated. Your diabetes treatment may be managed by a team of health care providers.  Your health care providers follow a schedule in order to help you get the best quality of care.  Standards of care including having regular physical exams, blood tests, blood pressure monitoring, immunizations, screening tests, and education about how to manage your diabetes.  Your health care providers may also give you more specific instructions based on your individual health.      Type 2 Diabetes Mellitus, Self Care, Adult Caring for yourself after you have been diagnosed with type 2 diabetes (type 2 diabetes mellitus) means  keeping your blood sugar (glucose) under control with a balance of:  Nutrition.  Exercise.  Lifestyle changes.  Medicines or insulin, if necessary.  Support from your team of health care providers and others.  The following information explains what you need to know to manage your diabetes at home. What do I need to do to manage my blood glucose?  Check your blood glucose every day, as often as told by your health care provider.  Contact your health care provider if your blood glucose is above your target for 2 tests in a row.  Have your A1c (hemoglobin A1c) level checked at least two times a year, or as often as told by your health care provider. Your health care provider will set individualized treatment goals for you. Generally, the goal of treatment is to maintain the following blood glucose levels:  Before meals (preprandial): 80-130 mg/dL (4.4-7.2 mmol/L).  After meals (postprandial): below 180 mg/dL (10 mmol/L).  A1c level: less than  7%.  What do I need to know about hyperglycemia and hypoglycemia? What is hyperglycemia? Hyperglycemia, also called high blood glucose, occurs when blood glucose is too high.Make sure you know the early signs of hyperglycemia, such as:  Increased thirst.  Hunger.  Feeling very tired.  Needing to urinate more often than usual.  Blurry vision.  What is hypoglycemia? Hypoglycemia, also called low blood glucose, occurswith a blood glucose level at or below 70 mg/dL (3.9 mmol/L). The risk for hypoglycemia increases during or after exercise, during sleep, during illness, and when skipping meals or not eating for a long time (fasting). It is important to know the symptoms of hypoglycemia and treat it right away. Always have a 15-gram rapid-acting carbohydrate snack with you to treat low blood glucose. Family members and close friends should also know the symptoms and should understand how to treat hypoglycemia, in case you are not able to  treat yourself. What are the symptoms of hypoglycemia? Hypoglycemia symptoms can include:  Hunger.  Anxiety.  Sweating and feeling clammy.  Confusion.  Dizziness or feeling light-headed.  Sleepiness.  Nausea.  Increased heart rate.  Headache.  Blurry vision.  Seizure.  Nightmares.  Tingling or numbness around the mouth, lips, or tongue.  A change in speech.  Decreased ability to concentrate.  A change in coordination.  Restless sleep.  Tremors or shakes.  Fainting.  Irritability.  How do I treat hypoglycemia?  If you are alert and able to swallow safely, follow the 15:15 rule:  Take 15 grams of a rapid-acting carbohydrate. Rapid-acting options include: ? 1 tube of glucose gel. ? 3 glucose pills. ? 6-8 pieces of hard candy. ? 4 oz (120 mL) of fruit juice. ? 4 oz (120 mL) of regular (not diet) soda.  Check your blood glucose 15 minutes after you take the carbohydrate.  If the repeat blood glucose level is still at or below 70 mg/dL (3.9 mmol/L), take 15 grams of a carbohydrate again.  If your blood glucose level does not increase above 70 mg/dL (3.9 mmol/L) after 3 tries, seek emergency medical care.  After your blood glucose level returns to normal, eat a meal or a snack within 1 hour.  How do I treat severe hypoglycemia? Severe hypoglycemia is when your blood glucose level is at or below 54 mg/dL (3 mmol/L). Severe hypoglycemia is an emergency. Do not wait to see if the symptoms will go away. Get medical help right away. Call your local emergency services (911 in the [place] Do not drive yourself to the hospital. If you have severe hypoglycemia and you cannot eat or drink, you may need an injection of glucagon. A family member or close friend should learn how to check your blood glucose and how to give you a glucagon injection. Ask your health care provider if you need to have an emergency glucagon injection kit available. Severe hypoglycemia may need  to be treated in a hospital. The treatment may include getting glucose through an IV tube. You may also need treatment for the cause of your hypoglycemia. Can having diabetes put me at risk for other conditions? Having diabetes can put you at risk for other long-term (chronic) conditions, such as heart disease and kidney disease. Your health care provider may prescribe medicines to help prevent complications from diabetes. These medicines may include:  Aspirin.  Medicine to lower cholesterol.  Medicine to control blood pressure.  What else can I do to manage my diabetes? Take your diabetes medicines as  told  If your health care provider prescribed insulin or diabetes medicines, take them every day.  Do not run out of insulin or other diabetes medicines that you take. Plan ahead so you always have these available.  If you use insulin, adjust your dosage based on how physically active you are and what foods you eat. Your health care provider will tell you how to adjust your dosage. Make healthy food choices  The things that you eat and drink affect your blood glucose and your insulin dosage. Making good choices helps to control your diabetes and prevent other health problems. A healthy meal plan includes eating lean proteins, complex carbohydrates, fresh fruits and vegetables, low-fat dairy products, and healthy fats. Make an appointment to see a diet and nutrition specialist (registered dietitian) to help you create an eating plan that is right for you. Make sure that you:  Follow instructions from your health care provider about eating or drinking restrictions.  Drink enough fluid to keep your urine clear or pale yellow.  Eat healthy snacks between nutritious meals.  Track the carbohydrates that you eat. Do this by reading food labels and learning the standard serving sizes of foods.  Follow your sick day plan whenever you cannot eat or drink as usual. Make this plan in advance with  your health care provider.  Stay active  Exercise regularly, as told by your health care provider. This may include:  Stretching and doing strength exercises, such as yoga or weightlifting, at least 2 times a week.  Doing at least 150 minutes of moderate-intensity or vigorous-intensity exercise each week. This could be brisk walking, biking, or water aerobics. ? Spread out your activity over at least 3 days of the week. ? Do not go more than 2 days in a row without doing some kind of physical activity.  When you start a new exercise or activity, work with your health care provider to adjust your insulin, medicines, or food intake as needed. Make healthy lifestyle choices  Do not use any tobacco products, such as cigarettes, chewing tobacco, and e-cigarettes. If you need help quitting, ask your health care provider.  If your health care provider says that alcohol is safe for you, limit alcohol intake to no more than 1 drink per day for nonpregnant women and 2 drinks per day for men. One drink equals 12 oz of beer, 5 oz of wine, or 1 oz of hard liquor.  Learn to manage stress. If you need help with this, ask your health care provider. Care for your body   Keep your immunizations up to date. In addition to getting vaccinations as told by your health care provider, it is recommended that you get vaccinated against the following illnesses: ? The flu (influenza). Get a flu shot every year. ? Pneumonia. ? Hepatitis B.  Schedule an eye exam soon after your diagnosis, and then one time every year after that.  Check your skin and feet every day for cuts, bruises, redness, blisters, or sores. Schedule a foot exam with your health care provider once every year.  Brush your teeth and gums two times a day, and floss at least one time a day. Visit your dentist at least once every 6 months.  Maintain a healthy weight. General instructions  Take over-the-counter and prescription medicines only  as told by your health care provider.  Share your diabetes management plan with people in your workplace, school, and household.  Check your urine for ketones when you  are ill and as told by your health care provider.  Ask your health care provider: ? Do I need to meet with a diabetes educator? ? Where can I find a support group for people with diabetes?  Carry a medical alert card or wear medical alert jewelry.  Keep all follow-up visits as told by your health care provider. This is important. Where to find more information: For more information about diabetes, visit:  American Diabetes Association (ADA): www.diabetes.org  American Association of Diabetes Educators (AADE): www.diabeteseducator.org/patient-resources  This information is not intended to replace advice given to you by your health care provider. Make sure you discuss any questions you have with your health care provider. Document Released: 10/19/2015 Document Revised: 12/03/2015 Document Reviewed: 07/31/2015 Elsevier Interactive Patient Education  2017 [place].      Blood Glucose Monitoring, Adult Monitoring your blood sugar (glucose) helps you manage your diabetes. It also helps you and your health care provider determine how well your diabetes management plan is working. Blood glucose monitoring involves checking your blood glucose as often as directed, and keeping a record (log) of your results over time. Why should I monitor my blood glucose? Checking your blood glucose regularly can:  Help you understand how food, exercise, illnesses, and medicines affect your blood glucose.  Let you know what your blood glucose is at any time. You can quickly tell if you are having low blood glucose (hypoglycemia) or high blood glucose (hyperglycemia).  Help you and your health care provider adjust your medicines as needed.  When should I check my blood glucose? Follow instructions from your health care provider about  how often to check your blood glucose.   This may depend on:  The type of diabetes you have.  How well-controlled your diabetes is.  Medicines you are taking.  If you have type 1 diabetes:  Check your blood glucose at least 2 times a day.  Also check your blood glucose: ? Before every insulin injection. ? Before and after exercise. ? Between meals. ? 2 hours after a meal. ? Occasionally between 2:00 a.m. and 3:00 a.m., as directed. ? Before potentially dangerous tasks, like driving or using heavy machinery. ? At bedtime.  You may need to check your blood glucose more often, up to 6-10 times a day: ? If you use an insulin pump. ? If you need multiple daily injections (MDI). ? If your diabetes is not well-controlled. ? If you are ill. ? If you have a history of severe hypoglycemia. ? If you have a history of not knowing when your blood glucose is getting low (hypoglycemia unawareness).  If you have type 2 diabetes:  If you take insulin or other diabetes medicines, check your blood glucose at least 2 times a day.  If you are on intensive insulin therapy, check your blood glucose at least 4 times a day. Occasionally, you may also need to check between 2:00 a.m. and 3:00 a.m., as directed.  Also check your blood glucose: ? Before and after exercise. ? Before potentially dangerous tasks, like driving or using heavy machinery.  You may need to check your blood glucose more often if: ? Your medicine is being adjusted. ? Your diabetes is not well-controlled. ? You are ill.  What is a blood glucose log?  A blood glucose log is a record of your blood glucose readings. It helps you and your health care provider: ? Look for patterns in your blood glucose over time. ? Adjust your  diabetes management plan as needed.  Every time you check your blood glucose, write down your result and notes about things that may be affecting your blood glucose, such as your diet and exercise for the  day.  Most glucose meters store a record of glucose readings in the meter. Some meters allow you to download your records to a computer. How do I check my blood glucose? Follow these steps to get accurate readings of your blood glucose: Supplies needed   Blood glucose meter.  Test strips for your meter. Each meter has its own strips. You must use the strips that come with your meter.  A needle to prick your finger (lancet). Do not use lancets more than once.  A device that holds the lancet (lancing device).  A journal or log book to write down your results.  Procedure  Wash your hands with soap and water.  Prick the side of your finger (not the tip) with the lancet. Use a different finger each time.  Gently rub the finger until a small drop of blood appears.  Follow instructions that come with your meter for inserting the test strip, applying blood to the strip, and using your blood glucose meter.  Write down your result and any notes.  Alternative testing sites  Some meters allow you to use areas of your body other than your finger (alternative sites) to test your blood.  If you think you may have hypoglycemia, or if you have hypoglycemia unawareness, do not use alternative sites. Use your finger instead.  Alternative sites may not be as accurate as the fingers, because blood flow is slower in these areas. This means that the result you get may be delayed, and it may be different from the result that you would get from your finger.  The most common alternative sites are: ? Forearm. ? Thigh. ? Palm of the hand.  Additional tips  Always keep your supplies with you.  If you have questions or need help, all blood glucose meters have a 24-hour hotline number that you can call. You may also contact your health care provider.  After you use a few boxes of test strips, adjust (calibrate) your blood glucose meter by following instructions that came with your  meter.    The American Diabetes Association suggests the following targets for most nonpregnant adults with diabetes.  More or less stringent glycemic goals may be appropriate for each individual.  A1C: Less than 7% A1C may also be reported as eAG: Less than 154 mg/dl Before a meal (preprandial plasma glucose): 80-130 mg/dl 1-2 hours after beginning of the meal (Postprandial plasma glucose)*: Less than 180 mg/dl  *Postprandial glucose may be targeted if A1C goals are not met despite reaching preprandial glucose goals.   GOALS in short:  The goals are for the Hgb A1C to be less than 7.0 & blood pressure to be less than 130/80.    It is recommended that all diabetics are educated on and follow a healthy diabetic diet, exercise for 30 minutes 3-4 times per week (walking, biking, swimming, or machine), monitor blood glucose readings and bring that record with you to be reviewed at your next office visit.     You should be checking fasting blood sugars- especially after you eat poorly or eat really healthy, and also check 2 hour postprandial blood sugars after largest meal of the day.    Write these down and bring in your log at each office visit.  You will need to be seen every 3 months by the provider managing your Diabetes unless told otherwise by that provider.   You will need yearly eye exams from an eye specialist and foot exams to check the nerves of your feet.  Also, your urine should be checked yearly as well to make sure excess protein is not present.   If you are checking your blood pressure at home, please record it and bring it to your next office visit.    Follow the Dietary Approaches to Stop Hypertension (DASH) diet (3 servings of fruit and vegetables daily, whole grains, low sodium, low-fat proteins).  See below.    Lastly, when it comes to your cholesterol, the goal is to have the HDL (good cholesterol) >40, and the LDL (bad cholesterol) <100.   It is recommended that you  follow a heart healthy, low saturated and trans-fat diet and exercise for 30 minutes at least 5 times a week.     (( Check out the DASH diet = 1.5 Gram Low Sodium Diet   A 1.5 gram sodium diet restricts the amount of sodium in the diet to no more than 1.5 g or 1500 mg daily.  The American Heart Association recommends Americans over the age of 67 to consume no more than 1500 mg of sodium each day to reduce the risk of developing high blood pressure.  Research also shows that limiting sodium may reduce heart attack and stroke risk.  Many foods contain sodium for flavor and sometimes as a preservative.  When the amount of sodium in a diet needs to be low, it is important to know what to look for when choosing foods and drinks.  The following includes some information and guidelines to help make it easier for you to adapt to a low sodium diet.    QUICK TIPS  Do not add salt to food.  Avoid convenience items and fast food.  Choose unsalted snack foods.  Buy lower sodium products, often labeled as "lower sodium" or "no salt added."  Check food labels to learn how much sodium is in 1 serving.  When eating at a restaurant, ask that your food be prepared with less salt or none, if possible.    READING FOOD LABELS FOR SODIUM INFORMATION  The nutrition facts label is a good place to find how much sodium is in foods. Look for products with no more than 400 mg of sodium per serving.  Remember that 1.5 g = 1500 mg.  The food label may also list foods as:  Sodium-free: Less than 5 mg in a serving.  Very low sodium: 35 mg or less in a serving.  Low-sodium: 140 mg or less in a serving.  Light in sodium: 50% less sodium in a serving. For example, if a food that usually has 300 mg of sodium is changed to become light in sodium, it will have 150 mg of sodium.  Reduced sodium: 25% less sodium in a serving. For example, if a food that usually has 400 mg of sodium is changed to reduced sodium, it will have 300 mg  of sodium.    CHOOSING FOODS  Grains  Avoid: Salted crackers and snack items. Some cereals, including instant hot cereals. Bread stuffing and biscuit mixes. Seasoned rice or pasta mixes.  Choose: Unsalted snack items. Low-sodium cereals, oats, puffed wheat and rice, shredded wheat. English muffins and bread. Pasta.  Meats  Avoid: Salted, canned, smoked, spiced, pickled meats, including fish and  poultry. Bacon, ham, sausage, cold cuts, hot dogs, anchovies.  Choose: Low-sodium canned tuna and salmon. Fresh or frozen meat, poultry, and fish.  Dairy  Avoid: Processed cheese and spreads. Cottage cheese. Buttermilk and condensed milk. Regular cheese.  Choose: Milk. Low-sodium cottage cheese. Yogurt. Sour cream. Low-sodium cheese.  Fruits and Vegetables  Avoid: Regular canned vegetables. Regular canned tomato sauce and paste. Frozen vegetables in sauces. Olives. Angie Fava. Relishes. Sauerkraut.  Choose: Low-sodium canned vegetables. Low-sodium tomato sauce and paste. Frozen or fresh vegetables. Fresh and frozen fruit.  Condiments  Avoid: Canned and packaged gravies. Worcestershire sauce. Tartar sauce. Barbecue sauce. Soy sauce. Steak sauce. Ketchup. Onion, garlic, and table salt. Meat flavorings and tenderizers.  Choose: Fresh and dried herbs and spices. Low-sodium varieties of mustard and ketchup. Lemon juice. Tabasco sauce. Horseradish.    SAMPLE 1.5 GRAM SODIUM MEAL PLAN:   Breakfast / Sodium (mg)  1 cup low-fat milk / 143 mg  1 whole-wheat English muffin / 240 mg  1 tbs heart-healthy margarine / 153 mg  1 hard-boiled egg / 139 mg  1 small orange / 0 mg  Lunch / Sodium (mg)  1 cup raw carrots / 76 mg  2 tbs no salt added peanut butter / 5 mg  2 slices whole-wheat bread / 270 mg  1 tbs jelly / 6 mg   cup red grapes / 2 mg  Dinner / Sodium (mg)  1 cup whole-wheat pasta / 2 mg  1 cup low-sodium tomato sauce / 73 mg  3 oz lean ground beef / 57 mg  1 small side salad (1 cup raw spinach  leaves,  cup cucumber,  cup yellow bell pepper) with 1 tsp olive oil and 1 tsp red wine vinegar / 25 mg  Snack / Sodium (mg)  1 container low-fat vanilla yogurt / 107 mg  3 graham cracker squares / 127 mg  Nutrient Analysis  Calories: 1745  Protein: 75 g  Carbohydrate: 237 g  Fat: 57 g  Sodium: 1425 mg  Document Released: 06/27/2005 Document Revised: 03/09/2011 Document Reviewed: 09/28/2009  ExitCare Patient Information 2012 [place].))    This information is not intended to replace advice given to you by your health care provider. Make sure you discuss any questions you have with your health care provider. Document Released: 06/30/2003 Document Revised: 01/15/2016 Document Reviewed: 12/07/2015 Elsevier Interactive Patient Education  2017 [employer].

## 2018-05-23 NOTE — Addendum Note (Signed)
Addended by: Lanier Prude D on: 05/23/2018 09:36 AM   Modules accepted: Orders
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
## 2018-06-03 ENCOUNTER — Other Ambulatory Visit: Payer: Self-pay | Admitting: Family Medicine

## 2018-06-03 DIAGNOSIS — B0229 Other postherpetic nervous system involvement: Secondary | ICD-10-CM

## 2018-08-21 DIAGNOSIS — R69 Illness, unspecified: Secondary | ICD-10-CM | POA: Diagnosis not present

## 2018-09-09 ENCOUNTER — Other Ambulatory Visit: Payer: Self-pay | Admitting: Family Medicine

## 2018-09-09 DIAGNOSIS — E1100 Type 2 diabetes mellitus with hyperosmolarity without nonketotic hyperglycemic-hyperosmolar coma (NKHHC): Secondary | ICD-10-CM

## 2018-09-21 ENCOUNTER — Ambulatory Visit: Payer: Medicare HMO | Admitting: Family Medicine

## 2018-10-10 ENCOUNTER — Ambulatory Visit (INDEPENDENT_AMBULATORY_CARE_PROVIDER_SITE_OTHER): Payer: Medicare HMO | Admitting: Family Medicine

## 2018-10-10 ENCOUNTER — Encounter: Payer: Self-pay | Admitting: Family Medicine

## 2018-10-10 ENCOUNTER — Other Ambulatory Visit: Payer: Self-pay

## 2018-10-10 VITALS — BP 102/64 | HR 83 | Temp 97.4°F | Wt 122.8 lb

## 2018-10-10 DIAGNOSIS — J439 Emphysema, unspecified: Secondary | ICD-10-CM | POA: Diagnosis not present

## 2018-10-10 DIAGNOSIS — F17200 Nicotine dependence, unspecified, uncomplicated: Secondary | ICD-10-CM | POA: Diagnosis not present

## 2018-10-10 DIAGNOSIS — E1169 Type 2 diabetes mellitus with other specified complication: Secondary | ICD-10-CM | POA: Diagnosis not present

## 2018-10-10 DIAGNOSIS — J3089 Other allergic rhinitis: Secondary | ICD-10-CM | POA: Diagnosis not present

## 2018-10-10 DIAGNOSIS — J301 Allergic rhinitis due to pollen: Secondary | ICD-10-CM | POA: Insufficient documentation

## 2018-10-10 DIAGNOSIS — Z716 Tobacco abuse counseling: Secondary | ICD-10-CM

## 2018-10-10 DIAGNOSIS — E1100 Type 2 diabetes mellitus with hyperosmolarity without nonketotic hyperglycemic-hyperosmolar coma (NKHHC): Secondary | ICD-10-CM | POA: Diagnosis not present

## 2018-10-10 DIAGNOSIS — E785 Hyperlipidemia, unspecified: Secondary | ICD-10-CM

## 2018-10-10 DIAGNOSIS — R69 Illness, unspecified: Secondary | ICD-10-CM | POA: Diagnosis not present

## 2018-10-10 MED ORDER — FLUTICASONE PROPIONATE 50 MCG/ACT NA SUSP: NASAL | 2 refills | Status: DC

## 2018-10-10 MED ORDER — MONTELUKAST SODIUM 10 MG PO TABS: 10.0000 mg | ORAL_TABLET | Freq: Every day | ORAL | 1 refills | Status: DC
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
## 2018-12-04 ENCOUNTER — Other Ambulatory Visit: Payer: Self-pay | Admitting: Family Medicine

## 2018-12-04 DIAGNOSIS — B0229 Other postherpetic nervous system involvement: Secondary | ICD-10-CM

## 2018-12-17 ENCOUNTER — Other Ambulatory Visit: Payer: Self-pay | Admitting: Family Medicine

## 2018-12-17 DIAGNOSIS — E1100 Type 2 diabetes mellitus with hyperosmolarity without nonketotic hyperglycemic-hyperosmolar coma (NKHHC): Secondary | ICD-10-CM

## 2018-12-29 ENCOUNTER — Other Ambulatory Visit: Payer: Self-pay | Admitting: Family Medicine

## 2018-12-29 DIAGNOSIS — B0229 Other postherpetic nervous system involvement: Secondary | ICD-10-CM

## 2019-01-28 DIAGNOSIS — E119 Type 2 diabetes mellitus without complications: Secondary | ICD-10-CM | POA: Diagnosis not present

## 2019-02-01 ENCOUNTER — Other Ambulatory Visit: Payer: Self-pay | Admitting: Family Medicine

## 2019-02-01 DIAGNOSIS — B0229 Other postherpetic nervous system involvement: Secondary | ICD-10-CM

## 2019-02-05 ENCOUNTER — Other Ambulatory Visit: Payer: Self-pay | Admitting: Family Medicine

## 2019-02-05 DIAGNOSIS — J301 Allergic rhinitis due to pollen: Secondary | ICD-10-CM

## 2019-03-11 ENCOUNTER — Other Ambulatory Visit: Payer: Self-pay

## 2019-03-11 DIAGNOSIS — E1169 Type 2 diabetes mellitus with other specified complication: Secondary | ICD-10-CM

## 2019-03-11 DIAGNOSIS — Z5320 Procedure and treatment not carried out because of patient's decision for unspecified reasons: Secondary | ICD-10-CM

## 2019-03-11 DIAGNOSIS — E785 Hyperlipidemia, unspecified: Secondary | ICD-10-CM

## 2019-03-11 NOTE — Progress Notes (Signed)
Per Dr. Hershal Coria request, referral placed to [facility] to discuss initiation of statin therapy.  Charyl Bigger, CMA

## 2019-03-12 NOTE — Progress Notes (Signed)
This encounter was created in error - please disregard.

## 2019-03-13 ENCOUNTER — Encounter: Payer: Self-pay | Admitting: *Deleted

## 2019-03-13 ENCOUNTER — Other Ambulatory Visit: Payer: Self-pay | Admitting: *Deleted

## 2019-03-13 NOTE — Patient Outreach (Addendum)
[facility]) Care Management  03/13/2019  Autumn Walker 02-29-1951 [id number]   Subjective: Telephone call to patient's home number, spoke with patient, and HIPAA verified.  Discussed [place] Medicare MD referral follow up, patient voiced understanding, and is in agreement to follow up.  Patient states she is doing well, was not aware of referral, and takes medications as prescribed.   Patient states she is aware that her lab values were high in the past but have recently improved,  she is aware that the values need to decrease, aware of medication regimen, has discussed with MD, and voices understanding of needs.  Patient states she is able to manage self care and has assistance as needed.  Patient voices understanding of medical diagnosis and treatment plan.  States she is accessing her [place] benefits as needed via member services number on back of card.  Discussed [facility] Care Management services, patient voiced understanding, and declined referral to [place] for medication management / medication review.  Patient states she does not have any education material, transition of care, care coordination, disease management, disease monitoring, transportation, community resource, or pharmacy needs at this time. States she is very appreciative of the follow up and is in agreement to receive [place] Management information.     Objective: Per KPN (Knowledge Performance Now, point of care tool) and chart review, patient has had no recent hospitalizations or ED visits.   Patient also has a history of diabetes and hyperlipidemia.      Assessment: Received Bernadene Person MD referral on 03/12/2019.  Referral source: Dr. Mellody Dance.   Referral reason: discuss statin initiation therapy, declined statins.  Screening follow up completed and declined [place] Management services at this time.       Plan: RNCM will send patient successful outreach  letter, [facility] pamphlet, and magnet. RNCM will close case due to patient declining [facility] Care Management services. RNCM will send MD case closure letter.        Duvid Smalls H. Annia Friendly, BSN, [place] Management [facility] Telephonic CM Phone: [phone] Fax: [phone]

## 2019-03-22 ENCOUNTER — Other Ambulatory Visit: Payer: Self-pay | Admitting: Family Medicine

## 2019-03-22 DIAGNOSIS — E1100 Type 2 diabetes mellitus with hyperosmolarity without nonketotic hyperglycemic-hyperosmolar coma (NKHHC): Secondary | ICD-10-CM

## 2019-03-22 NOTE — Telephone Encounter (Signed)
Patient was told on 10/10/2018 at her last office visit with me:  Told pt to call for f/up appts:  Return for 12mo f/up- BP, DM, copd/allergies, HLD- NEEDS full set FBW 1 wk prior.

## 2019-03-25 NOTE — Telephone Encounter (Signed)
Pt informed of need for FBW and f/u OV.  Pt expressed understanding, is agreeable, and transferred to front desk to schedule OV.  Charyl Bigger, CMA

## 2019-03-26 ENCOUNTER — Other Ambulatory Visit: Payer: Medicare HMO

## 2019-03-26 ENCOUNTER — Other Ambulatory Visit: Payer: Self-pay

## 2019-03-26 DIAGNOSIS — Z0000 Encounter for general adult medical examination without abnormal findings: Secondary | ICD-10-CM

## 2019-03-26 DIAGNOSIS — E119 Type 2 diabetes mellitus without complications: Secondary | ICD-10-CM

## 2019-03-26 DIAGNOSIS — E785 Hyperlipidemia, unspecified: Secondary | ICD-10-CM

## 2019-03-26 DIAGNOSIS — E1169 Type 2 diabetes mellitus with other specified complication: Secondary | ICD-10-CM

## 2019-03-27 ENCOUNTER — Other Ambulatory Visit: Payer: Medicare HMO

## 2019-03-28 ENCOUNTER — Other Ambulatory Visit: Payer: Self-pay

## 2019-03-28 ENCOUNTER — Ambulatory Visit (INDEPENDENT_AMBULATORY_CARE_PROVIDER_SITE_OTHER): Payer: Medicare HMO | Admitting: Family Medicine

## 2019-03-28 VITALS — BP 95/62 | HR 80 | Temp 98.2°F | Ht 61.0 in | Wt 131.0 lb

## 2019-03-28 DIAGNOSIS — R509 Fever, unspecified: Secondary | ICD-10-CM

## 2019-03-28 DIAGNOSIS — J3089 Other allergic rhinitis: Secondary | ICD-10-CM

## 2019-03-28 DIAGNOSIS — R058 Other specified cough: Secondary | ICD-10-CM

## 2019-03-28 DIAGNOSIS — J439 Emphysema, unspecified: Secondary | ICD-10-CM | POA: Diagnosis not present

## 2019-03-28 DIAGNOSIS — R05 Cough: Secondary | ICD-10-CM

## 2019-03-28 DIAGNOSIS — J0141 Acute recurrent pansinusitis: Secondary | ICD-10-CM

## 2019-03-28 DIAGNOSIS — R69 Illness, unspecified: Secondary | ICD-10-CM | POA: Diagnosis not present

## 2019-03-28 DIAGNOSIS — Z9189 Other specified personal risk factors, not elsewhere classified: Secondary | ICD-10-CM

## 2019-03-28 DIAGNOSIS — F17200 Nicotine dependence, unspecified, uncomplicated: Secondary | ICD-10-CM

## 2019-03-28 MED ORDER — AMOXICILLIN-POT CLAVULANATE 875-125 MG PO TABS: 1.0000 | ORAL_TABLET | Freq: Two times a day (BID) | ORAL | 0 refills | Status: AC

## 2019-03-28 MED ORDER — DEXAMETHASONE 2 MG PO TABS: ORAL_TABLET | ORAL | 0 refills | Status: AC

## 2019-03-28 MED ORDER — FLUCONAZOLE 150 MG PO TABS: ORAL_TABLET | ORAL | 1 refills | Status: DC
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
## 2019-03-29 ENCOUNTER — Other Ambulatory Visit: Payer: Self-pay | Admitting: *Deleted

## 2019-03-29 DIAGNOSIS — R6889 Other general symptoms and signs: Secondary | ICD-10-CM | POA: Diagnosis not present

## 2019-03-29 DIAGNOSIS — Z20822 Contact with and (suspected) exposure to covid-19: Secondary | ICD-10-CM

## 2019-03-30 LAB — NOVEL CORONAVIRUS, NAA: SARS-CoV-2, NAA: NOT DETECTED

## 2019-04-01 ENCOUNTER — Other Ambulatory Visit: Payer: Medicare HMO

## 2019-04-01 ENCOUNTER — Other Ambulatory Visit: Payer: Self-pay

## 2019-04-01 ENCOUNTER — Other Ambulatory Visit: Payer: Self-pay | Admitting: Family Medicine

## 2019-04-01 DIAGNOSIS — E1100 Type 2 diabetes mellitus with hyperosmolarity without nonketotic hyperglycemic-hyperosmolar coma (NKHHC): Secondary | ICD-10-CM

## 2019-04-01 DIAGNOSIS — E1169 Type 2 diabetes mellitus with other specified complication: Secondary | ICD-10-CM

## 2019-04-01 DIAGNOSIS — E785 Hyperlipidemia, unspecified: Secondary | ICD-10-CM | POA: Diagnosis not present

## 2019-04-01 DIAGNOSIS — E559 Vitamin D deficiency, unspecified: Secondary | ICD-10-CM | POA: Diagnosis not present

## 2019-04-01 DIAGNOSIS — Z0000 Encounter for general adult medical examination without abnormal findings: Secondary | ICD-10-CM | POA: Diagnosis not present

## 2019-04-01 DIAGNOSIS — E119 Type 2 diabetes mellitus without complications: Secondary | ICD-10-CM

## 2019-04-02 LAB — COMPREHENSIVE METABOLIC PANEL
ALT: 14 IU/L (ref 0–32)
AST: 16 IU/L (ref 0–40)
Albumin/Globulin Ratio: 1.9 (ref 1.2–2.2)
Albumin: 5 g/dL — ABNORMAL HIGH (ref 3.8–4.8)
Alkaline Phosphatase: 53 IU/L (ref 39–117)
BUN/Creatinine Ratio: 24 (ref 12–28)
BUN: 21 mg/dL (ref 8–27)
Bilirubin Total: 0.2 mg/dL (ref 0.0–1.2)
CO2: 23 mmol/L (ref 20–29)
Calcium: 10.4 mg/dL — ABNORMAL HIGH (ref 8.7–10.3)
Chloride: 101 mmol/L (ref 96–106)
Creatinine, Ser: 0.86 mg/dL (ref 0.57–1.00)
GFR calc Af Amer: 81 mL/min/{1.73_m2} (ref >59)
GFR calc non Af Amer: 70 mL/min/{1.73_m2} (ref >59)
Globulin, Total: 2.6 g/dL (ref 1.5–4.5)
Glucose: 119 mg/dL — ABNORMAL HIGH (ref 65–99)
Potassium: 5.1 mmol/L (ref 3.5–5.2)
Sodium: 141 mmol/L (ref 134–144)
Total Protein: 7.6 g/dL (ref 6.0–8.5)

## 2019-04-02 LAB — VITAMIN D 25 HYDROXY (VIT D DEFICIENCY, FRACTURES): Vit D, 25-Hydroxy: 87.5 ng/mL (ref 30.0–100.0)

## 2019-04-02 LAB — CBC WITH DIFFERENTIAL/PLATELET
Basophils Absolute: 0 10*3/uL (ref 0.0–0.2)
Basos: 0 %
EOS (ABSOLUTE): 0 10*3/uL (ref 0.0–0.4)
Eos: 0 %
Hematocrit: 44.3 % (ref 34.0–46.6)
Hemoglobin: 15.5 g/dL (ref 11.1–15.9)
Immature Grans (Abs): 0.1 10*3/uL (ref 0.0–0.1)
Immature Granulocytes: 1 %
Lymphocytes Absolute: 4.6 10*3/uL — ABNORMAL HIGH (ref 0.7–3.1)
Lymphs: 29 %
MCH: 32.1 pg (ref 26.6–33.0)
MCHC: 35 g/dL (ref 31.5–35.7)
MCV: 92 fL (ref 79–97)
Monocytes Absolute: 1.1 10*3/uL — ABNORMAL HIGH (ref 0.1–0.9)
Monocytes: 7 %
Neutrophils Absolute: 10.1 10*3/uL — ABNORMAL HIGH (ref 1.4–7.0)
Neutrophils: 63 %
Platelets: 399 10*3/uL (ref 150–450)
RBC: 4.83 x10E6/uL (ref 3.77–5.28)
RDW: 12.2 % (ref 11.7–15.4)
WBC: 15.9 10*3/uL — ABNORMAL HIGH (ref 3.4–10.8)

## 2019-04-02 LAB — HEMOGLOBIN A1C
Est. average glucose Bld gHb Est-mCnc: 131 mg/dL
Hgb A1c MFr Bld: 6.2 % — ABNORMAL HIGH (ref 4.8–5.6)

## 2019-04-02 LAB — TSH: TSH: 0.549 u[IU]/mL (ref 0.450–4.500)

## 2019-04-02 LAB — LIPID PANEL
Chol/HDL Ratio: 7.6 ratio — ABNORMAL HIGH (ref 0.0–4.4)
Cholesterol, Total: 298 mg/dL — ABNORMAL HIGH (ref 100–199)
HDL: 39 mg/dL — ABNORMAL LOW (ref >39)
LDL Chol Calc (NIH): 199 mg/dL — ABNORMAL HIGH (ref 0–99)
Triglycerides: 298 mg/dL — ABNORMAL HIGH (ref 0–149)
VLDL Cholesterol Cal: 60 mg/dL — ABNORMAL HIGH (ref 5–40)

## 2019-04-02 LAB — T4, FREE: Free T4: 1.13 ng/dL (ref 0.82–1.77)

## 2019-04-02 LAB — T3: T3, Total: 57 ng/dL — ABNORMAL LOW (ref 71–180)

## 2019-04-10 ENCOUNTER — Encounter: Payer: Self-pay | Admitting: Family Medicine

## 2019-04-10 ENCOUNTER — Other Ambulatory Visit: Payer: Self-pay

## 2019-04-10 ENCOUNTER — Ambulatory Visit (INDEPENDENT_AMBULATORY_CARE_PROVIDER_SITE_OTHER): Payer: Medicare HMO | Admitting: Family Medicine

## 2019-04-10 VITALS — BP 101/63 | HR 103 | Temp 98.1°F | Wt 133.0 lb

## 2019-04-10 DIAGNOSIS — Z716 Tobacco abuse counseling: Secondary | ICD-10-CM

## 2019-04-10 DIAGNOSIS — E785 Hyperlipidemia, unspecified: Secondary | ICD-10-CM

## 2019-04-10 DIAGNOSIS — J439 Emphysema, unspecified: Secondary | ICD-10-CM

## 2019-04-10 DIAGNOSIS — E782 Mixed hyperlipidemia: Secondary | ICD-10-CM

## 2019-04-10 DIAGNOSIS — E1169 Type 2 diabetes mellitus with other specified complication: Secondary | ICD-10-CM | POA: Diagnosis not present

## 2019-04-10 DIAGNOSIS — R69 Illness, unspecified: Secondary | ICD-10-CM | POA: Diagnosis not present

## 2019-04-10 DIAGNOSIS — Z8349 Family history of other endocrine, nutritional and metabolic diseases: Secondary | ICD-10-CM | POA: Diagnosis not present

## 2019-04-10 DIAGNOSIS — R7989 Other specified abnormal findings of blood chemistry: Secondary | ICD-10-CM

## 2019-04-10 DIAGNOSIS — J3089 Other allergic rhinitis: Secondary | ICD-10-CM | POA: Diagnosis not present

## 2019-04-10 DIAGNOSIS — F17200 Nicotine dependence, unspecified, uncomplicated: Secondary | ICD-10-CM

## 2019-04-10 DIAGNOSIS — Z91199 Patient's noncompliance with other medical treatment and regimen due to unspecified reason: Secondary | ICD-10-CM

## 2019-04-10 DIAGNOSIS — E1100 Type 2 diabetes mellitus with hyperosmolarity without nonketotic hyperglycemic-hyperosmolar coma (NKHHC): Secondary | ICD-10-CM

## 2019-04-10 DIAGNOSIS — Z9119 Patient's noncompliance with other medical treatment and regimen: Secondary | ICD-10-CM | POA: Diagnosis not present

## 2019-04-10 DIAGNOSIS — J301 Allergic rhinitis due to pollen: Secondary | ICD-10-CM

## 2019-04-10 MED ORDER — METFORMIN HCL 500 MG PO TABS: 1000.0000 mg | ORAL_TABLET | Freq: Two times a day (BID) | ORAL | 1 refills | Status: DC

## 2019-04-10 NOTE — Patient Instructions (Addendum)
Please resume taking Singulair daily, along with Flonase one spray each nostril, twice a day, after sinus rinses, along with Claritin every day.  Please make sure you take your cholesterol meds, diabetes medicines and all other medicines as written on your list of medications.  If you are taking anything differently please call our office and update [place] so we know for sure what you are and are not taking.    Diabetes Mellitus and Standards of Medical Care  Managing diabetes (diabetes mellitus) can be complicated. Your diabetes treatment may be managed by a team of health care providers, including:  A diet and nutrition specialist (registered dietitian).  A nurse.  A certified diabetes educator (CDE).  A diabetes specialist (endocrinologist).  An eye doctor.  A primary care provider.  A dentist.  Your health care providers follow a schedule in order to help you get the best quality of care. The following schedule is a general guideline for your diabetes management plan. Your health care providers may also give you more specific instructions.  HbA1c (hemoglobin A1c) test This test provides information about blood sugar (glucose) control over the previous 2-3 months. It is used to check whether your diabetes management plan needs to be adjusted.  If you are meeting your treatment goals, this test is done at least 2 times a year.  If you are not meeting treatment goals or if your treatment goals have changed, this test is done 4 times a year.  Blood pressure test  This test is done at every routine medical visit. For most people, the goal is less than 130/80. Ask your health care provider what your goal blood pressure should be.  Dental and eye exams  Visit your dentist two times a year.  If you have type 1 diabetes, get an eye exam 3-5 years after you are diagnosed, and then once a year after your first exam. ? If you were diagnosed with type 1 diabetes as a child, get an eye  exam when you are age 64 or older and have had diabetes for 3-5 years. After the first exam, you should get an eye exam once a year.  If you have type 2 diabetes, have an eye exam as soon as you are diagnosed, and then once a year after your first exam.  Foot care exam  Visual foot exams are done at every routine medical visit. The exams check for cuts, bruises, redness, blisters, sores, or other problems with the feet.  A complete foot exam is done by your health care provider once a year. This exam includes an inspection of the structure and skin of your feet, and a check of the pulses and sensation in your feet. ? Type 1 diabetes: Get your first exam 3-5 years after diagnosis. ? Type 2 diabetes: Get your first exam as soon as you are diagnosed.  Check your feet every day for cuts, bruises, redness, blisters, or sores. If you have any of these or other problems that are not healing, contact your health care provider.  Kidney function test (urine microalbumin)  This test is done once a year. ? Type 1 diabetes: Get your first test 5 years after diagnosis. ? Type 2 diabetes: Get your first test as soon as you are diagnosed._  If you have chronic kidney disease (CKD), get a serum creatinine and estimated glomerular filtration rate (eGFR) test once a year.  Lipid profile (cholesterol, HDL, LDL, triglycerides)  This test should be done when you  are diagnosed with diabetes, and every 5 years after the first test. If you are on medicines to lower your cholesterol, you may need to get this test done every year. ? The goal for LDL is less than 100 mg/dL (5.5 mmol/L). If you are at high risk, the goal is less than 70 mg/dL (3.9 mmol/L). ? The goal for HDL is 40 mg/dL (2.2 mmol/L) for men and 50 mg/dL(2.8 mmol/L) for women. An HDL cholesterol of 60 mg/dL (3.3 mmol/L) or higher gives some protection against heart disease. ? The goal for triglycerides is less than 150 mg/dL (8.3  mmol/L).  Immunizations  The yearly flu (influenza) vaccine is recommended for everyone 6 months or older who has diabetes.  The pneumonia (pneumococcal) vaccine is recommended for everyone 2 years or older who has diabetes. If you are 37 or older, you may get the pneumonia vaccine as a series of two separate shots.  The hepatitis B vaccine is recommended for adults shortly after they have been diagnosed with diabetes.  The Tdap (tetanus, diphtheria, and pertussis) vaccine should be given: ? According to normal childhood vaccination schedules, for children. ? Every 10 years, for adults who have diabetes.  The shingles vaccine is recommended for people who have had chicken pox and are 50 years or older.  Mental and emotional health  Screening for symptoms of eating disorders, anxiety, and depression is recommended at the time of diagnosis and afterward as needed. If your screening shows that you have symptoms (you have a positive screening result), you may need further evaluation and be referred to a mental health care provider.  Diabetes self-management education  Education about how to manage your diabetes is recommended at diagnosis and ongoing as needed.  Treatment plan  Your treatment plan will be reviewed at every medical visit.  Summary  Managing diabetes (diabetes mellitus) can be complicated. Your diabetes treatment may be managed by a team of health care providers.  Your health care providers follow a schedule in order to help you get the best quality of care.  Standards of care including having regular physical exams, blood tests, blood pressure monitoring, immunizations, screening tests, and education about how to manage your diabetes.  Your health care providers may also give you more specific instructions based on your individual health.      Type 2 Diabetes Mellitus, Self Care, Adult Caring for yourself after you have been diagnosed with type 2 diabetes (type  2 diabetes mellitus) means keeping your blood sugar (glucose) under control with a balance of:  Nutrition.  Exercise.  Lifestyle changes.  Medicines or insulin, if necessary.  Support from your team of health care providers and others.  The following information explains what you need to know to manage your diabetes at home. What do I need to do to manage my blood glucose?  Check your blood glucose every day, as often as told by your health care provider.  Contact your health care provider if your blood glucose is above your target for 2 tests in a row.  Have your A1c (hemoglobin A1c) level checked at least two times a year, or as often as told by your health care provider. Your health care provider will set individualized treatment goals for you. Generally, the goal of treatment is to maintain the following blood glucose levels:  Before meals (preprandial): 80-130 mg/dL (4.4-7.2 mmol/L).  After meals (postprandial): below 180 mg/dL (10 mmol/L).  A1c level: less than 7%.  What do I need  to know about hyperglycemia and hypoglycemia? What is hyperglycemia? Hyperglycemia, also called high blood glucose, occurs when blood glucose is too high.Make sure you know the early signs of hyperglycemia, such as:  Increased thirst.  Hunger.  Feeling very tired.  Needing to urinate more often than usual.  Blurry vision.  What is hypoglycemia? Hypoglycemia, also called low blood glucose, occurswith a blood glucose level at or below 70 mg/dL (3.9 mmol/L). The risk for hypoglycemia increases during or after exercise, during sleep, during illness, and when skipping meals or not eating for a long time (fasting). It is important to know the symptoms of hypoglycemia and treat it right away. Always have a 15-gram rapid-acting carbohydrate snack with you to treat low blood glucose. Family members and close friends should also know the symptoms and should understand how to treat hypoglycemia, in  case you are not able to treat yourself. What are the symptoms of hypoglycemia? Hypoglycemia symptoms can include:  Hunger.  Anxiety.  Sweating and feeling clammy.  Confusion.  Dizziness or feeling light-headed.  Sleepiness.  Nausea.  Increased heart rate.  Headache.  Blurry vision.  Seizure.  Nightmares.  Tingling or numbness around the mouth, lips, or tongue.  A change in speech.  Decreased ability to concentrate.  A change in coordination.  Restless sleep.  Tremors or shakes.  Fainting.  Irritability.  How do I treat hypoglycemia?  If you are alert and able to swallow safely, follow the 15:15 rule:  Take 15 grams of a rapid-acting carbohydrate. Rapid-acting options include: ? 1 tube of glucose gel. ? 3 glucose pills. ? 6-8 pieces of hard candy. ? 4 oz (120 mL) of fruit juice. ? 4 oz (120 mL) of regular (not diet) soda.  Check your blood glucose 15 minutes after you take the carbohydrate.  If the repeat blood glucose level is still at or below 70 mg/dL (3.9 mmol/L), take 15 grams of a carbohydrate again.  If your blood glucose level does not increase above 70 mg/dL (3.9 mmol/L) after 3 tries, seek emergency medical care.  After your blood glucose level returns to normal, eat a meal or a snack within 1 hour.  How do I treat severe hypoglycemia? Severe hypoglycemia is when your blood glucose level is at or below 54 mg/dL (3 mmol/L). Severe hypoglycemia is an emergency. Do not wait to see if the symptoms will go away. Get medical help right away. Call your local emergency services (911 in the [place] Do not drive yourself to the hospital. If you have severe hypoglycemia and you cannot eat or drink, you may need an injection of glucagon. A family member or close friend should learn how to check your blood glucose and how to give you a glucagon injection. Ask your health care provider if you need to have an emergency glucagon injection kit  available. Severe hypoglycemia may need to be treated in a hospital. The treatment may include getting glucose through an IV tube. You may also need treatment for the cause of your hypoglycemia. Can having diabetes put me at risk for other conditions? Having diabetes can put you at risk for other long-term (chronic) conditions, such as heart disease and kidney disease. Your health care provider may prescribe medicines to help prevent complications from diabetes. These medicines may include:  Aspirin.  Medicine to lower cholesterol.  Medicine to control blood pressure.  What else can I do to manage my diabetes? Take your diabetes medicines as told  If your health care  provider prescribed insulin or diabetes medicines, take them every day.  Do not run out of insulin or other diabetes medicines that you take. Plan ahead so you always have these available.  If you use insulin, adjust your dosage based on how physically active you are and what foods you eat. Your health care provider will tell you how to adjust your dosage. Make healthy food choices  The things that you eat and drink affect your blood glucose and your insulin dosage. Making good choices helps to control your diabetes and prevent other health problems. A healthy meal plan includes eating lean proteins, complex carbohydrates, fresh fruits and vegetables, low-fat dairy products, and healthy fats. Make an appointment to see a diet and nutrition specialist (registered dietitian) to help you create an eating plan that is right for you. Make sure that you:  Follow instructions from your health care provider about eating or drinking restrictions.  Drink enough fluid to keep your urine clear or pale yellow.  Eat healthy snacks between nutritious meals.  Track the carbohydrates that you eat. Do this by reading food labels and learning the standard serving sizes of foods.  Follow your sick day plan whenever you cannot eat or drink as  usual. Make this plan in advance with your health care provider.  Stay active  Exercise regularly, as told by your health care provider. This may include:  Stretching and doing strength exercises, such as yoga or weightlifting, at least 2 times a week.  Doing at least 150 minutes of moderate-intensity or vigorous-intensity exercise each week. This could be brisk walking, biking, or water aerobics. ? Spread out your activity over at least 3 days of the week. ? Do not go more than 2 days in a row without doing some kind of physical activity.  When you start a new exercise or activity, work with your health care provider to adjust your insulin, medicines, or food intake as needed. Make healthy lifestyle choices  Do not use any tobacco products, such as cigarettes, chewing tobacco, and e-cigarettes. If you need help quitting, ask your health care provider.  If your health care provider says that alcohol is safe for you, limit alcohol intake to no more than 1 drink per day for nonpregnant women and 2 drinks per day for men. One drink equals 12 oz of beer, 5 oz of wine, or 1 oz of hard liquor.  Learn to manage stress. If you need help with this, ask your health care provider. Care for your body   Keep your immunizations up to date. In addition to getting vaccinations as told by your health care provider, it is recommended that you get vaccinated against the following illnesses: ? The flu (influenza). Get a flu shot every year. ? Pneumonia. ? Hepatitis B.  Schedule an eye exam soon after your diagnosis, and then one time every year after that.  Check your skin and feet every day for cuts, bruises, redness, blisters, or sores. Schedule a foot exam with your health care provider once every year.  Brush your teeth and gums two times a day, and floss at least one time a day. Visit your dentist at least once every 6 months.  Maintain a healthy weight. General instructions  Take  over-the-counter and prescription medicines only as told by your health care provider.  Share your diabetes management plan with people in your workplace, school, and household.  Check your urine for ketones when you are ill and as told by  your health care provider.  Ask your health care provider: ? Do I need to meet with a diabetes educator? ? Where can I find a support group for people with diabetes?  Carry a medical alert card or wear medical alert jewelry.  Keep all follow-up visits as told by your health care provider. This is important. Where to find more information: For more information about diabetes, visit:  American Diabetes Association (ADA): www.diabetes.org  American Association of Diabetes Educators (AADE): www.diabeteseducator.org/patient-resources  This information is not intended to replace advice given to you by your health care provider. Make sure you discuss any questions you have with your health care provider. Document Released: 10/19/2015 Document Revised: 12/03/2015 Document Reviewed: 07/31/2015 Elsevier Interactive Patient Education  2017 [place].      Blood Glucose Monitoring, Adult Monitoring your blood sugar (glucose) helps you manage your diabetes. It also helps you and your health care provider determine how well your diabetes management plan is working. Blood glucose monitoring involves checking your blood glucose as often as directed, and keeping a record (log) of your results over time. Why should I monitor my blood glucose? Checking your blood glucose regularly can:  Help you understand how food, exercise, illnesses, and medicines affect your blood glucose.  Let you know what your blood glucose is at any time. You can quickly tell if you are having low blood glucose (hypoglycemia) or high blood glucose (hyperglycemia).  Help you and your health care provider adjust your medicines as needed.  When should I check my blood glucose? Follow  instructions from your health care provider about how often to check your blood glucose.   This may depend on:  The type of diabetes you have.  How well-controlled your diabetes is.  Medicines you are taking.  If you have type 1 diabetes:  Check your blood glucose at least 2 times a day.  Also check your blood glucose: ? Before every insulin injection. ? Before and after exercise. ? Between meals. ? 2 hours after a meal. ? Occasionally between 2:00 a.m. and 3:00 a.m., as directed. ? Before potentially dangerous tasks, like driving or using heavy machinery. ? At bedtime.  You may need to check your blood glucose more often, up to 6-10 times a day: ? If you use an insulin pump. ? If you need multiple daily injections (MDI). ? If your diabetes is not well-controlled. ? If you are ill. ? If you have a history of severe hypoglycemia. ? If you have a history of not knowing when your blood glucose is getting low (hypoglycemia unawareness).  If you have type 2 diabetes:  If you take insulin or other diabetes medicines, check your blood glucose at least 2 times a day.  If you are on intensive insulin therapy, check your blood glucose at least 4 times a day. Occasionally, you may also need to check between 2:00 a.m. and 3:00 a.m., as directed.  Also check your blood glucose: ? Before and after exercise. ? Before potentially dangerous tasks, like driving or using heavy machinery.  You may need to check your blood glucose more often if: ? Your medicine is being adjusted. ? Your diabetes is not well-controlled. ? You are ill.  What is a blood glucose log?  A blood glucose log is a record of your blood glucose readings. It helps you and your health care provider: ? Look for patterns in your blood glucose over time. ? Adjust your diabetes management plan as needed.  Every time you check your blood glucose, write down your result and notes about things that may be affecting your  blood glucose, such as your diet and exercise for the day.  Most glucose meters store a record of glucose readings in the meter. Some meters allow you to download your records to a computer. How do I check my blood glucose? Follow these steps to get accurate readings of your blood glucose: Supplies needed   Blood glucose meter.  Test strips for your meter. Each meter has its own strips. You must use the strips that come with your meter.  A needle to prick your finger (lancet). Do not use lancets more than once.  A device that holds the lancet (lancing device).  A journal or log book to write down your results.  Procedure  Wash your hands with soap and water.  Prick the side of your finger (not the tip) with the lancet. Use a different finger each time.  Gently rub the finger until a small drop of blood appears.  Follow instructions that come with your meter for inserting the test strip, applying blood to the strip, and using your blood glucose meter.  Write down your result and any notes.  Alternative testing sites  Some meters allow you to use areas of your body other than your finger (alternative sites) to test your blood.  If you think you may have hypoglycemia, or if you have hypoglycemia unawareness, do not use alternative sites. Use your finger instead.  Alternative sites may not be as accurate as the fingers, because blood flow is slower in these areas. This means that the result you get may be delayed, and it may be different from the result that you would get from your finger.  The most common alternative sites are: ? Forearm. ? Thigh. ? Palm of the hand.  Additional tips  Always keep your supplies with you.  If you have questions or need help, all blood glucose meters have a 24-hour hotline number that you can call. You may also contact your health care provider.  After you use a few boxes of test strips, adjust (calibrate) your blood glucose meter by  following instructions that came with your meter.    The American Diabetes Association suggests the following targets for most nonpregnant adults with diabetes.  More or less stringent glycemic goals may be appropriate for each individual.  A1C: Less than 7% A1C may also be reported as eAG: Less than 154 mg/dl Before a meal (preprandial plasma glucose): 80-130 mg/dl 1-2 hours after beginning of the meal (Postprandial plasma glucose)*: Less than 180 mg/dl  *Postprandial glucose may be targeted if A1C goals are not met despite reaching preprandial glucose goals.   GOALS in short:  The goals are for the Hgb A1C to be less than 7.0 & blood pressure to be less than 130/80.    It is recommended that all diabetics are educated on and follow a healthy diabetic diet, exercise for 30 minutes 3-4 times per week (walking, biking, swimming, or machine), monitor blood glucose readings and bring that record with you to be reviewed at your next office visit.     You should be checking fasting blood sugars- especially after you eat poorly or eat really healthy, and also check 2 hour postprandial blood sugars after largest meal of the day.    Write these down and bring in your log at each office visit.    You will need to be  seen every 3 months by the provider managing your Diabetes unless told otherwise by that provider.   You will need yearly eye exams from an eye specialist and foot exams to check the nerves of your feet.  Also, your urine should be checked yearly as well to make sure excess protein is not present.   If you are checking your blood pressure at home, please record it and bring it to your next office visit.    Follow the Dietary Approaches to Stop Hypertension (DASH) diet (3 servings of fruit and vegetables daily, whole grains, low sodium, low-fat proteins).  See below.    Lastly, when it comes to your cholesterol, the goal is to have the HDL (good cholesterol) >40, and the LDL (bad  cholesterol) <100.   It is recommended that you follow a heart healthy, low saturated and trans-fat diet and exercise for 30 minutes at least 5 times a week.     (( Check out the DASH diet = 1.5 Gram Low Sodium Diet   A 1.5 gram sodium diet restricts the amount of sodium in the diet to no more than 1.5 g or 1500 mg daily.  The American Heart Association recommends Americans over the age of 1 to consume no more than 1500 mg of sodium each day to reduce the risk of developing high blood pressure.  Research also shows that limiting sodium may reduce heart attack and stroke risk.  Many foods contain sodium for flavor and sometimes as a preservative.  When the amount of sodium in a diet needs to be low, it is important to know what to look for when choosing foods and drinks.  The following includes some information and guidelines to help make it easier for you to adapt to a low sodium diet.    QUICK TIPS  Do not add salt to food.  Avoid convenience items and fast food.  Choose unsalted snack foods.  Buy lower sodium products, often labeled as "lower sodium" or "no salt added."  Check food labels to learn how much sodium is in 1 serving.  When eating at a restaurant, ask that your food be prepared with less salt or none, if possible.    READING FOOD LABELS FOR SODIUM INFORMATION  The nutrition facts label is a good place to find how much sodium is in foods. Look for products with no more than 400 mg of sodium per serving.  Remember that 1.5 g = 1500 mg.  The food label may also list foods as:  Sodium-free: Less than 5 mg in a serving.  Very low sodium: 35 mg or less in a serving.  Low-sodium: 140 mg or less in a serving.  Light in sodium: 50% less sodium in a serving. For example, if a food that usually has 300 mg of sodium is changed to become light in sodium, it will have 150 mg of sodium.  Reduced sodium: 25% less sodium in a serving. For example, if a food that usually has 400 mg of sodium  is changed to reduced sodium, it will have 300 mg of sodium.    CHOOSING FOODS  Grains  Avoid: Salted crackers and snack items. Some cereals, including instant hot cereals. Bread stuffing and biscuit mixes. Seasoned rice or pasta mixes.  Choose: Unsalted snack items. Low-sodium cereals, oats, puffed wheat and rice, shredded wheat. English muffins and bread. Pasta.  Meats  Avoid: Salted, canned, smoked, spiced, pickled meats, including fish and poultry. Bacon, ham, sausage, cold  cuts, hot dogs, anchovies.  Choose: Low-sodium canned tuna and salmon. Fresh or frozen meat, poultry, and fish.  Dairy  Avoid: Processed cheese and spreads. Cottage cheese. Buttermilk and condensed milk. Regular cheese.  Choose: Milk. Low-sodium cottage cheese. Yogurt. Sour cream. Low-sodium cheese.  Fruits and Vegetables  Avoid: Regular canned vegetables. Regular canned tomato sauce and paste. Frozen vegetables in sauces. Olives. Angie Fava. Relishes. Sauerkraut.  Choose: Low-sodium canned vegetables. Low-sodium tomato sauce and paste. Frozen or fresh vegetables. Fresh and frozen fruit.  Condiments  Avoid: Canned and packaged gravies. Worcestershire sauce. Tartar sauce. Barbecue sauce. Soy sauce. Steak sauce. Ketchup. Onion, garlic, and table salt. Meat flavorings and tenderizers.  Choose: Fresh and dried herbs and spices. Low-sodium varieties of mustard and ketchup. Lemon juice. Tabasco sauce. Horseradish.    SAMPLE 1.5 GRAM SODIUM MEAL PLAN:   Breakfast / Sodium (mg)  1 cup low-fat milk / 143 mg  1 whole-wheat English muffin / 240 mg  1 tbs heart-healthy margarine / 153 mg  1 hard-boiled egg / 139 mg  1 small orange / 0 mg  Lunch / Sodium (mg)  1 cup raw carrots / 76 mg  2 tbs no salt added peanut butter / 5 mg  2 slices whole-wheat bread / 270 mg  1 tbs jelly / 6 mg   cup red grapes / 2 mg  Dinner / Sodium (mg)  1 cup whole-wheat pasta / 2 mg  1 cup low-sodium tomato sauce / 73 mg  3 oz lean ground beef  / 57 mg  1 small side salad (1 cup raw spinach leaves,  cup cucumber,  cup yellow bell pepper) with 1 tsp olive oil and 1 tsp red wine vinegar / 25 mg  Snack / Sodium (mg)  1 container low-fat vanilla yogurt / 107 mg  3 graham cracker squares / 127 mg  Nutrient Analysis  Calories: 1745  Protein: 75 g  Carbohydrate: 237 g  Fat: 57 g  Sodium: 1425 mg  Document Released: 06/27/2005 Document Revised: 03/09/2011 Document Reviewed: 09/28/2009  ExitCare Patient Information 2012 [place].))    This information is not intended to replace advice given to you by your health care provider. Make sure you discuss any questions you have with your health care provider. Document Released: 06/30/2003 Document Revised: 01/15/2016 Document Reviewed: 12/07/2015 Elsevier Interactive Patient Education  2017 [employer].
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
## 2019-04-16 ENCOUNTER — Encounter: Payer: Self-pay | Admitting: Internal Medicine

## 2019-04-16 ENCOUNTER — Other Ambulatory Visit: Payer: Self-pay | Admitting: Family Medicine

## 2019-04-16 DIAGNOSIS — E1100 Type 2 diabetes mellitus with hyperosmolarity without nonketotic hyperglycemic-hyperosmolar coma (NKHHC): Secondary | ICD-10-CM

## 2019-04-17 ENCOUNTER — Telehealth: Payer: Self-pay | Admitting: Family Medicine

## 2019-04-17 ENCOUNTER — Other Ambulatory Visit: Payer: Self-pay | Admitting: Family Medicine

## 2019-04-17 DIAGNOSIS — E1100 Type 2 diabetes mellitus with hyperosmolarity without nonketotic hyperglycemic-hyperosmolar coma (NKHHC): Secondary | ICD-10-CM

## 2019-04-17 DIAGNOSIS — E782 Mixed hyperlipidemia: Secondary | ICD-10-CM

## 2019-04-17 DIAGNOSIS — J3089 Other allergic rhinitis: Secondary | ICD-10-CM

## 2019-04-17 MED ORDER — EZETIMIBE 10 MG PO TABS: 10.0000 mg | ORAL_TABLET | Freq: Every day | ORAL | 3 refills | Status: DC

## 2019-04-17 NOTE — Telephone Encounter (Signed)
Zetia refill sent to pharmacy.

## 2019-04-17 NOTE — Telephone Encounter (Signed)
Patient called, stating that she tried getting her mediation refilled: ezetimibe (ZETIA) 10 MG tablet [id number]  and pharmacy would not refill it. She thought she was supposed to continue taking it, please Advise.  -- forwarding to CMA -- AM

## 2019-04-23 ENCOUNTER — Encounter: Payer: Self-pay | Admitting: Family Medicine

## 2019-04-24 ENCOUNTER — Other Ambulatory Visit: Payer: Self-pay

## 2019-04-24 ENCOUNTER — Encounter: Payer: Self-pay | Admitting: Family Medicine

## 2019-04-24 DIAGNOSIS — E782 Mixed hyperlipidemia: Secondary | ICD-10-CM

## 2019-04-24 MED ORDER — EZETIMIBE 10 MG PO TABS: 10.0000 mg | ORAL_TABLET | Freq: Every day | ORAL | 3 refills | Status: DC

## 2019-04-24 MED ORDER — METFORMIN HCL 1000 MG PO TABS: 1000.0000 mg | ORAL_TABLET | Freq: Two times a day (BID) | ORAL | 3 refills | Status: DC

## 2019-04-24 NOTE — Progress Notes (Signed)
Received MyChart message that pt's metformin was changed at last visit to 1000mg  BID.  New RX sent to pharmacy.  Charyl Bigger, CMA

## 2019-04-30 DIAGNOSIS — E559 Vitamin D deficiency, unspecified: Secondary | ICD-10-CM | POA: Insufficient documentation

## 2019-05-02 ENCOUNTER — Telehealth: Payer: Self-pay | Admitting: Family Medicine

## 2019-05-02 DIAGNOSIS — E782 Mixed hyperlipidemia: Secondary | ICD-10-CM

## 2019-05-02 MED ORDER — EZETIMIBE 10 MG PO TABS: 10.0000 mg | ORAL_TABLET | Freq: Every day | ORAL | 3 refills | Status: DC

## 2019-05-02 NOTE — Telephone Encounter (Signed)
Resent and if the pharmacy has trouble receiving this one please ask that they take the prescription verbally over the phone to not delay the patients ability to receive the medication any longer then need be

## 2019-05-02 NOTE — Telephone Encounter (Signed)
Forwarding message to medical asst to resubmit Rx for :  Pharmacy states did not     ezetimibe (ZETIA) 10 MG tablet [id number]   Order Details Dose: 10 mg Route: Oral Frequency: Daily after supper  Dispense Quantity: 90 tablet Refills: 3 Fills remaining: --        Sig: Take 1 tablet (10 mg total) by mouth daily after supper.     -- Please send again to :   [facility], [place] (Phone) [phone] (Fax)   --Dion Body

## 2019-05-03 ENCOUNTER — Other Ambulatory Visit: Payer: Self-pay

## 2019-05-03 ENCOUNTER — Telehealth: Payer: Self-pay | Admitting: Family Medicine

## 2019-05-03 DIAGNOSIS — E782 Mixed hyperlipidemia: Secondary | ICD-10-CM

## 2019-05-03 MED ORDER — EZETIMIBE 10 MG PO TABS: 10.0000 mg | ORAL_TABLET | Freq: Every day | ORAL | 3 refills | Status: DC

## 2019-05-03 NOTE — Telephone Encounter (Signed)
[facility] called asking about refill order for patient's ezetimibe. I advised the pharm tech that it looks like the order was sent yesterday but upon reviewing pharm's address it seems the order was sent to the wrong pharm (it was sent to [facility] out of [place] and not to the correct pharm which is [facility] out of [place]). Can we please reorder this med and send it to the correct pharm? I have updated the correct pharm in the patient's chart.

## 2019-05-03 NOTE — Telephone Encounter (Signed)
Completed.

## 2019-05-11 ENCOUNTER — Other Ambulatory Visit: Payer: Self-pay | Admitting: Family Medicine

## 2019-05-11 DIAGNOSIS — R69 Illness, unspecified: Secondary | ICD-10-CM | POA: Diagnosis not present

## 2019-05-11 DIAGNOSIS — B0229 Other postherpetic nervous system involvement: Secondary | ICD-10-CM

## 2019-05-13 ENCOUNTER — Encounter: Payer: Self-pay | Admitting: Family Medicine

## 2019-05-16 ENCOUNTER — Other Ambulatory Visit: Payer: Self-pay

## 2019-05-16 ENCOUNTER — Ambulatory Visit (INDEPENDENT_AMBULATORY_CARE_PROVIDER_SITE_OTHER): Payer: Medicare HMO | Admitting: Internal Medicine

## 2019-05-16 ENCOUNTER — Encounter: Payer: Self-pay | Admitting: Internal Medicine

## 2019-05-16 VITALS — BP 114/70 | HR 106 | Temp 98.4°F | Ht 61.0 in | Wt 136.2 lb

## 2019-05-16 DIAGNOSIS — R7989 Other specified abnormal findings of blood chemistry: Secondary | ICD-10-CM | POA: Diagnosis not present

## 2019-05-16 LAB — T3, FREE: T3, Free: 3.1 pg/mL (ref 2.3–4.2)

## 2019-05-16 LAB — TSH: TSH: 0.8 u[IU]/mL (ref 0.35–4.50)

## 2019-05-16 LAB — T4, FREE: Free T4: 0.67 ng/dL (ref 0.60–1.60)

## 2019-05-16 NOTE — Progress Notes (Signed)
Name: Autumn Walker  MRN/ DOB: [id number], Jan 01, 1951    Age/ Sex: 68 y.o., female    PCP: Autumn Lot, DO   Reason for Endocrinology Evaluation: Low T3      Date of Initial Endocrinology Evaluation: 05/17/2019     HPI: Autumn Walker is a 68 y.o. female with a past medical history of T2DM and Dyslipidemia. The patient presented for initial endocrinology clinic visit on 05/17/2019 for consultative assistance with her low T3.      Pt was noted to have a low T3 levels on routine labs in 03/2019. Autumn Walker was recovering from a sinus infection at the time.     Pt with multiple complaints today to include a 15 lbs weight gain, hair loss , insomnia for the past 2 months , as well as lack of energy .   Has strong hx of thyroid issues      HISTORY:  Past Medical History:  Past Medical History:  Diagnosis Date  . Allergy   . Blood transfusion without reported diagnosis   . Cataract   . Diabetes mellitus without complication [facility])    Past Surgical History:  Past Surgical History:  Procedure Laterality Date  . APPENDECTOMY    . CHOLECYSTECTOMY    . PANCREATECTOMY    . SPLENECTOMY, TOTAL        Social History:  reports that Autumn Walker has been smoking cigarettes. Autumn Walker has been smoking about 0.00 packs per day for the past 40.00 years. Autumn Walker has never used smokeless tobacco. Autumn Walker reports that Autumn Walker does not drink alcohol or use drugs.  Family History: family history includes Alcohol abuse in her brother and father; Cancer in her paternal grandfather; Depression in her sister; Diabetes in her maternal grandmother; Heart attack in her father; Heart disease in her father and maternal grandfather; Hyperlipidemia in her father; Hypertension in her paternal grandfather; Thyroid disease in her daughter, mother, and sister.   HOME MEDICATIONS: Allergies as of 05/16/2019      Reactions   Atorvastatin Other (See Comments)   Leg cramps   Codeine Nausea Only      Medication List        Accurate as of May 16, 2019 11:59 PM. If you have any questions, ask your nurse or doctor.        STOP taking these medications   fluconazole 150 MG tablet Commonly known as: DIFLUCAN Stopped by: Autumn Shorts, MD   Vitamin D3 250 MCG (10000 UT) Tabs Stopped by: Autumn Shorts, MD     TAKE these medications   albuterol 108 (90 Base) MCG/ACT inhaler Commonly known as: VENTOLIN HFA Inhale 1-2 puffs into the lungs every 4 (four) hours as needed for wheezing or shortness of breath.   ezetimibe 10 MG tablet Commonly known as: Zetia Take 1 tablet (10 mg total) by mouth daily after supper.   fluticasone 50 MCG/ACT nasal spray Commonly known as: FLONASE USE 1 SPRAY IN EACH NOSTRIL TWICE DAILY FOLLOWING SINUS RINSES   gabapentin 300 MG capsule Commonly known as: NEURONTIN 1 TABLET EVERY MORNING, 1 TABLET AFTERNOON AND 2 TABLETS DAILY AT BEDTIME   ibuprofen 200 MG tablet Commonly known as: ADVIL Take 600 mg by mouth every 6 (six) hours as needed.   loratadine 10 MG tablet Commonly known as: CLARITIN Take 10 mg by mouth daily.   losartan 25 MG tablet Commonly known as: COZAAR TAKE 1 TABLET BY MOUTH EVERY DAY   metFORMIN 1000 MG tablet  Commonly known as: GLUCOPHAGE Take 1 tablet (1,000 mg total) by mouth 2 (two) times daily with a meal.   montelukast 10 MG tablet Commonly known as: SINGULAIR TAKE 1 TABLET BY MOUTH EVERYDAY AT BEDTIME   omega-3 acid ethyl esters 1 g capsule Commonly known as: LOVAZA Take 2 capsules (2 g total) by mouth 2 (two) times daily.   Pen Needles 31G X 6 MM Misc 1 each by Does not apply route daily.   rosuvastatin 40 MG tablet Commonly known as: CRESTOR TAKE 1 TABLET BY MOUTH EVERY DAY   Symbicort 160-4.5 MCG/ACT inhaler Generic drug: budesonide-formoterol INHALE 2 PUFFS INTO THE LUNGS 2 (TWO) TIMES DAILY.         REVIEW OF SYSTEMS: A comprehensive ROS was conducted with the patient and is negative except as per  HPI and below:  Review of Systems  Constitutional: Positive for malaise/fatigue. Negative for weight loss.  HENT: Negative for congestion and sore throat.   Respiratory: Negative for cough and shortness of breath.   Cardiovascular: Negative for chest pain and palpitations.  Gastrointestinal: Negative for nausea and vomiting.  Psychiatric/Behavioral: Negative for depression. The patient is not nervous/anxious.        OBJECTIVE:  VS: BP 114/70 (BP Location: Left Arm, Patient Position: Sitting, Cuff Size: Normal)   Pulse (!) 106   Temp 98.4 F (36.9 C) (Oral)   Ht 5\' 1"  (1.549 m)   Wt 136 lb 3.2 oz (61.8 kg)   SpO2 96%   BMI 25.73 kg/m    Wt Readings from Last 3 Encounters:  05/16/19 136 lb 3.2 oz (61.8 kg)  04/10/19 133 lb (60.3 kg)  03/28/19 131 lb (59.4 kg)     EXAM: General: Pt appears well and is in NAD  Hydration: Well-hydrated with moist mucous membranes and good skin turgor  Eyes: External eye exam normal without stare, lid lag or exophthalmos.  EOM intact.   Ears, Nose, Throat: Hearing: Grossly intact bilaterally Dental: Good dentition  Throat: Clear without mass, erythema or exudate  Neck: General: Supple without adenopathy. Thyroid: Thyroid size normal.  No goiter or nodules appreciated. No thyroid bruit.  Lungs: Clear with good BS bilat with no rales, rhonchi, or wheezes  Heart: Auscultation: RRR.  Abdomen: Normoactive bowel sounds, soft, nontender, without masses or organomegaly palpable  Extremities:  BL LE: No pretibial edema normal ROM and strength.  Neuro: Cranial nerves: II - XII grossly intact  Motor: Normal strength throughout DTRs: 2+ and symmetric in UE without delay in relaxation phase  Mental Status: Judgment, insight: Intact Orientation: Oriented to time, place, and person Mood and affect: No depression, anxiety, or agitation     DATA REVIEWED: Results for Autumn, Walker" (MRN [id number]) as of 05/17/2019 12:25  Ref. Range  05/16/2019 15:16  TSH Latest Ref Range: 0.35 - 4.50 uIU/mL 0.80  Triiodothyronine,Free,Serum Latest Ref Range: 2.3 - 4.2 pg/mL 3.1  Triiodothyronine (T3) Latest Ref Range: 76 - 181 ng/dL 84  U5,KYHC(WCBJSE) Latest Ref Range: 0.60 - 1.60 ng/dL 8.31      ASSESSMENT/PLAN/RECOMMENDATIONS:   1. Low T3 :   -  Pt with multiple non-specific symptoms that are NOT related to her thyroid. -  We did discuss that her TSh and FT4 were normal which is the most important TFT profile to check moving forward. Total T3 is bound to thyrodixine binding globulin, transthyretin and albumin, so any changes in these proteins is going to affect the results.Also there's a large lab variability in  measuring T3 in addition to having a shorter half life and would not recommend checking T3 in the future unless the pt has abnormal TSH  Or FT4 and if necessary to check the free form.   - Repeat TFT's are normal on today's exam which is reassuring.    F/U in 3 months or PRN   Signed electronically by: Lyndle Herrlich, MD  [facility] Endocrinology  [facility] [address] [place] [place], [place] [address] Phone: [phone] FAX: [phone]   CC: Raheel, Pangallo, DO [address] [place] [address] Phone: [phone] Fax: [phone]   Return to Endocrinology clinic as below: Future Appointments  Date Time Provider Department Center  07/15/2019  9:00 AM [facility] None  07/19/2019  8:45 AM Autumn Lot, DO [facility] None  09/17/2019  2:00 PM Shamleffer, Konrad Dolores, MD [facility] None

## 2019-05-17 LAB — T3: T3, Total: 84 ng/dL (ref 76–181)

## 2019-07-11 ENCOUNTER — Other Ambulatory Visit: Payer: Self-pay

## 2019-07-11 DIAGNOSIS — E782 Mixed hyperlipidemia: Secondary | ICD-10-CM

## 2019-07-11 DIAGNOSIS — E1169 Type 2 diabetes mellitus with other specified complication: Secondary | ICD-10-CM

## 2019-07-11 DIAGNOSIS — Z79899 Other long term (current) drug therapy: Secondary | ICD-10-CM

## 2019-07-11 DIAGNOSIS — E785 Hyperlipidemia, unspecified: Secondary | ICD-10-CM

## 2019-07-11 NOTE — Progress Notes (Signed)
cm

## 2019-07-15 ENCOUNTER — Other Ambulatory Visit: Payer: Self-pay

## 2019-07-15 ENCOUNTER — Other Ambulatory Visit: Payer: Medicare HMO

## 2019-07-15 DIAGNOSIS — E1169 Type 2 diabetes mellitus with other specified complication: Secondary | ICD-10-CM

## 2019-07-15 DIAGNOSIS — E782 Mixed hyperlipidemia: Secondary | ICD-10-CM | POA: Diagnosis not present

## 2019-07-15 DIAGNOSIS — Z79899 Other long term (current) drug therapy: Secondary | ICD-10-CM

## 2019-07-15 DIAGNOSIS — E785 Hyperlipidemia, unspecified: Secondary | ICD-10-CM

## 2019-07-16 LAB — COMPREHENSIVE METABOLIC PANEL WITH GFR
ALT: 11 IU/L (ref 0–32)
AST: 14 IU/L (ref 0–40)
Albumin/Globulin Ratio: 1.9 (ref 1.2–2.2)
Albumin: 4.5 g/dL (ref 3.8–4.8)
Alkaline Phosphatase: 51 IU/L (ref 39–117)
BUN/Creatinine Ratio: 12 (ref 12–28)
BUN: 12 mg/dL (ref 8–27)
Bilirubin Total: 0.2 mg/dL (ref 0.0–1.2)
CO2: 24 mmol/L (ref 20–29)
Calcium: 9.7 mg/dL (ref 8.7–10.3)
Chloride: 105 mmol/L (ref 96–106)
Creatinine, Ser: 0.98 mg/dL (ref 0.57–1.00)
GFR calc Af Amer: 69 mL/min/1.73
GFR calc non Af Amer: 60 mL/min/1.73
Globulin, Total: 2.4 g/dL (ref 1.5–4.5)
Glucose: 137 mg/dL — ABNORMAL HIGH (ref 65–99)
Potassium: 5.5 mmol/L — ABNORMAL HIGH (ref 3.5–5.2)
Sodium: 143 mmol/L (ref 134–144)
Total Protein: 6.9 g/dL (ref 6.0–8.5)

## 2019-07-16 LAB — LIPID PANEL
Chol/HDL Ratio: 2.7 ratio (ref 0.0–4.4)
Cholesterol, Total: 91 mg/dL — ABNORMAL LOW (ref 100–199)
HDL: 34 mg/dL — ABNORMAL LOW (ref >39)
LDL Chol Calc (NIH): 27 mg/dL (ref 0–99)
Triglycerides: 189 mg/dL — ABNORMAL HIGH (ref 0–149)
VLDL Cholesterol Cal: 30 mg/dL (ref 5–40)

## 2019-07-16 LAB — HEMOGLOBIN A1C
Est. average glucose Bld gHb Est-mCnc: 137 mg/dL
Hgb A1c MFr Bld: 6.4 % — ABNORMAL HIGH (ref 4.8–5.6)

## 2019-07-16 LAB — MAGNESIUM: Magnesium: 2 mg/dL (ref 1.6–2.3)

## 2019-07-19 ENCOUNTER — Encounter: Payer: Self-pay | Admitting: Family Medicine

## 2019-07-19 ENCOUNTER — Ambulatory Visit (INDEPENDENT_AMBULATORY_CARE_PROVIDER_SITE_OTHER): Payer: Medicare HMO | Admitting: Family Medicine

## 2019-07-19 ENCOUNTER — Other Ambulatory Visit: Payer: Self-pay | Admitting: Family Medicine

## 2019-07-19 ENCOUNTER — Other Ambulatory Visit: Payer: Self-pay

## 2019-07-19 VITALS — BP 91/60 | HR 80 | Temp 96.3°F | Ht 61.5 in | Wt 137.2 lb

## 2019-07-19 DIAGNOSIS — Z9119 Patient's noncompliance with other medical treatment and regimen: Secondary | ICD-10-CM

## 2019-07-19 DIAGNOSIS — J439 Emphysema, unspecified: Secondary | ICD-10-CM | POA: Diagnosis not present

## 2019-07-19 DIAGNOSIS — E1100 Type 2 diabetes mellitus with hyperosmolarity without nonketotic hyperglycemic-hyperosmolar coma (NKHHC): Secondary | ICD-10-CM

## 2019-07-19 DIAGNOSIS — Z716 Tobacco abuse counseling: Secondary | ICD-10-CM

## 2019-07-19 DIAGNOSIS — E875 Hyperkalemia: Secondary | ICD-10-CM

## 2019-07-19 DIAGNOSIS — E782 Mixed hyperlipidemia: Secondary | ICD-10-CM

## 2019-07-19 DIAGNOSIS — E1169 Type 2 diabetes mellitus with other specified complication: Secondary | ICD-10-CM | POA: Diagnosis not present

## 2019-07-19 DIAGNOSIS — F17200 Nicotine dependence, unspecified, uncomplicated: Secondary | ICD-10-CM

## 2019-07-19 DIAGNOSIS — D72829 Elevated white blood cell count, unspecified: Secondary | ICD-10-CM | POA: Diagnosis not present

## 2019-07-19 DIAGNOSIS — R69 Illness, unspecified: Secondary | ICD-10-CM | POA: Diagnosis not present

## 2019-07-19 DIAGNOSIS — E785 Hyperlipidemia, unspecified: Secondary | ICD-10-CM

## 2019-07-19 DIAGNOSIS — E786 Lipoprotein deficiency: Secondary | ICD-10-CM

## 2019-07-19 DIAGNOSIS — Z91199 Patient's noncompliance with other medical treatment and regimen due to unspecified reason: Secondary | ICD-10-CM

## 2019-07-19 DIAGNOSIS — E781 Pure hyperglyceridemia: Secondary | ICD-10-CM

## 2019-07-19 MED ORDER — OMEGA-3-ACID ETHYL ESTERS 1 G PO CAPS: 2.0000 g | ORAL_CAPSULE | Freq: Two times a day (BID) | ORAL | 1 refills | Status: DC

## 2019-07-19 MED ORDER — ROSUVASTATIN CALCIUM 20 MG PO TABS: 20.0000 mg | ORAL_TABLET | Freq: Every day | ORAL | 1 refills | Status: DC
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
## 2019-08-08 DIAGNOSIS — Z20828 Contact with and (suspected) exposure to other viral communicable diseases: Secondary | ICD-10-CM | POA: Diagnosis not present

## 2019-08-31 ENCOUNTER — Other Ambulatory Visit: Payer: Self-pay | Admitting: Family Medicine

## 2019-08-31 DIAGNOSIS — B0229 Other postherpetic nervous system involvement: Secondary | ICD-10-CM

## 2019-09-17 ENCOUNTER — Ambulatory Visit: Payer: Medicare HMO | Admitting: Internal Medicine

## 2019-09-25 ENCOUNTER — Other Ambulatory Visit: Payer: Self-pay | Admitting: Family Medicine

## 2019-09-25 DIAGNOSIS — B0229 Other postherpetic nervous system involvement: Secondary | ICD-10-CM

## 2019-10-15 ENCOUNTER — Other Ambulatory Visit: Payer: Self-pay

## 2019-10-15 ENCOUNTER — Other Ambulatory Visit: Payer: Medicare HMO

## 2019-10-15 DIAGNOSIS — E1100 Type 2 diabetes mellitus with hyperosmolarity without nonketotic hyperglycemic-hyperosmolar coma (NKHHC): Secondary | ICD-10-CM | POA: Diagnosis not present

## 2019-10-15 DIAGNOSIS — E785 Hyperlipidemia, unspecified: Secondary | ICD-10-CM

## 2019-10-15 DIAGNOSIS — E786 Lipoprotein deficiency: Secondary | ICD-10-CM

## 2019-10-15 DIAGNOSIS — E781 Pure hyperglyceridemia: Secondary | ICD-10-CM | POA: Diagnosis not present

## 2019-10-15 DIAGNOSIS — E782 Mixed hyperlipidemia: Secondary | ICD-10-CM | POA: Diagnosis not present

## 2019-10-15 DIAGNOSIS — E1169 Type 2 diabetes mellitus with other specified complication: Secondary | ICD-10-CM

## 2019-10-15 DIAGNOSIS — D72829 Elevated white blood cell count, unspecified: Secondary | ICD-10-CM | POA: Diagnosis not present

## 2019-10-15 DIAGNOSIS — E875 Hyperkalemia: Secondary | ICD-10-CM

## 2019-10-16 LAB — CBC WITH DIFFERENTIAL/PLATELET
Basophils Absolute: 0.2 10*3/uL (ref 0.0–0.2)
Basos: 1 %
EOS (ABSOLUTE): 0.5 10*3/uL — ABNORMAL HIGH (ref 0.0–0.4)
Eos: 4 %
Hematocrit: 41.5 % (ref 34.0–46.6)
Hemoglobin: 14.5 g/dL (ref 11.1–15.9)
Immature Grans (Abs): 0 10*3/uL (ref 0.0–0.1)
Immature Granulocytes: 0 %
Lymphocytes Absolute: 6.9 10*3/uL — ABNORMAL HIGH (ref 0.7–3.1)
Lymphs: 48 %
MCH: 32.7 pg (ref 26.6–33.0)
MCHC: 34.9 g/dL (ref 31.5–35.7)
MCV: 94 fL (ref 79–97)
Monocytes Absolute: 1.3 10*3/uL — ABNORMAL HIGH (ref 0.1–0.9)
Monocytes: 9 %
Neutrophils Absolute: 5.5 10*3/uL (ref 1.4–7.0)
Neutrophils: 38 %
Platelets: 322 10*3/uL (ref 150–450)
RBC: 4.44 x10E6/uL (ref 3.77–5.28)
RDW: 11.9 % (ref 11.7–15.4)
WBC: 14.5 10*3/uL — ABNORMAL HIGH (ref 3.4–10.8)

## 2019-10-16 LAB — HEMOGLOBIN A1C
Est. average glucose Bld gHb Est-mCnc: 160 mg/dL
Hgb A1c MFr Bld: 7.2 % — ABNORMAL HIGH (ref 4.8–5.6)

## 2019-10-16 LAB — LIPID PANEL
Chol/HDL Ratio: 2.9 ratio (ref 0.0–4.4)
Cholesterol, Total: 92 mg/dL — ABNORMAL LOW (ref 100–199)
HDL: 32 mg/dL — ABNORMAL LOW (ref >39)
LDL Chol Calc (NIH): 29 mg/dL (ref 0–99)
Triglycerides: 193 mg/dL — ABNORMAL HIGH (ref 0–149)
VLDL Cholesterol Cal: 31 mg/dL (ref 5–40)

## 2019-10-16 LAB — POTASSIUM: Potassium: 4.3 mmol/L (ref 3.5–5.2)

## 2019-10-18 ENCOUNTER — Other Ambulatory Visit: Payer: Self-pay | Admitting: Family Medicine

## 2019-10-18 DIAGNOSIS — Z1231 Encounter for screening mammogram for malignant neoplasm of breast: Secondary | ICD-10-CM

## 2019-10-19 ENCOUNTER — Other Ambulatory Visit: Payer: Self-pay | Admitting: Family Medicine

## 2019-10-19 DIAGNOSIS — J3089 Other allergic rhinitis: Secondary | ICD-10-CM

## 2019-10-21 ENCOUNTER — Telehealth: Payer: Self-pay

## 2019-10-21 NOTE — Telephone Encounter (Signed)
Please call pt to schedule appt.  No further refills until pt is seen.  T. Nelson, CMA  

## 2019-10-28 ENCOUNTER — Other Ambulatory Visit: Payer: Self-pay | Admitting: Family Medicine

## 2019-10-28 DIAGNOSIS — E1100 Type 2 diabetes mellitus with hyperosmolarity without nonketotic hyperglycemic-hyperosmolar coma (NKHHC): Secondary | ICD-10-CM

## 2019-10-30 ENCOUNTER — Telehealth: Payer: Self-pay | Admitting: Family Medicine

## 2019-10-30 NOTE — Telephone Encounter (Signed)
4/21-- Left patient message to call office to schedule required appt-- no further Rx refills till appt made.  (Please call pt to schedule appt.  No further refills until pt is seen.)  T. Meda Coffee, CMA  --FYI to med asst attempt made.  --glh

## 2019-11-16 ENCOUNTER — Other Ambulatory Visit: Payer: Self-pay | Admitting: Family Medicine

## 2019-11-16 DIAGNOSIS — J3089 Other allergic rhinitis: Secondary | ICD-10-CM

## 2019-11-22 ENCOUNTER — Other Ambulatory Visit: Payer: Self-pay

## 2019-11-22 ENCOUNTER — Ambulatory Visit
Admission: RE | Admit: 2019-11-22 | Discharge: 2019-11-22 | Disposition: A | Discharge status: Home / Self Care | Payer: Medicare HMO | Source: Ambulatory Visit | Attending: Family Medicine | Admitting: Family Medicine

## 2019-11-22 DIAGNOSIS — Z1231 Encounter for screening mammogram for malignant neoplasm of breast: Secondary | ICD-10-CM | POA: Diagnosis not present

## 2019-11-24 ENCOUNTER — Other Ambulatory Visit: Payer: Self-pay | Admitting: Family Medicine

## 2019-11-24 DIAGNOSIS — B0229 Other postherpetic nervous system involvement: Secondary | ICD-10-CM

## 2019-11-25 ENCOUNTER — Encounter (INDEPENDENT_AMBULATORY_CARE_PROVIDER_SITE_OTHER): Payer: Self-pay | Admitting: Family Medicine

## 2019-11-25 MED ORDER — GABAPENTIN 300 MG PO CAPS: ORAL_CAPSULE | ORAL | 0 refills | Status: DC

## 2019-11-26 ENCOUNTER — Other Ambulatory Visit: Payer: Self-pay | Admitting: Family Medicine

## 2019-11-26 DIAGNOSIS — B0229 Other postherpetic nervous system involvement: Secondary | ICD-10-CM

## 2019-11-26 NOTE — Telephone Encounter (Signed)
Please review

## 2019-12-03 ENCOUNTER — Other Ambulatory Visit: Payer: Self-pay | Admitting: Family Medicine

## 2019-12-03 DIAGNOSIS — E1100 Type 2 diabetes mellitus with hyperosmolarity without nonketotic hyperglycemic-hyperosmolar coma (NKHHC): Secondary | ICD-10-CM

## 2019-12-05 ENCOUNTER — Other Ambulatory Visit: Payer: Self-pay | Admitting: Family Medicine

## 2019-12-05 DIAGNOSIS — E1100 Type 2 diabetes mellitus with hyperosmolarity without nonketotic hyperglycemic-hyperosmolar coma (NKHHC): Secondary | ICD-10-CM

## 2019-12-05 MED ORDER — LOSARTAN POTASSIUM 25 MG PO TABS: 25.0000 mg | ORAL_TABLET | Freq: Every day | ORAL | 0 refills | Status: DC

## 2019-12-25 ENCOUNTER — Telehealth (INDEPENDENT_AMBULATORY_CARE_PROVIDER_SITE_OTHER): Payer: Medicare HMO | Admitting: Physician Assistant

## 2019-12-25 ENCOUNTER — Other Ambulatory Visit: Payer: Self-pay

## 2019-12-25 ENCOUNTER — Encounter: Payer: Self-pay | Admitting: Physician Assistant

## 2019-12-25 VITALS — BP 99/63 | Temp 96.3°F | Ht 61.5 in | Wt 136.7 lb

## 2019-12-25 DIAGNOSIS — E785 Hyperlipidemia, unspecified: Secondary | ICD-10-CM

## 2019-12-25 DIAGNOSIS — J3089 Other allergic rhinitis: Secondary | ICD-10-CM

## 2019-12-25 DIAGNOSIS — J9809 Other diseases of bronchus, not elsewhere classified: Secondary | ICD-10-CM | POA: Diagnosis not present

## 2019-12-25 DIAGNOSIS — E1169 Type 2 diabetes mellitus with other specified complication: Secondary | ICD-10-CM | POA: Diagnosis not present

## 2019-12-25 DIAGNOSIS — B0229 Other postherpetic nervous system involvement: Secondary | ICD-10-CM | POA: Diagnosis not present

## 2019-12-25 DIAGNOSIS — R600 Localized edema: Secondary | ICD-10-CM | POA: Diagnosis not present

## 2019-12-25 DIAGNOSIS — E1100 Type 2 diabetes mellitus with hyperosmolarity without nonketotic hyperglycemic-hyperosmolar coma (NKHHC): Secondary | ICD-10-CM | POA: Diagnosis not present

## 2019-12-25 LAB — BASIC METABOLIC PANEL: Glucose: 135

## 2019-12-25 MED ORDER — GABAPENTIN 300 MG PO CAPS: ORAL_CAPSULE | ORAL | 0 refills | Status: DC

## 2019-12-25 MED ORDER — ALBUTEROL SULFATE HFA 108 (90 BASE) MCG/ACT IN AERS: 1.0000 | INHALATION_SPRAY | RESPIRATORY_TRACT | 0 refills | Status: DC | PRN

## 2019-12-25 NOTE — Assessment & Plan Note (Signed)
-   Most recent A1c is 7.2, stable. - Continue Metformin. - Continue ambulatory glucose monitoring and notify clinic if FBS consistently <80 or >160. - Follow low glucose/carbohydrate diet and continue physical activity level.  - Advised patient next OV should be in office to recheck A1c, perform foot exam and UA microalbumin.
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
## 2019-12-25 NOTE — Assessment & Plan Note (Signed)
-   Last lipid panel: total cholesterol decreased, triglycerides elevated, and LDL at goal - Continue Crestor and Lovaza. - Follow heart healthy diet and stay as active as possible.  - Plan to recheck lipid panel and hepatic function next OV in 3 months

## 2019-12-25 NOTE — Assessment & Plan Note (Signed)
-   Requesting refill of Gabapentin, which helps to control neuralgia from shingles.

## 2019-12-25 NOTE — Assessment & Plan Note (Signed)
>>  ASSESSMENT AND PLAN FOR HYPERLIPIDEMIA ASSOCIATED WITH TYPE 2 DIABETES MELLITUS ([place]) WRITTEN ON 12/25/2019  4:28 PM BY ABONZA, MARITZA, PA-C  - Last lipid panel: total cholesterol decreased, triglycerides elevated, and LDL at goal - Continue Crestor and Lovaza. - Follow heart healthy diet and stay as active as possible.  - Plan to recheck lipid panel and hepatic function next OV in 3 months

## 2020-01-04 ENCOUNTER — Other Ambulatory Visit: Payer: Self-pay | Admitting: Physician Assistant

## 2020-01-04 DIAGNOSIS — E1100 Type 2 diabetes mellitus with hyperosmolarity without nonketotic hyperglycemic-hyperosmolar coma (NKHHC): Secondary | ICD-10-CM

## 2020-01-06 DIAGNOSIS — H5213 Myopia, bilateral: Secondary | ICD-10-CM | POA: Diagnosis not present

## 2020-01-06 DIAGNOSIS — H52223 Regular astigmatism, bilateral: Secondary | ICD-10-CM | POA: Diagnosis not present

## 2020-01-06 DIAGNOSIS — H524 Presbyopia: Secondary | ICD-10-CM | POA: Diagnosis not present

## 2020-01-15 ENCOUNTER — Other Ambulatory Visit: Payer: Self-pay | Admitting: Physician Assistant

## 2020-01-15 DIAGNOSIS — J3089 Other allergic rhinitis: Secondary | ICD-10-CM

## 2020-01-15 DIAGNOSIS — J9809 Other diseases of bronchus, not elsewhere classified: Secondary | ICD-10-CM

## 2020-01-17 DIAGNOSIS — E119 Type 2 diabetes mellitus without complications: Secondary | ICD-10-CM | POA: Diagnosis not present

## 2020-01-17 DIAGNOSIS — H2513 Age-related nuclear cataract, bilateral: Secondary | ICD-10-CM | POA: Diagnosis not present

## 2020-01-17 DIAGNOSIS — H40013 Open angle with borderline findings, low risk, bilateral: Secondary | ICD-10-CM | POA: Diagnosis not present

## 2020-01-17 LAB — HM DIABETES EYE EXAM

## 2020-01-19 ENCOUNTER — Other Ambulatory Visit: Payer: Self-pay | Admitting: Family Medicine

## 2020-01-19 DIAGNOSIS — E781 Pure hyperglyceridemia: Secondary | ICD-10-CM

## 2020-01-19 DIAGNOSIS — E786 Lipoprotein deficiency: Secondary | ICD-10-CM

## 2020-01-19 DIAGNOSIS — E1169 Type 2 diabetes mellitus with other specified complication: Secondary | ICD-10-CM

## 2020-02-13 ENCOUNTER — Telehealth: Payer: Self-pay | Admitting: Physician Assistant

## 2020-02-13 ENCOUNTER — Encounter: Payer: Self-pay | Admitting: Physician Assistant

## 2020-02-13 DIAGNOSIS — E786 Lipoprotein deficiency: Secondary | ICD-10-CM

## 2020-02-13 DIAGNOSIS — E781 Pure hyperglyceridemia: Secondary | ICD-10-CM

## 2020-02-13 DIAGNOSIS — E1169 Type 2 diabetes mellitus with other specified complication: Secondary | ICD-10-CM

## 2020-02-13 DIAGNOSIS — E785 Hyperlipidemia, unspecified: Secondary | ICD-10-CM

## 2020-02-13 MED ORDER — ROSUVASTATIN CALCIUM 20 MG PO TABS: 20.0000 mg | ORAL_TABLET | Freq: Every day | ORAL | 0 refills | Status: DC

## 2020-02-13 NOTE — Telephone Encounter (Signed)
Patient is requesting a refill of her Crestor, if approved please send to [facility] on [address]

## 2020-02-13 NOTE — Addendum Note (Signed)
Addended by: Mickel Crow on: 02/13/2020 11:08 AM   Modules accepted: Orders

## 2020-02-13 NOTE — Telephone Encounter (Signed)
Refill sent to requested pharmacy. AS, CMA 

## 2020-02-14 ENCOUNTER — Other Ambulatory Visit: Payer: Self-pay | Admitting: Physician Assistant

## 2020-02-14 DIAGNOSIS — J3089 Other allergic rhinitis: Secondary | ICD-10-CM

## 2020-04-02 ENCOUNTER — Other Ambulatory Visit: Payer: Self-pay | Admitting: Physician Assistant

## 2020-04-02 DIAGNOSIS — E1100 Type 2 diabetes mellitus with hyperosmolarity without nonketotic hyperglycemic-hyperosmolar coma (NKHHC): Secondary | ICD-10-CM

## 2020-04-09 ENCOUNTER — Encounter: Payer: Self-pay | Admitting: Physician Assistant

## 2020-04-09 ENCOUNTER — Other Ambulatory Visit: Payer: Self-pay

## 2020-04-09 ENCOUNTER — Ambulatory Visit (INDEPENDENT_AMBULATORY_CARE_PROVIDER_SITE_OTHER): Payer: Medicare HMO | Admitting: Physician Assistant

## 2020-04-09 VITALS — BP 104/60 | HR 94 | Ht 61.5 in | Wt 134.7 lb

## 2020-04-09 DIAGNOSIS — E785 Hyperlipidemia, unspecified: Secondary | ICD-10-CM

## 2020-04-09 DIAGNOSIS — E1100 Type 2 diabetes mellitus with hyperosmolarity without nonketotic hyperglycemic-hyperosmolar coma (NKHHC): Secondary | ICD-10-CM | POA: Diagnosis not present

## 2020-04-09 DIAGNOSIS — E781 Pure hyperglyceridemia: Secondary | ICD-10-CM | POA: Diagnosis not present

## 2020-04-09 DIAGNOSIS — R011 Cardiac murmur, unspecified: Secondary | ICD-10-CM

## 2020-04-09 DIAGNOSIS — E1169 Type 2 diabetes mellitus with other specified complication: Secondary | ICD-10-CM

## 2020-04-09 LAB — POCT GLYCOSYLATED HEMOGLOBIN (HGB A1C): Hemoglobin A1C: 6.9 % — AB (ref 4.0–5.6)

## 2020-04-09 LAB — POCT UA - MICROALBUMIN
Creatinine, POC: 50 mg/dL
Microalbumin Ur, POC: 10 mg/L

## 2020-04-09 NOTE — Progress Notes (Signed)
Established Patient Office Visit  Subjective:  Patient ID: Autumn Walker, female    DOB: 05-24-1951  Age: 69 y.o. MRN: [id number]  CC:  Chief Complaint  Patient presents with  . Diabetes  . Hyperlipidemia    HPI Autumn Walker presents for follow-up on diabetes mellitus and hyperlipidemia.  Has no acute concerns today.  Diabetes Mellitus: Pt denies increased urination or thirst. Pt reports medication compliance. No hypoglycemic events. Checking glucose at home. FBS range from 140-160.  Hyperlipidemia: Taking medications without issue.  Denies side effects.  Past Medical History:  Diagnosis Date  . Allergy   . Blood transfusion without reported diagnosis   . Cataract   . Diabetes mellitus without complication [facility])     Past Surgical History:  Procedure Laterality Date  . APPENDECTOMY    . CHOLECYSTECTOMY    . PANCREATECTOMY    . SPLENECTOMY, TOTAL      Family History  Problem Relation Age of Onset  . Thyroid disease Mother   . Heart disease Father   . Alcohol abuse Father   . Heart attack Father   . Hyperlipidemia Father   . Depression Sister   . Thyroid disease Sister   . Alcohol abuse Brother   . Thyroid disease Daughter   . Diabetes Maternal Grandmother   . Heart disease Maternal Grandfather   . Cancer Paternal Grandfather        prostate  . Hypertension Paternal Grandfather   . Colon cancer Neg Hx     Social History   Socioeconomic History  . Marital status: Married    Spouse name: Not on file  . Number of children: Not on file  . Years of education: Not on file  . Highest education level: Not on file  Occupational History  . Not on file  Tobacco Use  . Smoking status: Current Some Day Smoker    Packs/day: 0.00    Years: 40.00    Pack years: 0.00    Types: Cigarettes  . Smokeless tobacco: Never Used  . Tobacco comment: 2-3 cigs weekly   Vaping Use  . Vaping Use: Never used  Substance and Sexual Activity  . Alcohol  use: No  . Drug use: No  . Sexual activity: Yes  Other Topics Concern  . Not on file  Social History Narrative  . Not on file   Social Determinants of Health   Financial Resource Strain:   . Difficulty of Paying Living Expenses: Not on file  Food Insecurity:   . Worried About Programme researcher, broadcasting/film/video in the Last Year: Not on file  . Ran Out of Food in the Last Year: Not on file  Transportation Needs:   . Lack of Transportation (Medical): Not on file  . Lack of Transportation (Non-Medical): Not on file  Physical Activity:   . Days of Exercise per Week: Not on file  . Minutes of Exercise per Session: Not on file  Stress:   . Feeling of Stress : Not on file  Social Connections:   . Frequency of Communication with Friends and Family: Not on file  . Frequency of Social Gatherings with Friends and Family: Not on file  . Attends Religious Services: Not on file  . Active Member of Clubs or Organizations: Not on file  . Attends Banker Meetings: Not on file  . Marital Status: Not on file  Intimate Partner Violence:   . Fear of Current or Ex-Partner: Not on file  .  Emotionally Abused: Not on file  . Physically Abused: Not on file  . Sexually Abused: Not on file    Outpatient Medications Prior to Visit  Medication Sig Dispense Refill  . albuterol (VENTOLIN HFA) 108 (90 Base) MCG/ACT inhaler INHALE 1-2 PUFFS INTO THE LUNGS EVERY 4 (FOUR) HOURS AS NEEDED FOR WHEEZING OR SHORTNESS OF BREATH. 18 g 2  . ezetimibe (ZETIA) 10 MG tablet Take 1 tablet (10 mg total) by mouth daily after supper. 90 tablet 3  . fluticasone (FLONASE) 50 MCG/ACT nasal spray USE 1 SPRAY IN EACH NOSTRIL TWICE DAILY FOLLOWING SINUS RINSES 48 mL 0  . gabapentin (NEURONTIN) 300 MG capsule TAKE 1 CAPSULE IN AM, 1 CAPSULE IN AFTERNOON AND 2 CAPSULES AT BEDTIME 360 capsule 0  . ibuprofen (ADVIL,MOTRIN) 200 MG tablet Take 600 mg by mouth every 6 (six) hours as needed.    Marland Kitchen losartan (COZAAR) 25 MG tablet TAKE 1  TABLET BY MOUTH EVERY DAY 90 tablet 0  . metFORMIN (GLUCOPHAGE) 1000 MG tablet Take 1 tablet (1,000 mg total) by mouth 2 (two) times daily with a meal. 180 tablet 3  . Methylcobalamin (B12-ACTIVE PO) Take by mouth.    . montelukast (SINGULAIR) 10 MG tablet TAKE 1 TABLET BY MOUTH EVERYDAY AT BEDTIME 90 tablet 0  . omega-3 acid ethyl esters (LOVAZA) 1 g capsule Take 2 capsules (2 g total) by mouth 2 (two) times daily. 360 capsule 1  . rosuvastatin (CRESTOR) 20 MG tablet Take 1 tablet (20 mg total) by mouth at bedtime. 90 tablet 0  . VITAMIN D PO Take by mouth. 50000 iu weekly     No facility-administered medications prior to visit.    Allergies  Allergen Reactions  . Atorvastatin Other (See Comments)    Leg cramps  . Codeine Nausea Only    ROS Review of Systems A fourteen system review of systems was performed and found to be positive as per HPI.  Objective:    Physical Exam General:  Well Developed, well nourished, appropriate for stated age.  Neuro:  Alert and oriented,  extra-ocular muscles intact  HEENT:  Normocephalic, atraumatic, neck supple Skin:  no gross rash, warm, pink. Cardiac:  RRR, S1 S2 Respiratory:  ECTA B/L and A/P, Not using accessory muscles, speaking in full sentences- unlabored. Vascular:  Ext warm, no cyanosis apprec.; cap RF less 2 sec. Psych:  No HI/SI, judgement and insight good, Euthymic mood. Full Affect.   BP 104/60   Pulse 94   Ht 5' 1.5" (1.562 m)   Wt 134 lb 11.2 oz (61.1 kg)   SpO2 94%   BMI 25.04 kg/m  Wt Readings from Last 3 Encounters:  04/09/20 134 lb 11.2 oz (61.1 kg)  12/25/19 136 lb 11.2 oz (62 kg)  07/19/19 137 lb 3.2 oz (62.2 kg)     Health Maintenance Due  Topic Date Due  . DEXA SCAN  Never done  . COLONOSCOPY  05/30/2017  . PNA vac Low Risk Adult (2 of 2 - PPSV23) 09/21/2018  . FOOT EXAM  01/23/2019  . INFLUENZA VACCINE  02/09/2020    There are no preventive care reminders to display for this patient.  Lab Results   Component Value Date   TSH 0.80 05/16/2019   Lab Results  Component Value Date   WBC 14.5 (H) 10/15/2019   HGB 14.5 10/15/2019   HCT 41.5 10/15/2019   MCV 94 10/15/2019   PLT 322 10/15/2019   Lab Results  Component Value Date  NA 143 04/09/2020   K 5.3 (H) 04/09/2020   CO2 24 04/09/2020   GLUCOSE 123 (H) 04/09/2020   BUN 14 04/09/2020   CREATININE 0.79 04/09/2020   BILITOT 0.2 04/09/2020   ALKPHOS 54 04/09/2020   AST 16 04/09/2020   ALT 13 04/09/2020   PROT 7.2 04/09/2020   ALBUMIN 4.8 04/09/2020   CALCIUM 9.7 04/09/2020   Lab Results  Component Value Date   CHOL 90 (L) 04/09/2020   Lab Results  Component Value Date   HDL 37 (L) 04/09/2020   Lab Results  Component Value Date   LDLCALC 28 04/09/2020   Lab Results  Component Value Date   TRIG 146 04/09/2020   Lab Results  Component Value Date   CHOLHDL 2.4 04/09/2020   Lab Results  Component Value Date   HGBA1C 6.9 (A) 04/09/2020      Assessment & Plan:   Problem List Items Addressed This Visit      Endocrine   Diabetes (HCC) - Primary (Chronic)   Relevant Orders   POCT glycosylated hemoglobin (Hb A1C) (Completed)   POCT UA - Microalbumin (Completed)   Comp Met (CMET) (Completed)   Hyperlipidemia associated with type 2 diabetes mellitus (HCC) (Chronic)   Relevant Orders   Comp Met (CMET) (Completed)   Lipid Profile (Completed)    Other Visit Diagnoses    Murmur       Relevant Orders   ECHOCARDIOGRAM COMPLETE     Diabetes mellitus with hyperosmolarity: -A1c has improved from 7.2 to 6.9 -Continue current medication regimen. -Follow a low carbohydrate and glucose diet. -Will continue to monitor.  Hyperlipidemia associated with type 2 diabetes mellitus, Hypertriglyceridemia: -Last lipid panel: Total cholesterol 92, triglycerides 193, HDL 32, LDL 29 -Continue current medication regimen. -Follow a heart healthy diet, monitor simple carbohydrates, and continue to stay as active as  possible. -Will recheck lipid panel and hepatic function today.  Murmur: -Patient denies prior history of heart murmur. Possible heart murmur heard on auscultation. -Will place order for echocardiogram for further evaluation.  No orders of the defined types were placed in this encounter.   Follow-up: Return in about 3 months (around 07/09/2020) for DM, HLD.   Note:  This note was prepared with assistance of Dragon voice recognition software. Occasional wrong-word or sound-a-like substitutions may have occurred due to the inherent limitations of voice recognition software.   Mayer Masker, PA-C

## 2020-04-09 NOTE — Patient Instructions (Signed)
High Cholesterol  High cholesterol is a condition in which the blood has high levels of a white, waxy, fat-like substance (cholesterol). The human body needs small amounts of cholesterol. The liver makes all the cholesterol that the body needs. Extra (excess) cholesterol comes from the food that we eat. Cholesterol is carried from the liver by the blood through the blood vessels. If you have high cholesterol, deposits (plaques) may build up on the walls of your blood vessels (arteries). Plaques make the arteries narrower and stiffer. Cholesterol plaques increase your risk for heart attack and stroke. Work with your health care provider to keep your cholesterol levels in a healthy range. What increases the risk? This condition is more likely to develop in people who:  Eat foods that are high in animal fat (saturated fat) or cholesterol.  Are overweight.  Are not getting enough exercise.  Have a family history of high cholesterol. What are the signs or symptoms? There are no symptoms of this condition. How is this diagnosed? This condition may be diagnosed from the results of a blood test.  If you are older than age 20, your health care provider may check your cholesterol every 4-6 years.  You may be checked more often if you already have high cholesterol or other risk factors for heart disease. The blood test for cholesterol measures:  "Bad" cholesterol (LDL cholesterol). This is the main type of cholesterol that causes heart disease. The desired level for LDL is less than 100.  "Good" cholesterol (HDL cholesterol). This type helps to protect against heart disease by cleaning the arteries and carrying the LDL away. The desired level for HDL is 60 or higher.  Triglycerides. These are fats that the body can store or burn for energy. The desired number for triglycerides is lower than 150.  Total cholesterol. This is a measure of the total amount of cholesterol in your blood, including LDL  cholesterol, HDL cholesterol, and triglycerides. A healthy number is less than 200. How is this treated? This condition is treated with diet changes, lifestyle changes, and medicines. Diet changes  This may include eating more whole grains, fruits, vegetables, nuts, and fish.  This may also include cutting back on red meat and foods that have a lot of added sugar. Lifestyle changes  Changes may include getting at least 40 minutes of aerobic exercise 3 times a week. Aerobic exercises include walking, biking, and swimming. Aerobic exercise along with a healthy diet can help you maintain a healthy weight.  Changes may also include quitting smoking. Medicines  Medicines are usually given if diet and lifestyle changes have failed to reduce your cholesterol to healthy levels.  Your health care provider may prescribe a statin medicine. Statin medicines have been shown to reduce cholesterol, which can reduce the risk of heart disease. Follow these instructions at home: Eating and drinking If told by your health care provider:  Eat chicken (without skin), fish, veal, shellfish, ground turkey breast, and round or loin cuts of red meat.  Do not eat fried foods or fatty meats, such as hot dogs and salami.  Eat plenty of fruits, such as apples.  Eat plenty of vegetables, such as broccoli, potatoes, and carrots.  Eat beans, peas, and lentils.  Eat grains such as barley, rice, couscous, and bulgur wheat.  Eat pasta without cream sauces.  Use skim or nonfat milk, and eat low-fat or nonfat yogurt and cheeses.  Do not eat or drink whole milk, cream, ice cream, egg yolks,   or hard cheeses.  Do not eat stick margarine or tub margarines that contain trans fats (also called partially hydrogenated oils).  Do not eat saturated tropical oils, such as coconut oil and palm oil.  Do not eat cakes, cookies, crackers, or other baked goods that contain trans fats.  General instructions  Exercise as  directed by your health care provider. Increase your activity level with activities such as gardening, walking, and taking the stairs.  Take over-the-counter and prescription medicines only as told by your health care provider.  Do not use any products that contain nicotine or tobacco, such as cigarettes and e-cigarettes. If you need help quitting, ask your health care provider.  Keep all follow-up visits as told by your health care provider. This is important. Contact a health care provider if:  You are struggling to maintain a healthy diet or weight.  You need help to start on an exercise program.  You need help to stop smoking. Get help right away if:  You have chest pain.  You have trouble breathing. This information is not intended to replace advice given to you by your health care provider. Make sure you discuss any questions you have with your health care provider. Document Revised: 06/30/2017 Document Reviewed: 12/26/2015 Elsevier Patient Education  [place]. Diabetes Mellitus and Green care is an important part of your health, especially when you have diabetes. Diabetes may cause you to have problems because of poor blood flow (circulation) to your feet and legs, which can cause your skin to:  Become thinner and drier.  Break more easily.  Heal more slowly.  Peel and crack. You may also have nerve damage (neuropathy) in your legs and feet, causing decreased feeling in them. This means that you may not notice minor injuries to your feet that could lead to more serious problems. Noticing and addressing any potential problems early is the best way to prevent future foot problems. How to care for your feet Foot hygiene  Wash your feet daily with warm water and mild soap. Do not use hot water. Then, pat your feet and the areas between your toes until they are completely dry. Do not soak your feet as this can dry your skin.  Trim your toenails straight  across. Do not dig under them or around the cuticle. File the edges of your nails with an emery board or nail file.  Apply a moisturizing lotion or petroleum jelly to the skin on your feet and to dry, brittle toenails. Use lotion that does not contain alcohol and is unscented. Do not apply lotion between your toes. Shoes and socks  Wear clean socks or stockings every day. Make sure they are not too tight. Do not wear knee-high stockings since they may decrease blood flow to your legs.  Wear shoes that fit properly and have enough cushioning. Always look in your shoes before you put them on to be sure there are no objects inside.  To break in new shoes, wear them for just a few hours a day. This prevents injuries on your feet. Wounds, scrapes, corns, and calluses  Check your feet daily for blisters, cuts, bruises, sores, and redness. If you cannot see the bottom of your feet, use a mirror or ask someone for help.  Do not cut corns or calluses or try to remove them with medicine.  If you find a minor scrape, cut, or break in the skin on your feet, keep it and the skin  around it clean and dry. You may clean these areas with mild soap and water. Do not clean the area with peroxide, alcohol, or iodine.  If you have a wound, scrape, corn, or callus on your foot, look at it several times a day to make sure it is healing and not infected. Check for: ? Redness, swelling, or pain. ? Fluid or blood. ? Warmth. ? Pus or a bad smell. General instructions  Do not cross your legs. This may decrease blood flow to your feet.  Do not use heating pads or hot water bottles on your feet. They may burn your skin. If you have lost feeling in your feet or legs, you may not know this is happening until it is too late.  Protect your feet from hot and cold by wearing shoes, such as at the beach or on hot pavement.  Schedule a complete foot exam at least once a year (annually) or more often if you have foot  problems. If you have foot problems, report any cuts, sores, or bruises to your health care provider immediately. Contact a health care provider if:  You have a medical condition that increases your risk of infection and you have any cuts, sores, or bruises on your feet.  You have an injury that is not healing.  You have redness on your legs or feet.  You feel burning or tingling in your legs or feet.  You have pain or cramps in your legs and feet.  Your legs or feet are numb.  Your feet always feel cold.  You have pain around a toenail. Get help right away if:  You have a wound, scrape, corn, or callus on your foot and: ? You have pain, swelling, or redness that gets worse. ? You have fluid or blood coming from the wound, scrape, corn, or callus. ? Your wound, scrape, corn, or callus feels warm to the touch. ? You have pus or a bad smell coming from the wound, scrape, corn, or callus. ? You have a fever. ? You have a red line going up your leg. Summary  Check your feet every day for cuts, sores, red spots, swelling, and blisters.  Moisturize feet and legs daily.  Wear shoes that fit properly and have enough cushioning.  If you have foot problems, report any cuts, sores, or bruises to your health care provider immediately.  Schedule a complete foot exam at least once a year (annually) or more often if you have foot problems. This information is not intended to replace advice given to you by your health care provider. Make sure you discuss any questions you have with your health care provider. Document Revised: 03/20/2019 Document Reviewed: 07/29/2016 Elsevier Patient Education  [place].

## 2020-04-10 LAB — COMPREHENSIVE METABOLIC PANEL
ALT: 13 IU/L (ref 0–32)
AST: 16 IU/L (ref 0–40)
Albumin/Globulin Ratio: 2 (ref 1.2–2.2)
Albumin: 4.8 g/dL (ref 3.8–4.8)
Alkaline Phosphatase: 54 IU/L (ref 44–121)
BUN/Creatinine Ratio: 18 (ref 12–28)
BUN: 14 mg/dL (ref 8–27)
Bilirubin Total: 0.2 mg/dL (ref 0.0–1.2)
CO2: 24 mmol/L (ref 20–29)
Calcium: 9.7 mg/dL (ref 8.7–10.3)
Chloride: 105 mmol/L (ref 96–106)
Creatinine, Ser: 0.79 mg/dL (ref 0.57–1.00)
GFR calc Af Amer: 89 mL/min/{1.73_m2} (ref >59)
GFR calc non Af Amer: 77 mL/min/{1.73_m2} (ref >59)
Globulin, Total: 2.4 g/dL (ref 1.5–4.5)
Glucose: 123 mg/dL — ABNORMAL HIGH (ref 65–99)
Potassium: 5.3 mmol/L — ABNORMAL HIGH (ref 3.5–5.2)
Sodium: 143 mmol/L (ref 134–144)
Total Protein: 7.2 g/dL (ref 6.0–8.5)

## 2020-04-10 LAB — LIPID PANEL
Chol/HDL Ratio: 2.4 ratio (ref 0.0–4.4)
Cholesterol, Total: 90 mg/dL — ABNORMAL LOW (ref 100–199)
HDL: 37 mg/dL — ABNORMAL LOW (ref >39)
LDL Chol Calc (NIH): 28 mg/dL (ref 0–99)
Triglycerides: 146 mg/dL (ref 0–149)
VLDL Cholesterol Cal: 25 mg/dL (ref 5–40)

## 2020-04-21 ENCOUNTER — Other Ambulatory Visit: Payer: Self-pay | Admitting: Family Medicine

## 2020-04-23 ENCOUNTER — Other Ambulatory Visit: Payer: Self-pay

## 2020-04-23 ENCOUNTER — Ambulatory Visit (HOSPITAL_COMMUNITY): Payer: Medicare HMO | Attending: Cardiology

## 2020-04-23 DIAGNOSIS — R011 Cardiac murmur, unspecified: Secondary | ICD-10-CM | POA: Insufficient documentation

## 2020-04-23 LAB — ECHOCARDIOGRAM COMPLETE
AR max vel: 0.91 cm2
AV Area VTI: 1.23 cm2
AV Area mean vel: 0.94 cm2
AV Mean grad: 13.5 mmHg
AV Peak grad: 26.2 mmHg
Ao pk vel: 2.56 m/s
Area-P 1/2: 4.12 cm2
P 1/2 time: 292 msec
S' Lateral: 1.5 cm

## 2020-04-27 ENCOUNTER — Other Ambulatory Visit: Payer: Self-pay | Admitting: Physician Assistant

## 2020-04-27 DIAGNOSIS — J9809 Other diseases of bronchus, not elsewhere classified: Secondary | ICD-10-CM

## 2020-04-27 DIAGNOSIS — J3089 Other allergic rhinitis: Secondary | ICD-10-CM

## 2020-05-12 ENCOUNTER — Other Ambulatory Visit: Payer: Self-pay | Admitting: Physician Assistant

## 2020-05-12 DIAGNOSIS — E786 Lipoprotein deficiency: Secondary | ICD-10-CM

## 2020-05-12 DIAGNOSIS — E1169 Type 2 diabetes mellitus with other specified complication: Secondary | ICD-10-CM

## 2020-05-12 DIAGNOSIS — E781 Pure hyperglyceridemia: Secondary | ICD-10-CM

## 2020-05-12 DIAGNOSIS — E782 Mixed hyperlipidemia: Secondary | ICD-10-CM

## 2020-05-13 ENCOUNTER — Other Ambulatory Visit: Payer: Self-pay | Admitting: Physician Assistant

## 2020-05-13 DIAGNOSIS — J3089 Other allergic rhinitis: Secondary | ICD-10-CM

## 2020-05-14 ENCOUNTER — Other Ambulatory Visit: Payer: Self-pay | Admitting: Physician Assistant

## 2020-05-14 DIAGNOSIS — B0229 Other postherpetic nervous system involvement: Secondary | ICD-10-CM

## 2020-05-14 MED ORDER — GABAPENTIN 300 MG PO CAPS: ORAL_CAPSULE | ORAL | 0 refills | Status: DC

## 2020-05-20 ENCOUNTER — Encounter: Payer: Self-pay | Admitting: Physician Assistant

## 2020-05-20 ENCOUNTER — Telehealth: Payer: Self-pay

## 2020-05-20 ENCOUNTER — Telehealth: Payer: Self-pay | Admitting: Physician Assistant

## 2020-05-20 ENCOUNTER — Other Ambulatory Visit: Payer: Self-pay | Admitting: Physician Assistant

## 2020-05-20 DIAGNOSIS — E782 Mixed hyperlipidemia: Secondary | ICD-10-CM

## 2020-05-20 DIAGNOSIS — E781 Pure hyperglyceridemia: Secondary | ICD-10-CM

## 2020-05-20 DIAGNOSIS — E1100 Type 2 diabetes mellitus with hyperosmolarity without nonketotic hyperglycemic-hyperosmolar coma (NKHHC): Secondary | ICD-10-CM

## 2020-05-20 DIAGNOSIS — E786 Lipoprotein deficiency: Secondary | ICD-10-CM

## 2020-05-20 DIAGNOSIS — J3089 Other allergic rhinitis: Secondary | ICD-10-CM

## 2020-05-20 DIAGNOSIS — E1169 Type 2 diabetes mellitus with other specified complication: Secondary | ICD-10-CM

## 2020-05-20 DIAGNOSIS — E785 Hyperlipidemia, unspecified: Secondary | ICD-10-CM

## 2020-05-20 MED ORDER — EZETIMIBE 10 MG PO TABS: 10.0000 mg | ORAL_TABLET | Freq: Every day | ORAL | 0 refills | Status: DC

## 2020-05-20 NOTE — Telephone Encounter (Signed)
Pt called back stating that she needs the ezetimibe sent to [facility], rather than [facility] due to cost.  RX resent to [facility].  Charyl Bigger, CMA

## 2020-05-20 NOTE — Telephone Encounter (Signed)
Lissa Hoard, CMA

## 2020-05-20 NOTE — Telephone Encounter (Signed)
Patient would like her medication sent to [facility] as it is cheaper there, it was [facility]. Thanks

## 2020-05-27 ENCOUNTER — Other Ambulatory Visit: Payer: Self-pay

## 2020-05-27 ENCOUNTER — Telehealth: Payer: Self-pay | Admitting: Physician Assistant

## 2020-05-27 ENCOUNTER — Ambulatory Visit (INDEPENDENT_AMBULATORY_CARE_PROVIDER_SITE_OTHER): Payer: Medicare HMO | Admitting: Physician Assistant

## 2020-05-27 ENCOUNTER — Encounter: Payer: Self-pay | Admitting: Physician Assistant

## 2020-05-27 DIAGNOSIS — J069 Acute upper respiratory infection, unspecified: Secondary | ICD-10-CM | POA: Diagnosis not present

## 2020-05-27 MED ORDER — AMOXICILLIN-POT CLAVULANATE 875-125 MG PO TABS: 1.0000 | ORAL_TABLET | Freq: Two times a day (BID) | ORAL | 0 refills | Status: DC

## 2020-05-27 MED ORDER — FLUCONAZOLE 150 MG PO TABS: 150.0000 mg | ORAL_TABLET | Freq: Once | ORAL | 0 refills | Status: AC
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
## 2020-05-27 NOTE — Telephone Encounter (Signed)
Left message for patient to call back. AS, CMA 

## 2020-05-27 NOTE — Telephone Encounter (Signed)
Patient called in stating she has sinus infection on one side of face and would like to get a prescription called in if possible. She also gets yeast infections when prescribed antibiotics and would like a prescription for that as well. [facility] [address]. Thanks

## 2020-05-28 NOTE — Telephone Encounter (Signed)
Patient called office back and was worked into schedule for telehealth. AS, CMA

## 2020-07-15 ENCOUNTER — Other Ambulatory Visit: Payer: Self-pay

## 2020-07-15 ENCOUNTER — Ambulatory Visit: Payer: Medicare HMO | Admitting: Physician Assistant

## 2020-07-15 ENCOUNTER — Telehealth: Payer: Self-pay | Admitting: Physician Assistant

## 2020-07-15 DIAGNOSIS — E1100 Type 2 diabetes mellitus with hyperosmolarity without nonketotic hyperglycemic-hyperosmolar coma (NKHHC): Secondary | ICD-10-CM

## 2020-07-15 MED ORDER — LOSARTAN POTASSIUM 25 MG PO TABS: 25.0000 mg | ORAL_TABLET | Freq: Every day | ORAL | 0 refills | Status: DC

## 2020-07-15 NOTE — Addendum Note (Signed)
Addended by: Sylvester Harder on: 07/15/2020 12:21 PM   Modules accepted: Orders

## 2020-07-15 NOTE — Telephone Encounter (Signed)
Patient has only three pills left and needs a refill on Losartan. [facility] [address]. She was a no show today.

## 2020-08-11 ENCOUNTER — Other Ambulatory Visit: Payer: Self-pay

## 2020-08-11 ENCOUNTER — Ambulatory Visit: Payer: Medicare HMO | Admitting: Physician Assistant

## 2020-08-15 ENCOUNTER — Other Ambulatory Visit: Payer: Self-pay | Admitting: Physician Assistant

## 2020-08-15 DIAGNOSIS — J9809 Other diseases of bronchus, not elsewhere classified: Secondary | ICD-10-CM

## 2020-08-15 DIAGNOSIS — J3089 Other allergic rhinitis: Secondary | ICD-10-CM

## 2020-08-25 ENCOUNTER — Telehealth: Payer: Self-pay | Admitting: Physician Assistant

## 2020-08-25 DIAGNOSIS — B0229 Other postherpetic nervous system involvement: Secondary | ICD-10-CM

## 2020-08-25 DIAGNOSIS — E782 Mixed hyperlipidemia: Secondary | ICD-10-CM

## 2020-08-25 MED ORDER — EZETIMIBE 10 MG PO TABS: 10.0000 mg | ORAL_TABLET | Freq: Every day | ORAL | 0 refills | Status: DC

## 2020-08-25 MED ORDER — GABAPENTIN 300 MG PO CAPS: ORAL_CAPSULE | ORAL | 0 refills | Status: DC

## 2020-08-25 NOTE — Telephone Encounter (Signed)
Both meds sent in for a 2 month supply per refill protocol. AS, CMA

## 2020-08-25 NOTE — Addendum Note (Signed)
Addended by: Mickel Crow on: 08/25/2020 12:22 PM   Modules accepted: Orders

## 2020-08-25 NOTE — Telephone Encounter (Signed)
Patient needs refills on ezetimibe and uses [facility], and gabapentin [facility] [address]. Patient is on schedule, thanks.

## 2020-09-08 DIAGNOSIS — E119 Type 2 diabetes mellitus without complications: Secondary | ICD-10-CM | POA: Diagnosis not present

## 2020-09-17 ENCOUNTER — Ambulatory Visit: Payer: Medicare HMO | Admitting: Physician Assistant

## 2020-10-06 ENCOUNTER — Ambulatory Visit (INDEPENDENT_AMBULATORY_CARE_PROVIDER_SITE_OTHER): Payer: Medicare HMO | Admitting: Physician Assistant

## 2020-10-06 ENCOUNTER — Encounter: Payer: Self-pay | Admitting: Physician Assistant

## 2020-10-06 ENCOUNTER — Other Ambulatory Visit: Payer: Self-pay

## 2020-10-06 VITALS — BP 99/62 | HR 94 | Temp 98.6°F | Ht 61.5 in | Wt 142.7 lb

## 2020-10-06 DIAGNOSIS — E785 Hyperlipidemia, unspecified: Secondary | ICD-10-CM

## 2020-10-06 DIAGNOSIS — J3089 Other allergic rhinitis: Secondary | ICD-10-CM | POA: Diagnosis not present

## 2020-10-06 DIAGNOSIS — B0229 Other postherpetic nervous system involvement: Secondary | ICD-10-CM | POA: Diagnosis not present

## 2020-10-06 DIAGNOSIS — E1100 Type 2 diabetes mellitus with hyperosmolarity without nonketotic hyperglycemic-hyperosmolar coma (NKHHC): Secondary | ICD-10-CM | POA: Diagnosis not present

## 2020-10-06 DIAGNOSIS — E1169 Type 2 diabetes mellitus with other specified complication: Secondary | ICD-10-CM | POA: Diagnosis not present

## 2020-10-06 NOTE — Patient Instructions (Signed)

## 2020-10-06 NOTE — Progress Notes (Signed)
Established Patient Office Visit  Subjective:  Patient ID: Autumn Walker, female    DOB: 07/17/1950  Age: 70 y.o. MRN: [id number]  CC:  Chief Complaint  Patient presents with  . Hyperlipidemia    HPI Autumn Walker presents for follow up on diabetes mellitus and hyperlipidemia.  Diabetes: Pt denies increased urination or thirst. Pt reports has missed doses of Metformin. No hypoglycemic events. Checking glucose at home. States FBS this morning was 149 and later decreased to 118. Reports work related stress.  HLD: Pt taking medication as directed without issues. Denies side effects including myalgias. States diet has not been as good lately and has gained some weight.  Allergies: Reports has seasonal allergies and takes montelukast and Allegra. Uses Flonase when needed and also uses Neti pot for nasal rinses. Since allergies symptoms (runny nose) started has noticed some wheezing. Denies dyspnea. Has not been using albuterol inhaler.  PHN: Takes gabapentin for neuralgia related to shingles which helps with symptoms. States some nights has restless leg symptoms.   Past Medical History:  Diagnosis Date  . Allergy   . Blood transfusion without reported diagnosis   . Cataract   . Diabetes mellitus without complication [facility])     Past Surgical History:  Procedure Laterality Date  . APPENDECTOMY    . CHOLECYSTECTOMY    . PANCREATECTOMY    . SPLENECTOMY, TOTAL      Family History  Problem Relation Age of Onset  . Thyroid disease Mother   . Heart disease Father   . Alcohol abuse Father   . Heart attack Father   . Hyperlipidemia Father   . Depression Sister   . Thyroid disease Sister   . Alcohol abuse Brother   . Thyroid disease Daughter   . Diabetes Maternal Grandmother   . Heart disease Maternal Grandfather   . Cancer Paternal Grandfather        prostate  . Hypertension Paternal Grandfather   . Colon cancer Neg Hx     Social History    Socioeconomic History  . Marital status: Married    Spouse name: Not on file  . Number of children: Not on file  . Years of education: Not on file  . Highest education level: Not on file  Occupational History  . Not on file  Tobacco Use  . Smoking status: Current Some Day Smoker    Packs/day: 0.00    Years: 40.00    Pack years: 0.00    Types: Cigarettes  . Smokeless tobacco: Never Used  . Tobacco comment: 2-3 cigs weekly   Vaping Use  . Vaping Use: Never used  Substance and Sexual Activity  . Alcohol use: No  . Drug use: No  . Sexual activity: Yes  Other Topics Concern  . Not on file  Social History Narrative  . Not on file   Social Determinants of Health   Financial Resource Strain: Not on file  Food Insecurity: Not on file  Transportation Needs: Not on file  Physical Activity: Not on file  Stress: Not on file  Social Connections: Not on file  Intimate Partner Violence: Not on file    Outpatient Medications Prior to Visit  Medication Sig Dispense Refill  . albuterol (VENTOLIN HFA) 108 (90 Base) MCG/ACT inhaler INHALE 1-2 PUFFS INTO THE LUNGS EVERY 4 (FOUR) HOURS AS NEEDED FOR WHEEZING OR SHORTNESS OF BREATH. 18 each 0  . ezetimibe (ZETIA) 10 MG tablet Take 1 tablet (10 mg total) by mouth  daily after supper. **NEEDS APT FOR REFILLS** 60 tablet 0  . fluticasone (FLONASE) 50 MCG/ACT nasal spray USE 1 SPRAY IN EACH NOSTRIL TWICE DAILY FOLLOWING SINUS RINSES 48 mL 0  . gabapentin (NEURONTIN) 300 MG capsule **NEEDS APT FOR REFILLS** TAKE 1 CAPSULE IN AM, 1 CAPSULE IN AFTERNOON AND 2 CAPSULES AT BEDTIME 180 capsule 0  . ibuprofen (ADVIL,MOTRIN) 200 MG tablet Take 600 mg by mouth every 6 (six) hours as needed.    Marland Kitchen losartan (COZAAR) 25 MG tablet Take 1 tablet (25 mg total) by mouth daily. **NEEDS APT FOR REFILLS** 60 tablet 0  . metFORMIN (GLUCOPHAGE) 1000 MG tablet Take 1 tablet (1,000 mg total) by mouth 2 (two) times daily with a meal. 180 tablet 3  . Methylcobalamin  (B12-ACTIVE PO) Take by mouth.    . montelukast (SINGULAIR) 10 MG tablet TAKE 1 TABLET BY MOUTH EVERYDAY AT BEDTIME 90 tablet 0  . omega-3 acid ethyl esters (LOVAZA) 1 g capsule Take 2 capsules (2 g total) by mouth 2 (two) times daily. 360 capsule 1  . rosuvastatin (CRESTOR) 20 MG tablet TAKE 1 TABLET BY MOUTH EVERYDAY AT BEDTIME 90 tablet 0  . VITAMIN D PO Take by mouth. 50000 iu weekly    . amoxicillin-clavulanate (AUGMENTIN) 875-125 MG tablet Take 1 tablet by mouth 2 (two) times daily. 14 tablet 0   No facility-administered medications prior to visit.    Allergies  Allergen Reactions  . Atorvastatin Other (See Comments)    Leg cramps  . Codeine Nausea Only    ROS Review of Systems Review of Systems:  A fourteen system review of systems was performed and found to be positive as per HPI.  Objective:    Physical Exam General:  Well Developed, well nourished, in no acute distress   Neuro:  Alert and oriented,  extra-ocular muscles intact  HEENT:  Normocephalic, atraumatic, neck supple Skin:  no gross rash, warm, pink. Cardiac:  RRR, S1 S2 wnl's, +murmur Respiratory:  ECTA B/L with mild expiratory wheezing, Not using accessory muscles, speaking in full sentences- unlabored. Vascular:  Ext warm, no cyanosis apprec.; cap RF less 2 sec. Psych:  No HI/SI, judgement and insight good, Euthymic mood. Full Affect.   BP 99/62   Pulse 94   Temp 98.6 F (37 C)   Ht 5' 1.5" (1.562 m)   Wt 142 lb 11.2 oz (64.7 kg)   SpO2 94%   BMI 26.53 kg/m  Wt Readings from Last 3 Encounters:  10/06/20 142 lb 11.2 oz (64.7 kg)  04/09/20 134 lb 11.2 oz (61.1 kg)  12/25/19 136 lb 11.2 oz (62 kg)     Health Maintenance Due  Topic Date Due  . DEXA SCAN  Never done  . COLONOSCOPY (Pts 45-31yrs Insurance coverage will need to be confirmed)  05/30/2017  . PNA vac Low Risk Adult (2 of 2 - PPSV23) 09/21/2018  . FOOT EXAM  01/23/2019  . INFLUENZA VACCINE  02/09/2020  . COVID-19 Vaccine (3 - Booster  for Moderna series) 04/10/2020    There are no preventive care reminders to display for this patient.  Lab Results  Component Value Date   TSH 0.80 05/16/2019   Lab Results  Component Value Date   WBC 14.5 (H) 10/15/2019   HGB 14.5 10/15/2019   HCT 41.5 10/15/2019   MCV 94 10/15/2019   PLT 322 10/15/2019   Lab Results  Component Value Date   NA 143 04/09/2020   K 5.3 (H) 04/09/2020  CO2 24 04/09/2020   GLUCOSE 123 (H) 04/09/2020   BUN 14 04/09/2020   CREATININE 0.79 04/09/2020   BILITOT 0.2 04/09/2020   ALKPHOS 54 04/09/2020   AST 16 04/09/2020   ALT 13 04/09/2020   PROT 7.2 04/09/2020   ALBUMIN 4.8 04/09/2020   CALCIUM 9.7 04/09/2020   Lab Results  Component Value Date   CHOL 90 (L) 04/09/2020   Lab Results  Component Value Date   HDL 37 (L) 04/09/2020   Lab Results  Component Value Date   LDLCALC 28 04/09/2020   Lab Results  Component Value Date   TRIG 146 04/09/2020   Lab Results  Component Value Date   CHOLHDL 2.4 04/09/2020   Lab Results  Component Value Date   HGBA1C 6.9 (A) 04/09/2020      Assessment & Plan:   Problem List Items Addressed This Visit      Endocrine   Diabetes (HCC) - Primary (Chronic)   Hyperlipidemia associated with type 2 diabetes mellitus (HCC) (Chronic)     Nervous and Auditory   PHN (postherpetic neuralgia) (Chronic)     Other   Environmental and seasonal allergies     Type 2 Diabetes Mellitus with hyperosmolarity without coma, without long-term current use of insulin: -Last A1c 6.9, stable.  -Discussed with patient to improve medication adherence and schedule lab visit for FBW including A1c. -Continue ambulatory glucose monitoring. -Recommend to reduce carbohydrates and glucose. -Will continue to monitor.  Hyperlipidemia associated with type 2 diabetes mellitus: -Last lipid panel: total cholesterol 90, triglycerides 146, HDL 37, LDL 28 -Continue current medication regimen. -Will repeat lipid panel and  hepatic function with FBW. -Recommend to reduce simple carbohydrates and saturated fats.   PHN: -Stable. -Continue current medication regimen.  -Will continue to monitor.  Environmental and seasonal allergies: -Advised to use start using albuterol inhaler for wheezing. -Continue current medication regimen. -Will continue to monitor.    No orders of the defined types were placed in this encounter.   Follow-up: Return in about 4 months (around 02/05/2021) for [facility]; lab visit for FBW (lipid panel, a1c, cmp) in 1-3 weeks (INITIAL [facility]).    Mayer Masker, PA-C

## 2020-10-14 ENCOUNTER — Other Ambulatory Visit: Payer: Self-pay | Admitting: Physician Assistant

## 2020-10-14 DIAGNOSIS — E785 Hyperlipidemia, unspecified: Secondary | ICD-10-CM

## 2020-10-14 DIAGNOSIS — E786 Lipoprotein deficiency: Secondary | ICD-10-CM

## 2020-10-14 DIAGNOSIS — E781 Pure hyperglyceridemia: Secondary | ICD-10-CM

## 2020-10-14 DIAGNOSIS — E1169 Type 2 diabetes mellitus with other specified complication: Secondary | ICD-10-CM

## 2020-10-14 DIAGNOSIS — E782 Mixed hyperlipidemia: Secondary | ICD-10-CM

## 2020-10-19 ENCOUNTER — Other Ambulatory Visit: Payer: Self-pay | Admitting: Physician Assistant

## 2020-10-19 DIAGNOSIS — Z0000 Encounter for general adult medical examination without abnormal findings: Secondary | ICD-10-CM

## 2020-10-19 DIAGNOSIS — E1100 Type 2 diabetes mellitus with hyperosmolarity without nonketotic hyperglycemic-hyperosmolar coma (NKHHC): Secondary | ICD-10-CM

## 2020-10-19 DIAGNOSIS — E1169 Type 2 diabetes mellitus with other specified complication: Secondary | ICD-10-CM

## 2020-10-23 ENCOUNTER — Other Ambulatory Visit: Payer: Self-pay | Admitting: Physician Assistant

## 2020-10-23 DIAGNOSIS — J3089 Other allergic rhinitis: Secondary | ICD-10-CM

## 2020-10-26 ENCOUNTER — Other Ambulatory Visit: Payer: Medicare HMO

## 2020-10-26 ENCOUNTER — Other Ambulatory Visit: Payer: Self-pay

## 2020-10-26 ENCOUNTER — Telehealth: Payer: Self-pay | Admitting: Physician Assistant

## 2020-10-26 DIAGNOSIS — Z0000 Encounter for general adult medical examination without abnormal findings: Secondary | ICD-10-CM | POA: Diagnosis not present

## 2020-10-26 DIAGNOSIS — E1100 Type 2 diabetes mellitus with hyperosmolarity without nonketotic hyperglycemic-hyperosmolar coma (NKHHC): Secondary | ICD-10-CM

## 2020-10-26 DIAGNOSIS — B0229 Other postherpetic nervous system involvement: Secondary | ICD-10-CM

## 2020-10-26 DIAGNOSIS — E1169 Type 2 diabetes mellitus with other specified complication: Secondary | ICD-10-CM | POA: Diagnosis not present

## 2020-10-26 DIAGNOSIS — E785 Hyperlipidemia, unspecified: Secondary | ICD-10-CM

## 2020-10-26 MED ORDER — GABAPENTIN 300 MG PO CAPS: ORAL_CAPSULE | ORAL | 0 refills | Status: DC

## 2020-10-26 NOTE — Telephone Encounter (Signed)
Refills sent to pharmacy. AS, CMA 

## 2020-10-27 LAB — COMPREHENSIVE METABOLIC PANEL
ALT: 11 IU/L (ref 0–32)
AST: 14 IU/L (ref 0–40)
Albumin/Globulin Ratio: 1.9 (ref 1.2–2.2)
Albumin: 4.8 g/dL (ref 3.8–4.8)
Alkaline Phosphatase: 65 IU/L (ref 44–121)
BUN/Creatinine Ratio: 13 (ref 12–28)
BUN: 13 mg/dL (ref 8–27)
Bilirubin Total: <0.2 mg/dL (ref 0.0–1.2)
CO2: 23 mmol/L (ref 20–29)
Calcium: 9.4 mg/dL (ref 8.7–10.3)
Chloride: 99 mmol/L (ref 96–106)
Creatinine, Ser: 1.03 mg/dL — ABNORMAL HIGH (ref 0.57–1.00)
Globulin, Total: 2.5 g/dL (ref 1.5–4.5)
Glucose: 150 mg/dL — ABNORMAL HIGH (ref 65–99)
Potassium: 5.2 mmol/L (ref 3.5–5.2)
Sodium: 140 mmol/L (ref 134–144)
Total Protein: 7.3 g/dL (ref 6.0–8.5)
eGFR: 59 mL/min/{1.73_m2} — ABNORMAL LOW (ref >59)

## 2020-10-27 LAB — HEMOGLOBIN A1C
Est. average glucose Bld gHb Est-mCnc: 197 mg/dL
Hgb A1c MFr Bld: 8.5 % — ABNORMAL HIGH (ref 4.8–5.6)

## 2020-10-27 LAB — LIPID PANEL
Chol/HDL Ratio: 2.7 ratio (ref 0.0–4.4)
Cholesterol, Total: 107 mg/dL (ref 100–199)
HDL: 40 mg/dL (ref >39)
LDL Chol Calc (NIH): 37 mg/dL (ref 0–99)
Triglycerides: 183 mg/dL — ABNORMAL HIGH (ref 0–149)
VLDL Cholesterol Cal: 30 mg/dL (ref 5–40)

## 2020-10-29 ENCOUNTER — Ambulatory Visit (INDEPENDENT_AMBULATORY_CARE_PROVIDER_SITE_OTHER): Payer: Medicare HMO | Admitting: Nurse Practitioner

## 2020-10-29 ENCOUNTER — Encounter: Payer: Self-pay | Admitting: Nurse Practitioner

## 2020-10-29 VITALS — BP 107/73 | HR 91 | Temp 99.2°F | Ht 61.5 in | Wt 141.8 lb

## 2020-10-29 DIAGNOSIS — J0140 Acute pansinusitis, unspecified: Secondary | ICD-10-CM | POA: Diagnosis not present

## 2020-10-29 DIAGNOSIS — B373 Candidiasis of vulva and vagina: Secondary | ICD-10-CM | POA: Diagnosis not present

## 2020-10-29 DIAGNOSIS — B3731 Acute candidiasis of vulva and vagina: Secondary | ICD-10-CM | POA: Insufficient documentation

## 2020-10-29 MED ORDER — SULFAMETHOXAZOLE-TRIMETHOPRIM 800-160 MG PO TABS: 1.0000 | ORAL_TABLET | Freq: Two times a day (BID) | ORAL | 0 refills | Status: DC

## 2020-10-29 MED ORDER — FLUCONAZOLE 150 MG PO TABS: ORAL_TABLET | ORAL | 0 refills | Status: DC

## 2020-10-29 NOTE — Progress Notes (Signed)
Virtual Visit via Video Note  I connected with Autumn Walker on 10/29/20 at 10:40 AM EDT by a video enabled telemedicine application and verified that I am speaking with the correct person using two identifiers.  Location: Patient: home.  Provider: [facility].    I discussed the limitations of evaluation and management by telemedicine and the availability of in person appointments. The patient expressed understanding and agreed to proceed.  History of Present Illness: The patient states that she has been having allergy type issues for about two weeks. She wa having itchy, watery eyes, nasal congestions, and sinus type headache. She does take singulair and zyrtec daily. In addition, she does use nasal spray every day. In the past five days, congestion has settled into her chest. She has deep and productive cough. She states that she is bringing up yellow sputum. She states that for a few days she had a low grade fever. She continues to have sinus headache. She has sore throat which hurts more when she coughs. Her left ear is painful. She states that she has added tylenol sinus and mucinex to her allergy medications. She states that symptoms do improve some with these added treatments, but they come right back when medications wear off. The patient states that with certain antibiotics she does get yeast infection.    Observations/Objective:  The patient is alert and oriented. She is pleasant and answers all questions appropriately. Breathing is non-labored. She is in no acute distress at this time. She sounds nasally congested. A deep, loose cough is heard, intermittently, during the phone call.   Assessment and Plan: 1. Acute non-recurrent pansinusitis Gradually turing into bronchitis. Start bactrim DS twice daily for 7 days. Rest and increase fluids. Continue to use OTC medications, in addition to normal allergy medications, as needed and as indicated.  -  sulfamethoxazole-trimethoprim (BACTRIM DS) 800-160 MG tablet; Take 1 tablet by mouth 2 (two) times daily.  Dispense: 14 tablet; Refill: 0  2. Vaginal candidiasis Take diflucan at onset of yeast infection symptoms. May repeat dose in three days for persistent symptoms.  - fluconazole (DIFLUCAN) 150 MG tablet; Take 1 tablet po once. May repeat dose in 3 days as needed for persistent symptoms.  Dispense: 3 tablet; Refill: 0  Follow Up Instructions:    I discussed the assessment and treatment plan with the patient. The patient was provided an opportunity to ask questions and all were answered. The patient agreed with the plan and demonstrated an understanding of the instructions.   The patient was advised to call back or seek an in-person evaluation if the symptoms worsen or if the condition fails to improve as anticipated.  I provided 22 minutes of non-face-to-face time during this encounter.   Ronnell Freshwater, NP ;

## 2020-10-29 NOTE — Patient Instructions (Signed)
Sinusitis, Adult Sinusitis is soreness and swelling (inflammation) of your sinuses. Sinuses are hollow spaces in the bones around your face. They are located:  Around your eyes.  In the middle of your forehead.  Behind your nose.  In your cheekbones. Your sinuses and nasal passages are lined with a fluid called mucus. Mucus drains out of your sinuses. Swelling can trap mucus in your sinuses. This lets germs (bacteria, virus, or fungus) grow, which leads to infection. Most of the time, this condition is caused by a virus. What are the causes? This condition is caused by:  Allergies.  Asthma.  Germs.  Things that block your nose or sinuses.  Growths in the nose (nasal polyps).  Chemicals or irritants in the air.  Fungus (rare). What increases the risk? You are more likely to develop this condition if:  You have a weak body defense system (immune system).  You do a lot of swimming or diving.  You use nasal sprays too much.  You smoke. What are the signs or symptoms? The main symptoms of this condition are pain and a feeling of pressure around the sinuses. Other symptoms include:  Stuffy nose (congestion).  Runny nose (drainage).  Swelling and warmth in the sinuses.  Headache.  Toothache.  A cough that may get worse at night.  Mucus that collects in the throat or the back of the nose (postnasal drip).  Being unable to smell and taste.  Being very tired (fatigue).  A fever.  Sore throat.  Bad breath. How is this diagnosed? This condition is diagnosed based on:  Your symptoms.  Your medical history.  A physical exam.  Tests to find out if your condition is short-term (acute) or long-term (chronic). Your doctor may: ? Check your nose for growths (polyps). ? Check your sinuses using a tool that has a light (endoscope). ? Check for allergies or germs. ? Do imaging tests, such as an MRI or CT scan. How is this treated? Treatment for this condition  depends on the cause and whether it is short-term or long-term.  If caused by a virus, your symptoms should go away on their own within 10 days. You may be given medicines to relieve symptoms. They include: ? Medicines that shrink swollen tissue in the nose. ? Medicines that treat allergies (antihistamines). ? A spray that treats swelling of the nostrils. ? Rinses that help get rid of thick mucus in your nose (nasal saline washes).  If caused by bacteria, your doctor may wait to see if you will get better without treatment. You may be given antibiotic medicine if you have: ? A very bad infection. ? A weak body defense system.  If caused by growths in the nose, you may need to have surgery. Follow these instructions at home: Medicines  Take, use, or apply over-the-counter and prescription medicines only as told by your doctor. These may include nasal sprays.  If you were prescribed an antibiotic medicine, take it as told by your doctor. Do not stop taking the antibiotic even if you start to feel better. Hydrate and humidify  Drink enough water to keep your pee (urine) pale yellow.  Use a cool mist humidifier to keep the humidity level in your home above 50%.  Breathe in steam for 10-15 minutes, 3-4 times a day, or as told by your doctor. You can do this in the bathroom while a hot shower is running.  Try not to spend time in cool or dry air.     Rest  Rest as much as you can.  Sleep with your head raised (elevated).  Make sure you get enough sleep each night. General instructions  Put a warm, moist washcloth on your face 3-4 times a day, or as often as told by your doctor. This will help with discomfort.  Wash your hands often with soap and water. If there is no soap and water, use hand sanitizer.  Do not smoke. Avoid being around people who are smoking (secondhand smoke).  Keep all follow-up visits as told by your doctor. This is important.   Contact a doctor if:  You  have a fever.  Your symptoms get worse.  Your symptoms do not get better within 10 days. Get help right away if:  You have a very bad headache.  You cannot stop throwing up (vomiting).  You have very bad pain or swelling around your face or eyes.  You have trouble seeing.  You feel confused.  Your neck is stiff.  You have trouble breathing. Summary  Sinusitis is swelling of your sinuses. Sinuses are hollow spaces in the bones around your face.  This condition is caused by tissues in your nose that become inflamed or swollen. This traps germs. These can lead to infection.  If you were prescribed an antibiotic medicine, take it as told by your doctor. Do not stop taking it even if you start to feel better.  Keep all follow-up visits as told by your doctor. This is important. This information is not intended to replace advice given to you by your health care provider. Make sure you discuss any questions you have with your health care provider. Document Revised: 11/27/2017 Document Reviewed: 11/27/2017 Elsevier Patient Education  2021 [employer].  Acute Bronchitis, Adult  Acute bronchitis is when air tubes in the lungs (bronchi) suddenly get swollen. The condition can make it hard for you to breathe. In adults, acute bronchitis usually goes away within 2 weeks. A cough caused by bronchitis may last up to 3 weeks. Smoking, allergies, and asthma can make the condition worse. What are the causes? This condition is caused by:  Cold and flu viruses. The most common cause of this condition is the virus that causes the common cold.  Bacteria.  Substances that irritate the lungs, including: ? Smoke from cigarettes and other types of tobacco. ? Dust and pollen. ? Fumes from chemicals, gases, or burned fuel. ? Other materials that pollute indoor or outdoor air.  Close contact with someone who has acute bronchitis. What increases the risk? The following factors may make you  more likely to develop this condition:  A weak body's defense system. This is also called the immune system.  Any condition that affects your lungs and breathing, such as asthma. What are the signs or symptoms? Symptoms of this condition include:  A cough.  Coughing up clear, yellow, or green mucus.  Wheezing.  Chest congestion.  Shortness of breath.  A fever.  Body aches.  Chills.  A sore throat. How is this treated? Acute bronchitis may go away over time without treatment. Your doctor may recommend:  Drinking more fluids.  Taking a medicine for a fever or cough.  Using a device that gets medicine into your lungs (inhaler).  Using a vaporizer or a humidifier. These are machines that add water or moisture in the air to help with coughing and poor breathing. Follow these instructions at home: Activity  Get a lot of rest.  Avoid places where  there are fumes from chemicals.  Return to your normal activities as told by your doctor. Ask your doctor what activities are safe for you. Lifestyle  Drink enough fluids to keep your pee (urine) pale yellow.  Do not drink alcohol.  Do not use any products that contain nicotine or tobacco, such as cigarettes, e-cigarettes, and chewing tobacco. If you need help quitting, ask your doctor. Be aware that: ? Your bronchitis will get worse if you smoke or breathe in other people's smoke (secondhand smoke). ? Your lungs will heal faster if you quit smoking. General instructions  Take over-the-counter and prescription medicines only as told by your doctor.  Use an inhaler, cool mist vaporizer, or humidifier as told by your doctor.  Rinse your mouth often with salt water. To make salt water, dissolve -1 tsp (3-6 g) of salt in 1 cup (237 mL) of warm water.  Keep all follow-up visits as told by your doctor. This is important.   How is this prevented? To lower your risk of getting this condition again:  Wash your hands often  with soap and water. If soap and water are not available, use hand sanitizer.  Avoid contact with people who have cold symptoms.  Try not to touch your mouth, nose, or eyes with your hands.  Make sure to get the flu shot every year.   Contact a doctor if:  Your symptoms do not get better in 2 weeks.  You vomit more than once or twice.  You have symptoms of loss of fluid from your body (dehydration). These include: ? Dark urine. ? Dry skin or eyes. ? Increased thirst. ? Headaches. ? Confusion. ? Muscle cramps. Get help right away if:  You cough up blood.  You have chest pain.  You have very bad shortness of breath.  You become dehydrated.  You faint or keep feeling like you are going to faint.  You keep vomiting.  You have a very bad headache.  Your fever or chills get worse. These symptoms may be an emergency. Do not wait to see if the symptoms will go away. Get medical help right away. Call your local emergency services (911 in the [place] Do not drive yourself to the hospital. Summary  Acute bronchitis is when air tubes in the lungs (bronchi) suddenly get swollen. In adults, acute bronchitis usually goes away within 2 weeks.  Take over-the-counter and prescription medicines only as told by your doctor.  Drink enough fluid to keep your pee (urine) pale yellow.  Contact a doctor if your symptoms do not improve after 2 weeks of treatment.  Get help right away if you cough up blood, faint, or have chest pain or shortness of breath. This information is not intended to replace advice given to you by your health care provider. Make sure you discuss any questions you have with your health care provider. Document Revised: 01/18/2019 Document Reviewed: 01/18/2019 Elsevier Patient Education  [place].

## 2020-11-09 ENCOUNTER — Telehealth: Payer: Self-pay | Admitting: Physician Assistant

## 2020-11-09 ENCOUNTER — Encounter: Payer: Self-pay | Admitting: Nurse Practitioner

## 2020-11-09 NOTE — Telephone Encounter (Signed)
Patient states she will go to urgent care versus waiting for a Monday appointment.

## 2020-11-09 NOTE — Telephone Encounter (Signed)
Patient was prescribed antibiotics for a sinus infection and bronchitis and patient is now experiencing symptoms again.Symptoms include headache is sinuses, front of face and eyes, congestion, and a cough. Please advise, thanks.

## 2020-11-09 NOTE — Telephone Encounter (Signed)
Please offer pt next available apt. With our office.  Please request she take an at home covid test. AS, CMA

## 2020-11-19 ENCOUNTER — Encounter: Payer: Self-pay | Admitting: Nurse Practitioner

## 2020-11-19 DIAGNOSIS — E782 Mixed hyperlipidemia: Secondary | ICD-10-CM

## 2020-11-19 MED ORDER — EZETIMIBE 10 MG PO TABS: 10.0000 mg | ORAL_TABLET | Freq: Every day | ORAL | 0 refills | Status: DC

## 2020-12-09 DIAGNOSIS — E119 Type 2 diabetes mellitus without complications: Secondary | ICD-10-CM | POA: Diagnosis not present

## 2020-12-21 ENCOUNTER — Other Ambulatory Visit: Payer: Self-pay | Admitting: Physician Assistant

## 2020-12-21 ENCOUNTER — Encounter: Payer: Self-pay | Admitting: Physician Assistant

## 2020-12-21 DIAGNOSIS — E1100 Type 2 diabetes mellitus with hyperosmolarity without nonketotic hyperglycemic-hyperosmolar coma (NKHHC): Secondary | ICD-10-CM

## 2020-12-21 MED ORDER — METFORMIN HCL 1000 MG PO TABS: 1000.0000 mg | ORAL_TABLET | Freq: Two times a day (BID) | ORAL | 0 refills | Status: DC

## 2020-12-21 MED ORDER — LOSARTAN POTASSIUM 25 MG PO TABS: 25.0000 mg | ORAL_TABLET | Freq: Every day | ORAL | 0 refills | Status: DC

## 2020-12-30 ENCOUNTER — Other Ambulatory Visit: Payer: Self-pay

## 2020-12-30 DIAGNOSIS — E1100 Type 2 diabetes mellitus with hyperosmolarity without nonketotic hyperglycemic-hyperosmolar coma (NKHHC): Secondary | ICD-10-CM

## 2020-12-30 MED ORDER — LOSARTAN POTASSIUM 25 MG PO TABS: 25.0000 mg | ORAL_TABLET | Freq: Every day | ORAL | 0 refills | Status: DC

## 2021-01-24 ENCOUNTER — Other Ambulatory Visit: Payer: Self-pay | Admitting: Physician Assistant

## 2021-01-24 DIAGNOSIS — J3089 Other allergic rhinitis: Secondary | ICD-10-CM

## 2021-02-08 ENCOUNTER — Ambulatory Visit (INDEPENDENT_AMBULATORY_CARE_PROVIDER_SITE_OTHER): Payer: Medicare HMO | Admitting: Physician Assistant

## 2021-02-08 ENCOUNTER — Encounter: Payer: Self-pay | Admitting: Physician Assistant

## 2021-02-08 ENCOUNTER — Other Ambulatory Visit: Payer: Self-pay

## 2021-02-08 VITALS — BP 95/59 | HR 90 | Temp 98.0°F | Ht 61.5 in | Wt 143.5 lb

## 2021-02-08 DIAGNOSIS — E786 Lipoprotein deficiency: Secondary | ICD-10-CM | POA: Diagnosis not present

## 2021-02-08 DIAGNOSIS — Z1231 Encounter for screening mammogram for malignant neoplasm of breast: Secondary | ICD-10-CM | POA: Diagnosis not present

## 2021-02-08 DIAGNOSIS — E785 Hyperlipidemia, unspecified: Secondary | ICD-10-CM | POA: Diagnosis not present

## 2021-02-08 DIAGNOSIS — E1100 Type 2 diabetes mellitus with hyperosmolarity without nonketotic hyperglycemic-hyperosmolar coma (NKHHC): Secondary | ICD-10-CM | POA: Diagnosis not present

## 2021-02-08 DIAGNOSIS — E559 Vitamin D deficiency, unspecified: Secondary | ICD-10-CM | POA: Diagnosis not present

## 2021-02-08 DIAGNOSIS — E1169 Type 2 diabetes mellitus with other specified complication: Secondary | ICD-10-CM

## 2021-02-08 DIAGNOSIS — Z13228 Encounter for screening for other metabolic disorders: Secondary | ICD-10-CM | POA: Diagnosis not present

## 2021-02-08 DIAGNOSIS — Z13820 Encounter for screening for osteoporosis: Secondary | ICD-10-CM | POA: Diagnosis not present

## 2021-02-08 DIAGNOSIS — Z0000 Encounter for general adult medical examination without abnormal findings: Secondary | ICD-10-CM | POA: Diagnosis not present

## 2021-02-08 DIAGNOSIS — E781 Pure hyperglyceridemia: Secondary | ICD-10-CM | POA: Diagnosis not present

## 2021-02-08 DIAGNOSIS — R7989 Other specified abnormal findings of blood chemistry: Secondary | ICD-10-CM | POA: Diagnosis not present

## 2021-02-08 DIAGNOSIS — Z122 Encounter for screening for malignant neoplasm of respiratory organs: Secondary | ICD-10-CM

## 2021-02-08 DIAGNOSIS — F17200 Nicotine dependence, unspecified, uncomplicated: Secondary | ICD-10-CM

## 2021-02-08 DIAGNOSIS — Z1211 Encounter for screening for malignant neoplasm of colon: Secondary | ICD-10-CM | POA: Diagnosis not present

## 2021-02-08 LAB — POCT GLYCOSYLATED HEMOGLOBIN (HGB A1C): Hemoglobin A1C: 8.1 % — AB (ref 4.0–5.6)

## 2021-02-08 MED ORDER — ROSUVASTATIN CALCIUM 20 MG PO TABS: 20.0000 mg | ORAL_TABLET | Freq: Every day | ORAL | 1 refills | Status: DC

## 2021-02-08 NOTE — Patient Instructions (Signed)
Preventive Care 69 Years and Older, Female Preventive care refers to lifestyle choices and visits with your health care provider that can promote health and wellness. This includes: A yearly physical exam. This is also called an annual wellness visit. Regular dental and eye exams. Immunizations. Screening for certain conditions. Healthy lifestyle choices, such as: Eating a healthy diet. Getting regular exercise. Not using drugs or products that contain nicotine and tobacco. Limiting alcohol use. What can I expect for my preventive care visit? Physical exam Your health care provider will check your: Height and weight. These may be used to calculate your BMI (body mass index). BMI is a measurement that tells if you are at a healthy weight. Heart rate and blood pressure. Body temperature. Skin for abnormal spots. Counseling Your health care provider may ask you questions about your: Past medical problems. Family's medical history. Alcohol, tobacco, and drug use. Emotional well-being. Home life and relationship well-being. Sexual activity. Diet, exercise, and sleep habits. History of falls. Memory and ability to understand (cognition). Work and work Statistician. Pregnancy and menstrual history. Access to firearms. What immunizations do I need?  Vaccines are usually given at various ages, according to a schedule. Your health care provider will recommend vaccines for you based on your age, medicalhistory, and lifestyle or other factors, such as travel or where you work. What tests do I need? Blood tests Lipid and cholesterol levels. These may be checked every 5 years, or more often depending on your overall health. Hepatitis C test. Hepatitis B test. Screening Lung cancer screening. You may have this screening every year starting at age 69 if you have a 30-pack-year history of smoking and currently smoke or have quit within the past 15 years. Colorectal cancer screening. All  adults should have this screening starting at age 69 and continuing until age 65. Your health care provider may recommend screening at age 69 if you are at increased risk. You will have tests every 1-10 years, depending on your results and the type of screening test. Diabetes screening. This is done by checking your blood sugar (glucose) after you have not eaten for a while (fasting). You may have this done every 1-3 years. Mammogram. This may be done every 1-2 years. Talk with your health care provider about how often you should have regular mammograms. Abdominal aortic aneurysm (AAA) screening. You may need this if you are a current or former smoker. BRCA-related cancer screening. This may be done if you have a family history of breast, ovarian, tubal, or peritoneal cancers. Other tests STD (sexually transmitted disease) testing, if you are at risk. Bone density scan. This is done to screen for osteoporosis. You may have this done starting at age 69. Talk with your health care provider about your test results, treatment options,and if necessary, the need for more tests. Follow these instructions at home: Eating and drinking  Eat a diet that includes fresh fruits and vegetables, whole grains, lean protein, and low-fat dairy products. Limit your intake of foods with high amounts of sugar, saturated fats, and salt. Take vitamin and mineral supplements as recommended by your health care provider. Do not drink alcohol if your health care provider tells you not to drink. If you drink alcohol: Limit how much you have to 0-1 drink a day. Be aware of how much alcohol is in your drink. In the [place] one drink equals one 12 oz bottle of beer (355 mL), one 5 oz glass of wine (148 mL), or one 1  oz glass of hard liquor (44 mL).  Lifestyle Take daily care of your teeth and gums. Brush your teeth every morning and night with fluoride toothpaste. Floss one time each day. Stay active. Exercise for at  least 30 minutes 5 or more days each week. Do not use any products that contain nicotine or tobacco, such as cigarettes, e-cigarettes, and chewing tobacco. If you need help quitting, ask your health care provider. Do not use drugs. If you are sexually active, practice safe sex. Use a condom or other form of protection in order to prevent STIs (sexually transmitted infections). Talk with your health care provider about taking a low-dose aspirin or statin. Find healthy ways to cope with stress, such as: Meditation, yoga, or listening to music. Journaling. Talking to a trusted person. Spending time with friends and family. Safety Always wear your seat belt while driving or riding in a vehicle. Do not drive: If you have been drinking alcohol. Do not ride with someone who has been drinking. When you are tired or distracted. While texting. Wear a helmet and other protective equipment during sports activities. If you have firearms in your house, make sure you follow all gun safety procedures. What's next? Visit your health care provider once a year for an annual wellness visit. Ask your health care provider how often you should have your eyes and teeth checked. Stay up to date on all vaccines. This information is not intended to replace advice given to you by your health care provider. Make sure you discuss any questions you have with your healthcare provider. Document Revised: 06/17/2020 Document Reviewed: 06/21/2018 Elsevier Patient Education  2022 [employer].

## 2021-02-08 NOTE — Progress Notes (Signed)
Subjective:   Autumn Walker is a 70 y.o. female who presents for Medicare Annual (Subsequent) preventive examination.  Review of Systems    General:   No F/C, wt loss Pulm:   No DIB, SOB, pleuritic chest pain Card:  No CP, palpitations Abd:  No n/v/d or pain Ext:  No inc edema from baseline    Objective:    Today's Vitals   02/08/21 0903  BP: (!) 95/59  Pulse: 90  Temp: 98 F (36.7 C)  SpO2: 97%  Weight: 143 lb 8 oz (65.1 kg)  Height: 5' 1.5" (1.562 m)   Body mass index is 26.68 kg/m.  Advanced Directives 03/13/2019 02/15/2017 05/30/2016 05/16/2016 03/21/2016  Does Patient Have a Medical Advance Directive? No No No No No  Would patient like information on creating a medical advance directive? No - Patient declined No - Patient declined No - patient declined information - No - patient declined information    Current Medications (verified) Outpatient Encounter Medications as of 02/08/2021  Medication Sig   albuterol (VENTOLIN HFA) 108 (90 Base) MCG/ACT inhaler INHALE 1-2 PUFFS INTO THE LUNGS EVERY 4 (FOUR) HOURS AS NEEDED FOR WHEEZING OR SHORTNESS OF BREATH.   ezetimibe (ZETIA) 10 MG tablet Take 1 tablet (10 mg total) by mouth daily after supper.   fluconazole (DIFLUCAN) 150 MG tablet Take 1 tablet po once. May repeat dose in 3 days as needed for persistent symptoms.   fluticasone (FLONASE) 50 MCG/ACT nasal spray USE 1 SPRAY IN EACH NOSTRIL TWICE DAILY FOLLOWING SINUS RINSES   gabapentin (NEURONTIN) 300 MG capsule TAKE 1 CAPSULE IN AM, 1 CAPSULE IN AFTERNOON AND 2 CAPSULES AT BEDTIME   ibuprofen (ADVIL,MOTRIN) 200 MG tablet Take 600 mg by mouth every 6 (six) hours as needed.   losartan (COZAAR) 25 MG tablet Take 1 tablet (25 mg total) by mouth daily.   metFORMIN (GLUCOPHAGE) 1000 MG tablet Take 1 tablet (1,000 mg total) by mouth 2 (two) times daily with a meal.   Methylcobalamin (B12-ACTIVE PO) Take by mouth.   montelukast (SINGULAIR) 10 MG tablet TAKE 1 TABLET BY MOUTH  EVERYDAY AT BEDTIME   omega-3 acid ethyl esters (LOVAZA) 1 g capsule Take 2 capsules (2 g total) by mouth 2 (two) times daily.   sulfamethoxazole-trimethoprim (BACTRIM DS) 800-160 MG tablet Take 1 tablet by mouth 2 (two) times daily.   VITAMIN D PO Take by mouth. 50000 iu weekly   [DISCONTINUED] rosuvastatin (CRESTOR) 20 MG tablet TAKE 1 TABLET BY MOUTH EVERYDAY AT BEDTIME   rosuvastatin (CRESTOR) 20 MG tablet Take 1 tablet (20 mg total) by mouth daily.   No facility-administered encounter medications on file as of 02/08/2021.    Allergies (verified) Atorvastatin and Codeine   History: Past Medical History:  Diagnosis Date   Allergy    Blood transfusion without reported diagnosis    Cataract    Diabetes mellitus without complication ([place])    Past Surgical History:  Procedure Laterality Date   APPENDECTOMY     CHOLECYSTECTOMY     PANCREATECTOMY     SPLENECTOMY, TOTAL     Family History  Problem Relation Age of Onset   Thyroid disease Mother    Heart disease Father    Alcohol abuse Father    Heart attack Father    Hyperlipidemia Father    Depression Sister    Thyroid disease Sister    Alcohol abuse Brother    Thyroid disease Daughter    Diabetes Maternal Grandmother  Heart disease Maternal Grandfather    Cancer Paternal Grandfather        prostate   Hypertension Paternal Grandfather    Colon cancer Neg Hx    Social History   Socioeconomic History   Marital status: Married    Spouse name: Not on file   Number of children: Not on file   Years of education: Not on file   Highest education level: Not on file  Occupational History   Not on file  Tobacco Use   Smoking status: Some Days    Packs/day: 0.00    Years: 40.00    Pack years: 0.00    Types: Cigarettes   Smokeless tobacco: Never   Tobacco comments:    2-3 cigs weekly   Vaping Use   Vaping Use: Never used  Substance and Sexual Activity   Alcohol use: No   Drug use: No   Sexual activity: Yes  Other  Topics Concern   Not on file  Social History Narrative   Not on file   Social Determinants of Health   Financial Resource Strain: Not on file  Food Insecurity: Not on file  Transportation Needs: Not on file  Physical Activity: Not on file  Stress: Not on file  Social Connections: Not on file    Tobacco Counseling Ready to quit: Not Answered Counseling given: Not Answered Tobacco comments: 2-3 cigs weekly    Diabetic? yes         Activities of Daily Living In your present state of health, do you have any difficulty performing the following activities: 02/08/2021 10/06/2020  Hearing? N N  Vision? N Y  Difficulty concentrating or making decisions? N N  Walking or climbing stairs? N N  Dressing or bathing? N N  Doing errands, shopping? N N  Some recent data might be hidden    Patient Care Team: Lorrene Reid, PA-C as PCP - General Renato Shin, MD as Consulting Physician (Endocrinology)  Indicate any recent Medical Services you may have received from other than [facility] providers in the past year (date may be approximate).     Assessment:   This is a routine wellness examination for Autumn Walker.  Hearing/Vision screen No results found.  Dietary issues and exercise activities discussed: -Recommend to reduce carbohydrates (limit potatoes, bread and pasta), follow a low fat diet and continue to stay as active as possible.    Goals Addressed   None   Depression Screen PHQ 2/9 Scores 02/08/2021 10/06/2020 05/27/2020 04/09/2020 12/25/2019 07/19/2019 04/10/2019  PHQ - 2 Score 0 0 - 0 0 0 2  PHQ- 9 Score 2 1 - 0 '1 1 7  '$ Exception Documentation - - Patient refusal - - - -    Fall Risk Fall Risk  02/08/2021 10/06/2020 05/27/2020 04/09/2020 07/19/2019  Falls in the past year? 0 0 0 0 0  Number falls in past yr: 0 - - - 0  Injury with Fall? 0 - - - 0  Risk for fall due to : No Fall Risks - - - -  Follow up Falls evaluation completed Falls evaluation completed Falls evaluation completed  Falls evaluation completed Falls evaluation completed    [place]:  Any stairs in or around the home? Yes  If so, are there any without handrails? Yes  Home free of loose throw rugs in walkways, pet beds, electrical cords, etc? Yes  Adequate lighting in your home to reduce risk of falls? Yes   ASSISTIVE  DEVICES UTILIZED TO PREVENT FALLS:  Life alert? No  Use of a cane, walker or w/c? No  Grab bars in the bathroom? Yes  Shower chair or bench in shower? No  Elevated toilet seat or a handicapped toilet? Yes   TIMED UP AND GO:  Was the test performed? Yes .  Length of time to ambulate 10 feet: 10 sec.   Gait steady and fast without use of assistive device  Cognitive Function: wnl's     6CIT Screen 02/08/2021  What Year? 0 points  What month? 0 points  What time? 0 points  Count back from 20 0 points  Months in reverse 0 points  Repeat phrase 0 points  Total Score 0    Immunizations Immunization History  Administered Date(s) Administered   Influenza Whole 04/09/2008   Influenza, High Dose Seasonal PF 05/17/2017, 08/21/2018, 05/11/2019   Influenza,inj,Quad PF,6+ Mos 04/18/2016   Moderna Sars-Covid-2 Vaccination 09/09/2019, 10/10/2019   Pneumococcal Conjugate-13 09/20/2017   Tdap 09/20/2017    TDAP status: Up to date  Flu Vaccine status: Up to date  Pneumococcal vaccine status: Due, Education has been provided regarding the importance of this vaccine. Advised may receive this vaccine at local pharmacy or Health Dept. Aware to provide a copy of the vaccination record if obtained from local pharmacy or Health Dept. Verbalized acceptance and understanding.  Covid-19 vaccine status: Completed vaccines  Qualifies for Shingles Vaccine? Yes   Zostavax completed No   Shingrix Completed?: No.    Education has been provided regarding the importance of this vaccine. Patient has been advised to call insurance company to determine out of pocket  expense if they have not yet received this vaccine. Advised may also receive vaccine at local pharmacy or Health Dept. Verbalized acceptance and understanding.  Screening Tests Health Maintenance  Topic Date Due   Zoster Vaccines- Shingrix (1 of 2) Never done   DEXA SCAN  Never done   COLONOSCOPY (Pts 45-84yr Insurance coverage will need to be confirmed)  05/30/2017   PNA vac Low Risk Adult (2 of 2 - PPSV23) 09/21/2018   COVID-19 Vaccine (3 - Moderna risk series) 11/07/2019   OPHTHALMOLOGY EXAM  01/16/2021   INFLUENZA VACCINE  02/08/2021   HEMOGLOBIN A1C  08/11/2021   MAMMOGRAM  11/21/2021   FOOT EXAM  02/08/2022   TETANUS/TDAP  09/21/2027   Hepatitis C Screening  Completed   HPV VACCINES  Aged Out    Health Maintenance  Health Maintenance Due  Topic Date Due   Zoster Vaccines- Shingrix (1 of 2) Never done   DEXA SCAN  Never done   COLONOSCOPY (Pts 45-473yrInsurance coverage will need to be confirmed)  05/30/2017   PNA vac Low Risk Adult (2 of 2 - PPSV23) 09/21/2018   COVID-19 Vaccine (3 - Moderna risk series) 11/07/2019   OPHTHALMOLOGY EXAM  01/16/2021   INFLUENZA VACCINE  02/08/2021    Colorectal cancer screening: Referral to GI placed 02/08/2021. Pt aware the office will call re: appt.  Mammogram status: Ordered 02/08/2021. Pt provided with contact info and advised to call to schedule appt.   Bone Density status: Ordered 02/08/2021. Pt provided with contact info and advised to call to schedule appt.  Lung Cancer Screening: (Low Dose CT Chest recommended if Age 69-80ears, 30 pack-year currently smoking OR have quit w/in 15years.) does qualify. Patient has smoked for >30 years, currently smokes 5 cig/day. Hx of 1 PPD.  Lung Cancer Screening Referral: placed 02/08/2021  Additional Screening:  Hepatitis C Screening: does qualify; Completed 03/30/2016.  Vision Screening: Recommended annual ophthalmology exams for early detection of glaucoma and other disorders of the  eye. Is the patient up to date with their annual eye exam?  Yes  Who is the provider or what is the name of the office in which the patient attends annual eye exams?  If pt is not established with a provider, would they like to be referred to a provider to establish care? No .   Dental Screening: Recommended annual dental exams for proper oral hygiene  Community Resource Referral / Chronic Care Management: CRR required this visit?  No   CCM required this visit?  No      Plan:  -A1c today has mildly improved from 8.5 to 8.1, discussed with patient to improve medication adherence (missing several doses of Metformin consistently). Advised to set reminder in her phone. Will reassess A1c in 3 months. -Discussed improving diabetic diet.  -Continue current medication regimen. -Will collect fasting labs. -Follow up in 3 months for DM, HLD, Wt  I have personally reviewed and noted the following in the patient's chart:   Medical and social history Use of alcohol, tobacco or illicit drugs  Current medications and supplements including opioid prescriptions.  Functional ability and status Nutritional status Physical activity Advanced directives List of other physicians Hospitalizations, surgeries, and ER visits in previous 12 months Vitals Screenings to include cognitive, depression, and falls Referrals and appointments  In addition, I have reviewed and discussed with patient certain preventive protocols, quality metrics, and best practice recommendations. A written personalized care plan for preventive services as well as general preventive health recommendations were provided to patient.     Lorrene Reid, PA-C   02/08/2021

## 2021-02-09 LAB — LIPID PANEL
Chol/HDL Ratio: 3.1 ratio (ref 0.0–4.4)
Cholesterol, Total: 111 mg/dL (ref 100–199)
HDL: 36 mg/dL — ABNORMAL LOW (ref >39)
LDL Chol Calc (NIH): 44 mg/dL (ref 0–99)
Triglycerides: 192 mg/dL — ABNORMAL HIGH (ref 0–149)
VLDL Cholesterol Cal: 31 mg/dL (ref 5–40)

## 2021-02-09 LAB — COMPREHENSIVE METABOLIC PANEL
ALT: 12 IU/L (ref 0–32)
AST: 15 IU/L (ref 0–40)
Albumin/Globulin Ratio: 1.8 (ref 1.2–2.2)
Albumin: 4.5 g/dL (ref 3.8–4.8)
Alkaline Phosphatase: 66 IU/L (ref 44–121)
BUN/Creatinine Ratio: 13 (ref 12–28)
BUN: 11 mg/dL (ref 8–27)
Bilirubin Total: <0.2 mg/dL (ref 0.0–1.2)
CO2: 24 mmol/L (ref 20–29)
Calcium: 10 mg/dL (ref 8.7–10.3)
Chloride: 103 mmol/L (ref 96–106)
Creatinine, Ser: 0.86 mg/dL (ref 0.57–1.00)
Globulin, Total: 2.5 g/dL (ref 1.5–4.5)
Glucose: 181 mg/dL — ABNORMAL HIGH (ref 65–99)
Potassium: 5.8 mmol/L — ABNORMAL HIGH (ref 3.5–5.2)
Sodium: 143 mmol/L (ref 134–144)
Total Protein: 7 g/dL (ref 6.0–8.5)
eGFR: 74 mL/min/{1.73_m2} (ref >59)

## 2021-02-09 LAB — CBC WITH DIFFERENTIAL/PLATELET
Basophils Absolute: 0.1 10*3/uL (ref 0.0–0.2)
Basos: 1 %
EOS (ABSOLUTE): 0.3 10*3/uL (ref 0.0–0.4)
Eos: 2 %
Hematocrit: 42.9 % (ref 34.0–46.6)
Hemoglobin: 14.3 g/dL (ref 11.1–15.9)
Immature Grans (Abs): 0 10*3/uL (ref 0.0–0.1)
Immature Granulocytes: 0 %
Lymphocytes Absolute: 5.8 10*3/uL — ABNORMAL HIGH (ref 0.7–3.1)
Lymphs: 47 %
MCH: 31.8 pg (ref 26.6–33.0)
MCHC: 33.3 g/dL (ref 31.5–35.7)
MCV: 96 fL (ref 79–97)
Monocytes Absolute: 1.3 10*3/uL — ABNORMAL HIGH (ref 0.1–0.9)
Monocytes: 11 %
Neutrophils Absolute: 4.8 10*3/uL (ref 1.4–7.0)
Neutrophils: 39 %
Platelets: 343 10*3/uL (ref 150–450)
RBC: 4.49 x10E6/uL (ref 3.77–5.28)
RDW: 12.6 % (ref 11.7–15.4)
WBC: 12.3 10*3/uL — ABNORMAL HIGH (ref 3.4–10.8)

## 2021-02-09 LAB — VITAMIN D 25 HYDROXY (VIT D DEFICIENCY, FRACTURES): Vit D, 25-Hydroxy: 55.7 ng/mL (ref 30.0–100.0)

## 2021-02-09 LAB — TSH: TSH: 1.27 u[IU]/mL (ref 0.450–4.500)

## 2021-02-24 ENCOUNTER — Other Ambulatory Visit: Payer: Self-pay | Admitting: Physician Assistant

## 2021-02-24 DIAGNOSIS — E782 Mixed hyperlipidemia: Secondary | ICD-10-CM

## 2021-02-24 MED ORDER — EZETIMIBE 10 MG PO TABS: 10.0000 mg | ORAL_TABLET | Freq: Every day | ORAL | 0 refills | Status: DC

## 2021-02-27 ENCOUNTER — Encounter: Payer: Self-pay | Admitting: Physician Assistant

## 2021-02-27 DIAGNOSIS — B0229 Other postherpetic nervous system involvement: Secondary | ICD-10-CM

## 2021-03-01 ENCOUNTER — Ambulatory Visit
Admission: RE | Admit: 2021-03-01 | Discharge: 2021-03-01 | Disposition: A | Discharge status: Home / Self Care | Payer: Medicare HMO | Source: Ambulatory Visit | Attending: Physician Assistant | Admitting: Physician Assistant

## 2021-03-01 DIAGNOSIS — F17200 Nicotine dependence, unspecified, uncomplicated: Secondary | ICD-10-CM

## 2021-03-01 DIAGNOSIS — R69 Illness, unspecified: Secondary | ICD-10-CM | POA: Diagnosis not present

## 2021-03-01 MED ORDER — GABAPENTIN 300 MG PO CAPS: ORAL_CAPSULE | ORAL | 0 refills | Status: DC

## 2021-03-04 ENCOUNTER — Other Ambulatory Visit: Payer: Self-pay | Admitting: Physician Assistant

## 2021-03-04 ENCOUNTER — Ambulatory Visit
Admission: RE | Admit: 2021-03-04 | Discharge: 2021-03-04 | Disposition: A | Discharge status: Home / Self Care | Payer: Medicare HMO | Source: Ambulatory Visit | Attending: Physician Assistant | Admitting: Physician Assistant

## 2021-03-04 ENCOUNTER — Other Ambulatory Visit: Payer: Self-pay

## 2021-03-04 DIAGNOSIS — Z1231 Encounter for screening mammogram for malignant neoplasm of breast: Secondary | ICD-10-CM | POA: Diagnosis not present

## 2021-03-04 DIAGNOSIS — E559 Vitamin D deficiency, unspecified: Secondary | ICD-10-CM

## 2021-03-04 DIAGNOSIS — Z13820 Encounter for screening for osteoporosis: Secondary | ICD-10-CM

## 2021-03-04 DIAGNOSIS — M8589 Other specified disorders of bone density and structure, multiple sites: Secondary | ICD-10-CM | POA: Diagnosis not present

## 2021-03-04 DIAGNOSIS — Z780 Asymptomatic menopausal state: Secondary | ICD-10-CM | POA: Diagnosis not present

## 2021-03-11 DIAGNOSIS — E119 Type 2 diabetes mellitus without complications: Secondary | ICD-10-CM | POA: Diagnosis not present

## 2021-03-19 ENCOUNTER — Other Ambulatory Visit: Payer: Self-pay | Admitting: Physician Assistant

## 2021-03-19 DIAGNOSIS — E1100 Type 2 diabetes mellitus with hyperosmolarity without nonketotic hyperglycemic-hyperosmolar coma (NKHHC): Secondary | ICD-10-CM

## 2021-03-21 ENCOUNTER — Other Ambulatory Visit: Payer: Self-pay | Admitting: Physician Assistant

## 2021-03-21 DIAGNOSIS — E1100 Type 2 diabetes mellitus with hyperosmolarity without nonketotic hyperglycemic-hyperosmolar coma (NKHHC): Secondary | ICD-10-CM

## 2021-05-05 ENCOUNTER — Other Ambulatory Visit: Payer: Self-pay | Admitting: Physician Assistant

## 2021-05-05 DIAGNOSIS — J3089 Other allergic rhinitis: Secondary | ICD-10-CM

## 2021-05-11 ENCOUNTER — Other Ambulatory Visit: Payer: Self-pay

## 2021-05-11 ENCOUNTER — Ambulatory Visit (INDEPENDENT_AMBULATORY_CARE_PROVIDER_SITE_OTHER): Payer: Medicare HMO | Admitting: Physician Assistant

## 2021-05-11 ENCOUNTER — Encounter: Payer: Self-pay | Admitting: Physician Assistant

## 2021-05-11 VITALS — BP 106/64 | HR 86 | Temp 98.2°F | Ht 62.0 in | Wt 136.4 lb

## 2021-05-11 DIAGNOSIS — Z716 Tobacco abuse counseling: Secondary | ICD-10-CM

## 2021-05-11 DIAGNOSIS — E1169 Type 2 diabetes mellitus with other specified complication: Secondary | ICD-10-CM | POA: Diagnosis not present

## 2021-05-11 DIAGNOSIS — J019 Acute sinusitis, unspecified: Secondary | ICD-10-CM | POA: Diagnosis not present

## 2021-05-11 DIAGNOSIS — E1100 Type 2 diabetes mellitus with hyperosmolarity without nonketotic hyperglycemic-hyperosmolar coma (NKHHC): Secondary | ICD-10-CM | POA: Diagnosis not present

## 2021-05-11 DIAGNOSIS — E785 Hyperlipidemia, unspecified: Secondary | ICD-10-CM

## 2021-05-11 DIAGNOSIS — B0229 Other postherpetic nervous system involvement: Secondary | ICD-10-CM | POA: Diagnosis not present

## 2021-05-11 LAB — POCT GLYCOSYLATED HEMOGLOBIN (HGB A1C): Hemoglobin A1C: 6.5 % — AB (ref 4.0–5.6)

## 2021-05-11 MED ORDER — FLUCONAZOLE 150 MG PO TABS: 150.0000 mg | ORAL_TABLET | Freq: Once | ORAL | 0 refills | Status: AC

## 2021-05-11 MED ORDER — AMOXICILLIN-POT CLAVULANATE 875-125 MG PO TABS: 1.0000 | ORAL_TABLET | Freq: Two times a day (BID) | ORAL | 0 refills | Status: DC

## 2021-05-11 MED ORDER — METFORMIN HCL 1000 MG PO TABS: 1000.0000 mg | ORAL_TABLET | Freq: Two times a day (BID) | ORAL | 1 refills | Status: DC

## 2021-05-11 NOTE — Assessment & Plan Note (Signed)
-  Stable. -Continue current medication regimen.  -Will continue to monitor. 

## 2021-05-11 NOTE — Assessment & Plan Note (Signed)
>>  ASSESSMENT AND PLAN FOR HYPERLIPIDEMIA ASSOCIATED WITH TYPE 2 DIABETES MELLITUS ([place]) WRITTEN ON 05/11/2021 10:24 AM BY ABONZA, MARITZA, PA-C  -Last lipid panel, LDL 44 (at goal). -Will repeat lipid panel and hepatic function. -Continue current medication regimen. -Discussed low fat diet.

## 2021-05-11 NOTE — Patient Instructions (Signed)

## 2021-05-11 NOTE — Progress Notes (Signed)
Established Patient Office Visit  Subjective:  Patient ID: Autumn Walker, female    DOB: 20-Feb-1951  Age: 70 y.o. MRN: [id number]  CC:  Chief Complaint  Patient presents with   Follow-up   Diabetes   Hyperlipidemia    HPI Autumn Walker presents for follow up on diabetes mellitus, hyperlipidemia. Patient has c/o pain on left side of face, pressure across nose bridge, nasal congestion, intermittent sneezing x 2 weeks. left earache (started Saturday). Denies sore throat, fever, cough, n/v/d, body aches or cold chills.   Diabetes mellitus: Pt denies increased urination or thirst. Pt reports medication compliance. No hypoglycemic events. Checking glucose at home. FBS: 130, PPS: 115-120. Reports has been more careful with what she has been eating. Eating more vegetables and limiting bread.  HLD: Pt taking medication as directed without issues. Patient reports has been more active with walking 30-45 minutes 4-6 times/wk.   PHN: Reports takes Gabapentin at bedtime for nerve pain. Does not always take it during the day if not having discomfort or pain.   Tobacco use: Continues to smoke and how much varies: 1/2-2 PPD, tries to stay busy to reduce smoking.    Past Medical History:  Diagnosis Date   Allergy    Blood transfusion without reported diagnosis    Cataract    Diabetes mellitus without complication (HCC)     Past Surgical History:  Procedure Laterality Date   APPENDECTOMY     CHOLECYSTECTOMY     PANCREATECTOMY     SPLENECTOMY, TOTAL      Family History  Problem Relation Age of Onset   Thyroid disease Mother    Heart disease Father    Alcohol abuse Father    Heart attack Father    Hyperlipidemia Father    Depression Sister    Thyroid disease Sister    Thyroid disease Daughter    Diabetes Maternal Grandmother    Heart disease Maternal Grandfather    Cancer Paternal Grandfather        prostate   Hypertension Paternal Grandfather    Alcohol  abuse Brother    Colon cancer Neg Hx    Breast cancer Neg Hx     Social History   Socioeconomic History   Marital status: Married    Spouse name: Not on file   Number of children: Not on file   Years of education: Not on file   Highest education level: Not on file  Occupational History   Not on file  Tobacco Use   Smoking status: Some Days    Packs/day: 0.00    Years: 40.00    Pack years: 0.00    Types: Cigarettes   Smokeless tobacco: Never   Tobacco comments:    2-3 cigs weekly   Vaping Use   Vaping Use: Never used  Substance and Sexual Activity   Alcohol use: No   Drug use: No   Sexual activity: Yes  Other Topics Concern   Not on file  Social History Narrative   Not on file   Social Determinants of Health   Financial Resource Strain: Not on file  Food Insecurity: Not on file  Transportation Needs: Not on file  Physical Activity: Not on file  Stress: Not on file  Social Connections: Not on file  Intimate Partner Violence: Not on file    Outpatient Medications Prior to Visit  Medication Sig Dispense Refill   albuterol (VENTOLIN HFA) 108 (90 Base) MCG/ACT inhaler INHALE 1-2 PUFFS INTO THE  LUNGS EVERY 4 (FOUR) HOURS AS NEEDED FOR WHEEZING OR SHORTNESS OF BREATH. 18 each 0   ezetimibe (ZETIA) 10 MG tablet Take 1 tablet (10 mg total) by mouth daily after supper. 90 tablet 0   fluticasone (FLONASE) 50 MCG/ACT nasal spray USE 1 SPRAY IN EACH NOSTRIL TWICE DAILY FOLLOWING SINUS RINSES 48 mL 0   gabapentin (NEURONTIN) 300 MG capsule TAKE 1 CAPSULE IN AM, 1 CAPSULE IN AFTERNOON AND 2 CAPSULES AT BEDTIME 360 capsule 0   ibuprofen (ADVIL,MOTRIN) 200 MG tablet Take 600 mg by mouth every 6 (six) hours as needed.     losartan (COZAAR) 25 MG tablet TAKE 1 TABLET (25 MG TOTAL) BY MOUTH DAILY. 90 tablet 0   Methylcobalamin (B12-ACTIVE PO) Take by mouth.     montelukast (SINGULAIR) 10 MG tablet TAKE 1 TABLET BY MOUTH EVERYDAY AT BEDTIME 90 tablet 1   omega-3 acid ethyl esters  (LOVAZA) 1 g capsule Take 2 capsules (2 g total) by mouth 2 (two) times daily. 360 capsule 1   rosuvastatin (CRESTOR) 20 MG tablet Take 1 tablet (20 mg total) by mouth daily. 90 tablet 1   sulfamethoxazole-trimethoprim (BACTRIM DS) 800-160 MG tablet Take 1 tablet by mouth 2 (two) times daily. 14 tablet 0   VITAMIN D PO Take by mouth. 50000 iu weekly     fluconazole (DIFLUCAN) 150 MG tablet Take 1 tablet po once. May repeat dose in 3 days as needed for persistent symptoms. 3 tablet 0   metFORMIN (GLUCOPHAGE) 1000 MG tablet TAKE 1 TABLET (1,000 MG TOTAL) BY MOUTH 2 (TWO) TIMES DAILY WITH A MEAL. 180 tablet 0   No facility-administered medications prior to visit.    Allergies  Allergen Reactions   Atorvastatin Other (See Comments)    Leg cramps   Codeine Nausea Only    ROS Review of Systems Review of Systems:  A fourteen system review of systems was performed and found to be positive as per HPI.   Objective:    Physical Exam General:  Well Developed, well nourished, appropriate for stated age.  Neuro:  Alert and oriented,  extra-ocular muscles intact  HEENT:  Normocephalic, atraumatic, tenderness of maxillary and frontal sinus, clear conjunctiva, no eyelid swelling, serous fluid behind both TM's with some erythema, positive Tragus sign bilaterally, positive Pinna sign of left ear, neck supple, no adenopathy  Skin:  no gross rash, warm, pink. Cardiac:  RRR Respiratory: CTA B/L, Not using accessory muscles, speaking in full sentences- unlabored. Vascular:  Ext warm, no cyanosis apprec.; cap RF less 2 sec. Psych:  No HI/SI, judgement and insight good, Euthymic mood. Full Affect.  BP 106/64   Pulse 86   Temp 98.2 F (36.8 C)   Ht 5\' 2"  (1.575 m)   Wt 136 lb 6.4 oz (61.9 kg)   SpO2 100%   BMI 24.95 kg/m  Wt Readings from Last 3 Encounters:  05/11/21 136 lb 6.4 oz (61.9 kg)  02/08/21 143 lb 8 oz (65.1 kg)  10/29/20 141 lb 12.8 oz (64.3 kg)     Health Maintenance Due  Topic  Date Due   Zoster Vaccines- Shingrix (1 of 2) Never done   DEXA SCAN  Never done   COLONOSCOPY (Pts 45-21yrs Insurance coverage will need to be confirmed)  05/30/2017   Pneumonia Vaccine 13+ Years old (2 - PPSV23 if available, else PCV20) 09/21/2018   COVID-19 Vaccine (3 - Moderna risk series) 11/07/2019   OPHTHALMOLOGY EXAM  01/16/2021   INFLUENZA VACCINE  02/08/2021  There are no preventive care reminders to display for this patient.  Lab Results  Component Value Date   TSH 1.270 02/08/2021   Lab Results  Component Value Date   WBC 12.3 (H) 02/08/2021   HGB 14.3 02/08/2021   HCT 42.9 02/08/2021   MCV 96 02/08/2021   PLT 343 02/08/2021   Lab Results  Component Value Date   NA 143 02/08/2021   K 5.8 (H) 02/08/2021   CO2 24 02/08/2021   GLUCOSE 181 (H) 02/08/2021   BUN 11 02/08/2021   CREATININE 0.86 02/08/2021   BILITOT <0.2 02/08/2021   ALKPHOS 66 02/08/2021   AST 15 02/08/2021   ALT 12 02/08/2021   PROT 7.0 02/08/2021   ALBUMIN 4.5 02/08/2021   CALCIUM 10.0 02/08/2021   EGFR 74 02/08/2021   Lab Results  Component Value Date   CHOL 111 02/08/2021   Lab Results  Component Value Date   HDL 36 (L) 02/08/2021   Lab Results  Component Value Date   LDLCALC 44 02/08/2021   Lab Results  Component Value Date   TRIG 192 (H) 02/08/2021   Lab Results  Component Value Date   CHOLHDL 3.1 02/08/2021   Lab Results  Component Value Date   HGBA1C 6.5 (A) 05/11/2021      Assessment & Plan:   Problem List Items Addressed This Visit       Endocrine   Diabetes (HCC) - Primary (Chronic)    -A1c has improved from 8.1 to 6.5, will continue current medication regimen. Recommend to continue with dietary changes and walking regimen. -Continue ambulatory glucose monitoring. -Will continue to monitor.      Relevant Medications   metFORMIN (GLUCOPHAGE) 1000 MG tablet   Other Relevant Orders   POCT glycosylated hemoglobin (Hb A1C) (Completed)   Comp Met (CMET)    CBC w/Diff   Hyperlipidemia associated with type 2 diabetes mellitus (HCC) (Chronic)    -Last lipid panel, LDL 44 (at goal). -Will repeat lipid panel and hepatic function. -Continue current medication regimen. -Discussed low fat diet.      Relevant Medications   metFORMIN (GLUCOPHAGE) 1000 MG tablet   Other Relevant Orders   Comp Met (CMET)   CBC w/Diff   Lipid Profile     Nervous and Auditory   PHN (postherpetic neuralgia) (Chronic)    -Stable. -Continue current medication regimen. -Will continue to monitor.        Other   Tobacco abuse counseling (Chronic)    -The patient was counseled on the dangers of tobacco use, and was advised to quit.  Reviewed strategies to maximize success, including removing cigarettes and smoking materials from environment, stress management and support of family/friends. -Encourage to continue with reducing tobacco use and eventually quit.      Other Visit Diagnoses     Acute non-recurrent sinusitis, unspecified location       Relevant Medications   amoxicillin-clavulanate (AUGMENTIN) 875-125 MG tablet   fluconazole (DIFLUCAN) 150 MG tablet      Patient has s/sx consistent with sinusitis with symptoms ongoing >10 days and signs of acute otitis externa so will start oral antibiotic therapy. Patient reports hx of yeast infection with antibiotic use so will send rx for fluconazole. Recommend to continue home supportive care. Follow up if symptoms fail to improve or worsen.   Meds ordered this encounter  Medications   metFORMIN (GLUCOPHAGE) 1000 MG tablet    Sig: Take 1 tablet (1,000 mg total) by mouth 2 (two) times daily  with a meal.    Dispense:  180 tablet    Refill:  1   amoxicillin-clavulanate (AUGMENTIN) 875-125 MG tablet    Sig: Take 1 tablet by mouth 2 (two) times daily.    Dispense:  20 tablet    Refill:  0    Order Specific Question:   Supervising Provider    Answer:   Nani Gasser D [[id number]]   fluconazole (DIFLUCAN) 150 MG  tablet    Sig: Take 1 tablet (150 mg total) by mouth once for 1 dose.    Dispense:  1 tablet    Refill:  0    Order Specific Question:   Supervising Provider    Answer:   Nani Gasser D [[id number]]    Follow-up: Return in about 4 months (around 09/08/2021) for DM, HLD, PHN .    Mayer Masker, PA-C

## 2021-05-11 NOTE — Assessment & Plan Note (Addendum)
-  Last lipid panel, LDL 44 (at goal). -Will repeat lipid panel and hepatic function. -Continue current medication regimen. -Discussed low fat diet.

## 2021-05-11 NOTE — Assessment & Plan Note (Signed)
-  A1c has improved from 8.1 to 6.5, will continue current medication regimen. Recommend to continue with dietary changes and walking regimen. -Continue ambulatory glucose monitoring. -Will continue to monitor.

## 2021-05-11 NOTE — Assessment & Plan Note (Signed)
-  The patient was counseled on the dangers of tobacco use, and was advised to quit.  Reviewed strategies to maximize success, including removing cigarettes and smoking materials from environment, stress management and support of family/friends. -Encourage to continue with reducing tobacco use and eventually quit.

## 2021-05-12 LAB — COMPREHENSIVE METABOLIC PANEL
ALT: 12 IU/L (ref 0–32)
AST: 14 IU/L (ref 0–40)
Albumin/Globulin Ratio: 2.1 (ref 1.2–2.2)
Albumin: 4.8 g/dL (ref 3.8–4.8)
Alkaline Phosphatase: 60 IU/L (ref 44–121)
BUN/Creatinine Ratio: 17 (ref 12–28)
BUN: 17 mg/dL (ref 8–27)
Bilirubin Total: 0.2 mg/dL (ref 0.0–1.2)
CO2: 24 mmol/L (ref 20–29)
Calcium: 10.2 mg/dL (ref 8.7–10.3)
Chloride: 105 mmol/L (ref 96–106)
Creatinine, Ser: 1.03 mg/dL — ABNORMAL HIGH (ref 0.57–1.00)
Globulin, Total: 2.3 g/dL (ref 1.5–4.5)
Glucose: 123 mg/dL — ABNORMAL HIGH (ref 70–99)
Potassium: 5.8 mmol/L — ABNORMAL HIGH (ref 3.5–5.2)
Sodium: 144 mmol/L (ref 134–144)
Total Protein: 7.1 g/dL (ref 6.0–8.5)
eGFR: 59 mL/min/{1.73_m2} — ABNORMAL LOW (ref >59)

## 2021-05-12 LAB — CBC WITH DIFFERENTIAL/PLATELET
Basophils Absolute: 0.1 10*3/uL (ref 0.0–0.2)
Basos: 1 %
EOS (ABSOLUTE): 0.4 10*3/uL (ref 0.0–0.4)
Eos: 3 %
Hematocrit: 42.6 % (ref 34.0–46.6)
Hemoglobin: 14.9 g/dL (ref 11.1–15.9)
Immature Grans (Abs): 0 10*3/uL (ref 0.0–0.1)
Immature Granulocytes: 0 %
Lymphocytes Absolute: 6.2 10*3/uL — ABNORMAL HIGH (ref 0.7–3.1)
Lymphs: 52 %
MCH: 32 pg (ref 26.6–33.0)
MCHC: 35 g/dL (ref 31.5–35.7)
MCV: 92 fL (ref 79–97)
Monocytes Absolute: 1.1 10*3/uL — ABNORMAL HIGH (ref 0.1–0.9)
Monocytes: 9 %
Neutrophils Absolute: 4.3 10*3/uL (ref 1.4–7.0)
Neutrophils: 35 %
Platelets: 369 10*3/uL (ref 150–450)
RBC: 4.65 x10E6/uL (ref 3.77–5.28)
RDW: 12.2 % (ref 11.7–15.4)
WBC: 12.1 10*3/uL — ABNORMAL HIGH (ref 3.4–10.8)

## 2021-05-12 LAB — LIPID PANEL
Chol/HDL Ratio: 2.9 ratio (ref 0.0–4.4)
Cholesterol, Total: 112 mg/dL (ref 100–199)
HDL: 38 mg/dL — ABNORMAL LOW (ref >39)
LDL Chol Calc (NIH): 42 mg/dL (ref 0–99)
Triglycerides: 195 mg/dL — ABNORMAL HIGH (ref 0–149)
VLDL Cholesterol Cal: 32 mg/dL (ref 5–40)

## 2021-05-25 ENCOUNTER — Other Ambulatory Visit: Payer: Self-pay

## 2021-05-25 DIAGNOSIS — E782 Mixed hyperlipidemia: Secondary | ICD-10-CM

## 2021-05-25 MED ORDER — EZETIMIBE 10 MG PO TABS: 10.0000 mg | ORAL_TABLET | Freq: Every day | ORAL | 1 refills | Status: DC

## 2021-05-26 ENCOUNTER — Other Ambulatory Visit: Payer: Self-pay

## 2021-05-26 DIAGNOSIS — E782 Mixed hyperlipidemia: Secondary | ICD-10-CM

## 2021-05-26 MED ORDER — EZETIMIBE 10 MG PO TABS: 10.0000 mg | ORAL_TABLET | Freq: Every day | ORAL | 1 refills | Status: DC

## 2021-06-10 DIAGNOSIS — E1100 Type 2 diabetes mellitus with hyperosmolarity without nonketotic hyperglycemic-hyperosmolar coma (NKHHC): Secondary | ICD-10-CM | POA: Diagnosis not present

## 2021-06-18 ENCOUNTER — Other Ambulatory Visit: Payer: Self-pay | Admitting: Physician Assistant

## 2021-06-18 DIAGNOSIS — B0229 Other postherpetic nervous system involvement: Secondary | ICD-10-CM

## 2021-06-21 ENCOUNTER — Encounter: Payer: Self-pay | Admitting: Physician Assistant

## 2021-06-21 MED ORDER — GABAPENTIN 300 MG PO CAPS: ORAL_CAPSULE | ORAL | 0 refills | Status: DC

## 2021-07-16 ENCOUNTER — Other Ambulatory Visit: Payer: Self-pay | Admitting: Physician Assistant

## 2021-07-16 DIAGNOSIS — E1100 Type 2 diabetes mellitus with hyperosmolarity without nonketotic hyperglycemic-hyperosmolar coma (NKHHC): Secondary | ICD-10-CM

## 2021-07-27 DIAGNOSIS — Z20822 Contact with and (suspected) exposure to covid-19: Secondary | ICD-10-CM | POA: Diagnosis not present

## 2021-08-03 ENCOUNTER — Other Ambulatory Visit: Payer: Self-pay | Admitting: Physician Assistant

## 2021-08-03 DIAGNOSIS — E1169 Type 2 diabetes mellitus with other specified complication: Secondary | ICD-10-CM

## 2021-08-03 DIAGNOSIS — E781 Pure hyperglyceridemia: Secondary | ICD-10-CM

## 2021-09-09 ENCOUNTER — Ambulatory Visit: Payer: Medicare HMO | Admitting: Physician Assistant

## 2021-09-28 ENCOUNTER — Ambulatory Visit (INDEPENDENT_AMBULATORY_CARE_PROVIDER_SITE_OTHER): Payer: Medicare HMO | Admitting: Physician Assistant

## 2021-09-28 ENCOUNTER — Other Ambulatory Visit: Payer: Self-pay

## 2021-09-28 ENCOUNTER — Telehealth: Payer: Self-pay | Admitting: Physician Assistant

## 2021-09-28 ENCOUNTER — Encounter: Payer: Self-pay | Admitting: Physician Assistant

## 2021-09-28 VITALS — BP 106/62 | HR 88 | Temp 97.8°F | Ht 61.5 in | Wt 132.0 lb

## 2021-09-28 DIAGNOSIS — E785 Hyperlipidemia, unspecified: Secondary | ICD-10-CM

## 2021-09-28 DIAGNOSIS — R911 Solitary pulmonary nodule: Secondary | ICD-10-CM

## 2021-09-28 DIAGNOSIS — E782 Mixed hyperlipidemia: Secondary | ICD-10-CM

## 2021-09-28 DIAGNOSIS — Z716 Tobacco abuse counseling: Secondary | ICD-10-CM | POA: Diagnosis not present

## 2021-09-28 DIAGNOSIS — E1100 Type 2 diabetes mellitus with hyperosmolarity without nonketotic hyperglycemic-hyperosmolar coma (NKHHC): Secondary | ICD-10-CM | POA: Diagnosis not present

## 2021-09-28 DIAGNOSIS — E1169 Type 2 diabetes mellitus with other specified complication: Secondary | ICD-10-CM | POA: Diagnosis not present

## 2021-09-28 DIAGNOSIS — J439 Emphysema, unspecified: Secondary | ICD-10-CM | POA: Diagnosis not present

## 2021-09-28 DIAGNOSIS — B0229 Other postherpetic nervous system involvement: Secondary | ICD-10-CM

## 2021-09-28 DIAGNOSIS — J301 Allergic rhinitis due to pollen: Secondary | ICD-10-CM | POA: Diagnosis not present

## 2021-09-28 LAB — POCT GLYCOSYLATED HEMOGLOBIN (HGB A1C): Hemoglobin A1C: 6.4 % — AB (ref 4.0–5.6)

## 2021-09-28 MED ORDER — EZETIMIBE 10 MG PO TABS: 10.0000 mg | ORAL_TABLET | Freq: Every day | ORAL | 1 refills | Status: DC

## 2021-09-28 NOTE — Assessment & Plan Note (Addendum)
-  A1c stable at 6.4. ?-Continue current medication regimen. See med list. ?-Recommend to follow a diabetic diet low in carbohydrates and glucose. ?-Will continue to monitor. ?

## 2021-09-28 NOTE — Assessment & Plan Note (Signed)
-  Encourage to stop smoking.  ?-Will place order for repeat low dose chest CT for lung nodule <8 mm.  ?

## 2021-09-28 NOTE — Progress Notes (Signed)
?Established patient visit ? ? ?Patient: Autumn Walker   DOB: 31-May-1951   71 y.o. Female  MRN: [id number] ?Visit Date: 09/28/2021 ? ?Chief Complaint  ?Patient presents with  ? Follow-up  ? Diabetes  ? Hyperlipidemia  ? ?Subjective  ?  ?HPI  ?Patient presents for follow up on diabetes mellitus and hypertension. Patient reports continues to smoke about 1/2-1 PPD.  ? ?Diabetes: Pt denies increased urination or thirst from baseline. Pt reports medication compliance. No hypoglycemic events. Checking glucose at home. FBS range from 130-135. Reports eating more protein. Occasionally has sweets. ? ?HLD: Pt taking medication as directed without issues. Reports eating vegetables and lean protein. Patient reports continues to walk every other day for 30-45 minutes.  ? ?Allergies:  Reports medication compliance with montelukast and also taking oral antihistamine (Zyrtec). Some days will take Tylenol sinus medication if needed. Reports has wheezing when allergies flare-up. Denies recent use of albuterol. No shortness of breath. ? ?PHN: Reports taking Gabapentin at bedtime. Takes dose during the day if needed. Symptoms are stable with medication.  ? ? ? ?Medications: ?Outpatient Medications Prior to Visit  ?Medication Sig  ? albuterol (VENTOLIN HFA) 108 (90 Base) MCG/ACT inhaler INHALE 1-2 PUFFS INTO THE LUNGS EVERY 4 (FOUR) HOURS AS NEEDED FOR WHEEZING OR SHORTNESS OF BREATH.  ? ezetimibe (ZETIA) 10 MG tablet Take 1 tablet (10 mg total) by mouth daily after supper.  ? fluticasone (FLONASE) 50 MCG/ACT nasal spray USE 1 SPRAY IN EACH NOSTRIL TWICE DAILY FOLLOWING SINUS RINSES  ? gabapentin (NEURONTIN) 300 MG capsule TAKE 1 CAPSULE IN AM, 1 CAPSULE IN AFTERNOON AND 2 CAPSULES AT BEDTIME  ? ibuprofen (ADVIL,MOTRIN) 200 MG tablet Take 600 mg by mouth every 6 (six) hours as needed.  ? losartan (COZAAR) 25 MG tablet TAKE 1 TABLET (25 MG TOTAL) BY MOUTH DAILY.  ? metFORMIN (GLUCOPHAGE) 1000 MG tablet Take 1 tablet (1,000 mg  total) by mouth 2 (two) times daily with a meal.  ? Methylcobalamin (B12-ACTIVE PO) Take by mouth.  ? montelukast (SINGULAIR) 10 MG tablet TAKE 1 TABLET BY MOUTH EVERYDAY AT BEDTIME  ? omega-3 acid ethyl esters (LOVAZA) 1 g capsule Take 2 capsules (2 g total) by mouth 2 (two) times daily.  ? rosuvastatin (CRESTOR) 20 MG tablet TAKE 1 TABLET BY MOUTH EVERY DAY  ? VITAMIN D PO Take by mouth. 50000 iu weekly  ? [DISCONTINUED] amoxicillin-clavulanate (AUGMENTIN) 875-125 MG tablet Take 1 tablet by mouth 2 (two) times daily.  ? [DISCONTINUED] sulfamethoxazole-trimethoprim (BACTRIM DS) 800-160 MG tablet Take 1 tablet by mouth 2 (two) times daily.  ? ?No facility-administered medications prior to visit.  ? ? ?Review of Systems ?Review of Systems:  ?A fourteen system review of systems was performed and found to be positive as per HPI. ? ? ?  Objective  ?  ?BP 106/62   Pulse 88   Temp 97.8 ?F (36.6 ?C)   Ht 5' 1.5" (1.562 m)   Wt 132 lb (59.9 kg)   SpO2 98%   BMI 24.54 kg/m?  ?BP Readings from Last 3 Encounters:  ?09/28/21 106/62  ?05/11/21 106/64  ?02/08/21 (!) 95/59  ? ?Wt Readings from Last 3 Encounters:  ?09/28/21 132 lb (59.9 kg)  ?05/11/21 136 lb 6.4 oz (61.9 kg)  ?02/08/21 143 lb 8 oz (65.1 kg)  ? ? ?Physical Exam  ?General:  Well Developed, well nourished, appropriate for stated age.  ?Neuro:  Alert and oriented,  extra-ocular muscles intact  ?HEENT:  Normocephalic, atraumatic,  neck supple  ?Skin:  no gross rash, warm, pink. ?Cardiac:  RRR, S1 S2 ?Respiratory: mild expiratory wheezing, no crackles or rales. ?Vascular:  Ext warm, no cyanosis apprec.; cap RF less 2 sec. ?Psych:  No HI/SI, judgement and insight good, Euthymic mood. Full Affect. ? ? ?Results for orders placed or performed in visit on 09/28/21  ?POCT glycosylated hemoglobin (Hb A1C)  ?Result Value Ref Range  ? Hemoglobin A1C 6.4 (A) 4.0 - 5.6 %  ? HbA1c POC (<> result, manual entry)    ? HbA1c, POC (prediabetic range)    ? HbA1c, POC (controlled  diabetic range)    ? ? Assessment & Plan  ?  ? ? ?Problem List Items Addressed This Visit   ? ?  ? Respiratory  ? Pulmonary emphysema (HCC)  ?  -Encourage to stop smoking.  ?-Will place order for repeat low dose chest CT for lung nodule <8 mm.  ?  ?  ? Relevant Orders  ? CT CHEST NODULE FOLLOW UP LOW DOSE W/O  ? Allergic rhinitis due to pollen  ?  -Stable. ?-Continue current medication regimen. ?-Will continue to monitor. ?  ?  ?  ? Endocrine  ? Diabetes (HCC) - Primary (Chronic)  ?  -A1c stable at 6.5. ?-Continue current medication regimen. See med list. ?-Recommend to follow a diabetic diet low in carbohydrates and glucose. ?-Will continue to monitor. ?  ?  ? Relevant Orders  ? POCT glycosylated hemoglobin (Hb A1C) (Completed)  ? Hyperlipidemia associated with type 2 diabetes mellitus (HCC) (Chronic)  ?  -Last lipid panel: HDL 38, LDL 42 (at goal <70) ?-Continue current medication regimen. ?-Recommend to follow a heart healthy diet low in fat and carbohydrates. ?-Will continue to monitor. ?  ?  ?  ? Nervous and Auditory  ? PHN (postherpetic neuralgia) (Chronic)  ?  -Stable. ?-Continue current medication regimen. ?-Will continue to monitor. ?  ?  ?  ? Other  ? Tobacco abuse counseling (Chronic)  ?  -The patient was counseled on the dangers of tobacco use, and was advised to quit and reluctant to quit.  Reviewed strategies to maximize success, including removing cigarettes and smoking materials from environment, stress management and support of family/friends. Encourage to reduce use and eventually quit. ? ?  ?  ? ?Other Visit Diagnoses   ? ? Lung nodule      ? Relevant Orders  ? CT CHEST NODULE FOLLOW UP LOW DOSE W/O  ? ?  ? ? ?Return in about 5 months (around 02/28/2022) for [facility] (ok to do with [facility]) and FBW few days prior .  ?   ? ? ? ?Mayer Masker, PA-C  ?[facility] ?[phone] (phone) ?[phone] (fax) ? ?[facility] ?

## 2021-09-28 NOTE — Assessment & Plan Note (Signed)
-  Stable. -Continue current medication regimen.  -Will continue to monitor. 

## 2021-09-28 NOTE — Telephone Encounter (Signed)
Patient requesting refill of Zetia and for it to go to the mail order pharmacy.  ?

## 2021-09-28 NOTE — Telephone Encounter (Signed)
Refill sent.

## 2021-09-28 NOTE — Patient Instructions (Signed)

## 2021-09-28 NOTE — Assessment & Plan Note (Addendum)
-  The patient was counseled on the dangers of tobacco use, and was advised to quit and reluctant to quit.  Reviewed strategies to maximize success, including removing cigarettes and smoking materials from environment, stress management and support of family/friends. Encourage to reduce use and eventually quit. ? ?

## 2021-09-28 NOTE — Assessment & Plan Note (Signed)
>>  ASSESSMENT AND PLAN FOR HYPERLIPIDEMIA ASSOCIATED WITH TYPE 2 DIABETES MELLITUS ([place]) WRITTEN ON 09/28/2021  9:03 AM BY ABONZA, MARITZA, PA-C  -Last lipid panel: HDL 38, LDL 42 (at goal <70) -Continue current medication regimen. -Recommend to follow a heart healthy diet low in fat and carbohydrates. -Will continue to monitor.

## 2021-09-28 NOTE — Assessment & Plan Note (Signed)
-  Last lipid panel: HDL 38, LDL 42 (at goal <70) ?-Continue current medication regimen. ?-Recommend to follow a heart healthy diet low in fat and carbohydrates. ?-Will continue to monitor. ?

## 2021-09-30 ENCOUNTER — Other Ambulatory Visit: Payer: Self-pay

## 2021-09-30 ENCOUNTER — Telehealth (INDEPENDENT_AMBULATORY_CARE_PROVIDER_SITE_OTHER): Payer: Medicare HMO | Admitting: Physician Assistant

## 2021-09-30 ENCOUNTER — Encounter: Payer: Self-pay | Admitting: Physician Assistant

## 2021-09-30 DIAGNOSIS — Z91199 Patient's noncompliance with other medical treatment and regimen due to unspecified reason: Secondary | ICD-10-CM

## 2021-09-30 NOTE — Progress Notes (Signed)
Attempted to contact patient 5 times with no answer. No show for virtual visit.  ?

## 2021-10-01 ENCOUNTER — Encounter: Payer: Self-pay | Admitting: Nurse Practitioner

## 2021-10-01 ENCOUNTER — Telehealth (INDEPENDENT_AMBULATORY_CARE_PROVIDER_SITE_OTHER): Payer: Medicare HMO | Admitting: Nurse Practitioner

## 2021-10-01 VITALS — Ht 61.5 in | Wt 132.0 lb

## 2021-10-01 DIAGNOSIS — J0140 Acute pansinusitis, unspecified: Secondary | ICD-10-CM

## 2021-10-01 DIAGNOSIS — R051 Acute cough: Secondary | ICD-10-CM | POA: Insufficient documentation

## 2021-10-01 MED ORDER — SULFAMETHOXAZOLE-TRIMETHOPRIM 800-160 MG PO TABS: 1.0000 | ORAL_TABLET | Freq: Two times a day (BID) | ORAL | 0 refills | Status: DC

## 2021-10-01 MED ORDER — METHYLPREDNISOLONE 4 MG PO TBPK: ORAL_TABLET | ORAL | 0 refills | Status: DC

## 2021-10-01 MED ORDER — BENZONATATE 200 MG PO CAPS: 200.0000 mg | ORAL_CAPSULE | Freq: Two times a day (BID) | ORAL | 0 refills | Status: DC | PRN

## 2021-10-01 NOTE — Progress Notes (Signed)
Virtual Visit via Telephone Note ? ?I connected with Autumn Walker on 10/01/21 at 10:40 AM EDT by telephone and verified that I am speaking with the correct person using two identifiers. ? ?Location: ?Patient: home ?Provider: [place] [facility]   ?  ?I discussed the limitations, risks, security and privacy concerns of performing an evaluation and management service by telephone and the availability of in person appointments. I also discussed with the patient that there may be a patient responsible charge related to this service. The patient expressed understanding and agreed to proceed. ? ? ?History of Present Illness: ?The patient states that she is feeling poorly. Has sinus and nasal congestion. She states that she has developed a deep cough which hurts her chest. It produces a thick yellow sputum. She states that her throat hurts and her ears feel itchy deep inside. She has had a low grade fever off and on. It has been 99.6 and 99.8. she states that symptoms started late Tuesday night and have gradually been getting worse. She did take a home test for COVID 19 which was negative. She states that her husband has same symptoms which started two days prior to her own.  ?  ?Observations/Objective: ? ?The patient is alert and oriented. She is pleasant and answers all questions appropriately. Breathing is non-labored. She is in no acute distress at this time.  The patient is nasally congested. There is a loose and deep cough heard during today's visit.  ? ?Today's Vitals  ? 10/01/21 1107  ?Weight: 132 lb (59.9 kg)  ?Height: 5' 1.5" (1.562 m)  ? ?Body mass index is 24.54 kg/m?.  ? ?Assessment and Plan: ?1. Acute non-recurrent pansinusitis ?Start bactrim DS twice daily for 7 days. Add medrol taper. Take as directed for 6 days. Rest and increase fluids. Continue using OTC medication to control symptoms.   ?- methylPREDNISolone (MEDROL) 4 MG TBPK tablet; Take by mouth as directed for 6 days   Dispense: 21 tablet; Refill: 0 ?- sulfamethoxazole-trimethoprim (BACTRIM DS) 800-160 MG tablet; Take 1 tablet by mouth 2 (two) times daily.  Dispense: 14 tablet; Refill: 0 ? ?2. Acute cough ?Medrol taper - take as directed for 6 days. May take tessalon perls twice daily as needed for cough. Encouraged her to use rescue inhaler as needed and as prescribed.  ?- methylPREDNISolone (MEDROL) 4 MG TBPK tablet; Take by mouth as directed for 6 days  Dispense: 21 tablet; Refill: 0 ?- benzonatate (TESSALON) 200 MG capsule; Take 1 capsule (200 mg total) by mouth 2 (two) times daily as needed for cough.  Dispense: 20 capsule; Refill: 0  ? ?Follow Up Instructions: ? ?  ?I discussed the assessment and treatment plan with the patient. The patient was provided an opportunity to ask questions and all were answered. The patient agreed with the plan and demonstrated an understanding of the instructions. ?  ?The patient was advised to call back or seek an in-person evaluation if the symptoms worsen or if the condition fails to improve as anticipated. ? ?I provided 10 minutes of non-face-to-face time during this encounter. ? ? ?Ronnell Freshwater, NP  ?

## 2021-10-07 ENCOUNTER — Other Ambulatory Visit: Payer: Self-pay | Admitting: Physician Assistant

## 2021-10-07 DIAGNOSIS — B0229 Other postherpetic nervous system involvement: Secondary | ICD-10-CM

## 2021-10-08 MED ORDER — GABAPENTIN 300 MG PO CAPS: ORAL_CAPSULE | ORAL | 0 refills | Status: DC

## 2021-10-25 ENCOUNTER — Telehealth: Payer: Self-pay | Admitting: Physician Assistant

## 2021-10-25 NOTE — Telephone Encounter (Signed)
Patient has appointment on 04/19 for a CT, [facility] needs you to cosign the order for a CT chest without contrast please.  ?

## 2021-10-25 NOTE — Telephone Encounter (Signed)
Done. AS, CMA °

## 2021-10-27 ENCOUNTER — Ambulatory Visit
Admission: RE | Admit: 2021-10-27 | Discharge: 2021-10-27 | Disposition: A | Discharge status: Home / Self Care | Payer: Medicare HMO | Source: Ambulatory Visit | Attending: Physician Assistant | Admitting: Physician Assistant

## 2021-10-27 DIAGNOSIS — R911 Solitary pulmonary nodule: Secondary | ICD-10-CM

## 2021-10-27 DIAGNOSIS — J439 Emphysema, unspecified: Secondary | ICD-10-CM | POA: Diagnosis not present

## 2021-11-03 ENCOUNTER — Other Ambulatory Visit: Payer: Self-pay | Admitting: Physician Assistant

## 2021-11-03 DIAGNOSIS — J3089 Other allergic rhinitis: Secondary | ICD-10-CM

## 2021-12-02 ENCOUNTER — Encounter: Payer: Self-pay | Admitting: Physician Assistant

## 2021-12-03 ENCOUNTER — Other Ambulatory Visit: Payer: Self-pay

## 2021-12-03 DIAGNOSIS — E782 Mixed hyperlipidemia: Secondary | ICD-10-CM

## 2021-12-03 MED ORDER — EZETIMIBE 10 MG PO TABS: 10.0000 mg | ORAL_TABLET | Freq: Every day | ORAL | 0 refills | Status: DC

## 2021-12-17 ENCOUNTER — Other Ambulatory Visit: Payer: Self-pay | Admitting: Physician Assistant

## 2021-12-17 DIAGNOSIS — E1100 Type 2 diabetes mellitus with hyperosmolarity without nonketotic hyperglycemic-hyperosmolar coma (NKHHC): Secondary | ICD-10-CM

## 2021-12-24 ENCOUNTER — Encounter: Payer: Self-pay | Admitting: Physician Assistant

## 2021-12-27 ENCOUNTER — Encounter: Payer: Self-pay | Admitting: Physician Assistant

## 2021-12-27 NOTE — Telephone Encounter (Signed)
Ok thanks 

## 2021-12-28 NOTE — Telephone Encounter (Signed)
Ok thanks 

## 2021-12-29 ENCOUNTER — Telehealth: Payer: Medicare HMO | Admitting: Nurse Practitioner

## 2021-12-29 DIAGNOSIS — J0140 Acute pansinusitis, unspecified: Secondary | ICD-10-CM | POA: Diagnosis not present

## 2021-12-29 MED ORDER — DOXYCYCLINE HYCLATE 100 MG PO TABS: 100.0000 mg | ORAL_TABLET | Freq: Two times a day (BID) | ORAL | 0 refills | Status: AC

## 2021-12-29 NOTE — Progress Notes (Signed)
E-Visit for Sinus Problems ? ?We are sorry that you are not feeling well.  Here is how we plan to help! ? ?Based on what you have shared with me it looks like you have sinusitis.  Sinusitis is inflammation and infection in the sinus cavities of the head.  Based on your presentation I believe you most likely have Acute Bacterial Sinusitis.  This is an infection caused by bacteria and is treated with antibiotics. I have prescribed Doxycycline 100mg by mouth twice a day for 10 days. You may use an oral decongestant such as Mucinex D or if you have glaucoma or high blood pressure use plain Mucinex. Saline nasal spray help and can safely be used as often as needed for congestion.  If you develop worsening sinus pain, fever or notice severe headache and vision changes, or if symptoms are not better after completion of antibiotic, please schedule an appointment with a health care provider.   ? ?Sinus infections are not as easily transmitted as other respiratory infection, however we still recommend that you avoid close contact with loved ones, especially the very young and elderly.  Remember to wash your hands thoroughly throughout the day as this is the number one way to prevent the spread of infection! ? ?Home Care: ?Only take medications as instructed by your medical team. ?Complete the entire course of an antibiotic. ?Do not take these medications with alcohol. ?A steam or ultrasonic humidifier can help congestion.  You can place a towel over your head and breathe in the steam from hot water coming from a faucet. ?Avoid close contacts especially the very young and the elderly. ?Cover your mouth when you cough or sneeze. ?Always remember to wash your hands. ? ?Get Help Right Away If: ?You develop worsening fever or sinus pain. ?You develop a severe head ache or visual changes. ?Your symptoms persist after you have completed your treatment plan. ? ?Make sure you ?Understand these instructions. ?Will watch your  condition. ?Will get help right away if you are not doing well or get worse. ? ?Thank you for choosing an e-visit. ? ?Your e-visit answers were reviewed by a board certified advanced clinical practitioner to complete your personal care plan. Depending upon the condition, your plan could have included both over the counter or prescription medications. ? ?Please review your pharmacy choice. Make sure the pharmacy is open so you can pick up prescription now. If there is a problem, you may contact your provider through MyChart messaging and have the prescription routed to another pharmacy.  Your safety is important to us. If you have drug allergies check your prescription carefully.  ? ?For the next 24 hours you can use MyChart to ask questions about today's visit, request a non-urgent call back, or ask for a work or school excuse. ?You will get an email in the next two days asking about your experience. I hope that your e-visit has been valuable and will speed your recovery.  ? ?Meds ordered this encounter  ?Medications  ? doxycycline (VIBRA-TABS) 100 MG tablet  ?  Sig: Take 1 tablet (100 mg total) by mouth 2 (two) times daily for 10 days.  ?  Dispense:  20 tablet  ?  Refill:  0  ?  ?I spent approximately 5 minutes reviewing the patient's history, current symptoms and coordinating their plan of care today.   ?

## 2022-01-02 ENCOUNTER — Other Ambulatory Visit: Payer: Self-pay | Admitting: Physician Assistant

## 2022-01-02 DIAGNOSIS — E1100 Type 2 diabetes mellitus with hyperosmolarity without nonketotic hyperglycemic-hyperosmolar coma (NKHHC): Secondary | ICD-10-CM

## 2022-01-07 ENCOUNTER — Telehealth: Payer: Medicare HMO | Admitting: Physician Assistant

## 2022-01-07 DIAGNOSIS — Z9081 Acquired absence of spleen: Secondary | ICD-10-CM

## 2022-01-07 DIAGNOSIS — J329 Chronic sinusitis, unspecified: Secondary | ICD-10-CM

## 2022-01-07 NOTE — Progress Notes (Signed)
Autumn Walker  We are sorry to hear you are still sick. It is our department policy that when first-line treatments have failed that you seek in person evaluation for a better evaluation to make sure nothing more severe is going on.   We do apologize for any inconveniences.   NOTE: There will be NO CHARGE for this eVisit   If you are having a true medical emergency please call 911.      For an urgent face to face visit, [facility] has seven urgent care centers for your convenience:     [facility] [place] [facility] [place] Get Driving Directions [phone] [place], [place] [address]    [facility]) Get Driving Directions [phone] [place], [place] [address]  [facility] [place] ([place]) Get Driving Directions [phone] [address] [place],  [place]  [address]  [facility] - at [address] Get Driving Directions  [phone] [address] [place],  [place] [address]   [facility] Get Driving Directions [phone] [place], [place], [place] [address]   [facility] Get Driving Directions  [phone] [address] [place], [place] [address]   [facility] [place] Get Driving Directions [phone] [address], [place], [place] [address]  Your MyChart E-visit questionnaire answers were reviewed by a board certified advanced clinical practitioner to complete your personal care plan based on your specific symptoms.  Thank you for using e-Visits.   I provided 5 minutes of non face-to-face time during this encounter for chart review and documentation.

## 2022-01-11 ENCOUNTER — Other Ambulatory Visit: Payer: Self-pay | Admitting: Physician Assistant

## 2022-01-11 DIAGNOSIS — B0229 Other postherpetic nervous system involvement: Secondary | ICD-10-CM

## 2022-01-12 MED ORDER — GABAPENTIN 300 MG PO CAPS: ORAL_CAPSULE | ORAL | 0 refills | Status: DC

## 2022-01-30 ENCOUNTER — Other Ambulatory Visit: Payer: Self-pay | Admitting: Physician Assistant

## 2022-01-30 DIAGNOSIS — E781 Pure hyperglyceridemia: Secondary | ICD-10-CM

## 2022-01-30 DIAGNOSIS — E1169 Type 2 diabetes mellitus with other specified complication: Secondary | ICD-10-CM

## 2022-02-16 ENCOUNTER — Encounter (INDEPENDENT_AMBULATORY_CARE_PROVIDER_SITE_OTHER): Payer: Self-pay

## 2022-03-03 ENCOUNTER — Other Ambulatory Visit: Payer: Self-pay | Admitting: Physician Assistant

## 2022-03-03 DIAGNOSIS — E782 Mixed hyperlipidemia: Secondary | ICD-10-CM

## 2022-03-06 ENCOUNTER — Telehealth: Payer: Medicare HMO | Admitting: Physician Assistant

## 2022-03-06 DIAGNOSIS — J069 Acute upper respiratory infection, unspecified: Secondary | ICD-10-CM | POA: Diagnosis not present

## 2022-03-06 DIAGNOSIS — B9689 Other specified bacterial agents as the cause of diseases classified elsewhere: Secondary | ICD-10-CM | POA: Diagnosis not present

## 2022-03-06 DIAGNOSIS — Z9081 Acquired absence of spleen: Secondary | ICD-10-CM | POA: Diagnosis not present

## 2022-03-06 MED ORDER — BENZONATATE 100 MG PO CAPS: 100.0000 mg | ORAL_CAPSULE | Freq: Three times a day (TID) | ORAL | 0 refills | Status: DC | PRN

## 2022-03-06 MED ORDER — AMOXICILLIN-POT CLAVULANATE 875-125 MG PO TABS
1.0000 | ORAL_TABLET | Freq: Two times a day (BID) | ORAL | 0 refills | Status: DC
Start: 2022-03-06 — End: 2022-05-10

## 2022-03-06 MED ORDER — PROMETHAZINE-DM 6.25-15 MG/5ML PO SYRP: 5.0000 mL | ORAL_SOLUTION | Freq: Four times a day (QID) | ORAL | 0 refills | Status: DC | PRN

## 2022-03-06 NOTE — Patient Instructions (Signed)
Elana Alm, thank you for joining Mar Daring, PA-C for today's virtual visit.  While this provider is not your primary care provider (PCP), if your PCP is located in our provider database this encounter information will be shared with them immediately following your visit.  Consent: (Patient) Elana Alm provided verbal consent for this virtual visit at the beginning of the encounter.  Current Medications:  Current Outpatient Medications:    amoxicillin-clavulanate (AUGMENTIN) 875-125 MG tablet, Take 1 tablet by mouth 2 (two) times daily., Disp: 20 tablet, Rfl: 0   benzonatate (TESSALON) 100 MG capsule, Take 1 capsule (100 mg total) by mouth 3 (three) times daily as needed., Disp: 30 capsule, Rfl: 0   promethazine-dextromethorphan (PROMETHAZINE-DM) 6.25-15 MG/5ML syrup, Take 5 mLs by mouth 4 (four) times daily as needed., Disp: 118 mL, Rfl: 0   albuterol (VENTOLIN HFA) 108 (90 Base) MCG/ACT inhaler, INHALE 1-2 PUFFS INTO THE LUNGS EVERY 4 (FOUR) HOURS AS NEEDED FOR WHEEZING OR SHORTNESS OF BREATH., Disp: 18 each, Rfl: 0   ezetimibe (ZETIA) 10 MG tablet, TAKE 1 TABLET (10 MG TOTAL) BY MOUTH DAILY AFTER SUPPER., Disp: 90 tablet, Rfl: 0   fluticasone (FLONASE) 50 MCG/ACT nasal spray, USE 1 SPRAY IN EACH NOSTRIL TWICE DAILY FOLLOWING SINUS RINSES, Disp: 48 mL, Rfl: 0   gabapentin (NEURONTIN) 300 MG capsule, TAKE 1 CAPSULE IN AM, 1 CAPSULE IN AFTERNOON AND 2 CAPSULES AT BEDTIME, Disp: 360 capsule, Rfl: 0   ibuprofen (ADVIL,MOTRIN) 200 MG tablet, Take 600 mg by mouth every 6 (six) hours as needed., Disp: , Rfl:    losartan (COZAAR) 25 MG tablet, TAKE 1 TABLET (25 MG TOTAL) BY MOUTH DAILY., Disp: 90 tablet, Rfl: 0   metFORMIN (GLUCOPHAGE) 1000 MG tablet, TAKE 1 TABLET (1,000 MG TOTAL) BY MOUTH 2 (TWO) TIMES DAILY WITH A MEAL., Disp: 180 tablet, Rfl: 1   Methylcobalamin (B12-ACTIVE PO), Take by mouth., Disp: , Rfl:    methylPREDNISolone (MEDROL) 4 MG TBPK tablet, Take  by mouth as directed for 6 days, Disp: 21 tablet, Rfl: 0   montelukast (SINGULAIR) 10 MG tablet, TAKE 1 TABLET BY MOUTH EVERYDAY AT BEDTIME, Disp: 90 tablet, Rfl: 1   omega-3 acid ethyl esters (LOVAZA) 1 g capsule, Take 2 capsules (2 g total) by mouth 2 (two) times daily., Disp: 360 capsule, Rfl: 1   rosuvastatin (CRESTOR) 20 MG tablet, TAKE 1 TABLET BY MOUTH EVERY DAY, Disp: 90 tablet, Rfl: 0   sulfamethoxazole-trimethoprim (BACTRIM DS) 800-160 MG tablet, Take 1 tablet by mouth 2 (two) times daily., Disp: 14 tablet, Rfl: 0   VITAMIN D PO, Take by mouth. 50000 iu weekly, Disp: , Rfl:    Medications ordered in this encounter:  Meds ordered this encounter  Medications   amoxicillin-clavulanate (AUGMENTIN) 875-125 MG tablet    Sig: Take 1 tablet by mouth 2 (two) times daily.    Dispense:  20 tablet    Refill:  0    Order Specific Question:   Supervising Provider    Answer:   Noemi Chapel [[id number]]   promethazine-dextromethorphan (PROMETHAZINE-DM) 6.25-15 MG/5ML syrup    Sig: Take 5 mLs by mouth 4 (four) times daily as needed.    Dispense:  118 mL    Refill:  0    Order Specific Question:   Supervising Provider    Answer:   MILLER, BRIAN [[id number]]   benzonatate (TESSALON) 100 MG capsule    Sig: Take 1 capsule (100 mg total) by mouth 3 (three) times daily  as needed.    Dispense:  30 capsule    Refill:  0    Order Specific Question:   Supervising Provider    Answer:   Sabra Heck, [place]     *If you need refills on other medications prior to your next appointment, please contact your pharmacy*  Follow-Up: Call back or seek an in-person evaluation if the symptoms worsen or if the condition fails to improve as anticipated.  Other Instructions  Sinus Infection, Adult A sinus infection, also called sinusitis, is inflammation of your sinuses. Sinuses are hollow spaces in the bones around your face. Your sinuses are located: Around your eyes. In the middle of your forehead. Behind your  nose. In your cheekbones. Mucus normally drains out of your sinuses. When your nasal tissues become inflamed or swollen, mucus can become trapped or blocked. This allows bacteria, viruses, and fungi to grow, which leads to infection. Most infections of the sinuses are caused by a virus. A sinus infection can develop quickly. It can last for up to 4 weeks (acute) or for more than 12 weeks (chronic). A sinus infection often develops after a cold. What are the causes? This condition is caused by anything that creates swelling in the sinuses or stops mucus from draining. This includes: Allergies. Asthma. Infection from bacteria or viruses. Deformities or blockages in your nose or sinuses. Abnormal growths in the nose (nasal polyps). Pollutants, such as chemicals or irritants in the air. Infection from fungi. This is rare. What increases the risk? You are more likely to develop this condition if you: Have a weak body defense system (immune system). Do a lot of swimming or diving. Overuse nasal sprays. Smoke. What are the signs or symptoms? The main symptoms of this condition are pain and a feeling of pressure around the affected sinuses. Other symptoms include: Stuffy nose or congestion that makes it difficult to breathe through your nose. Thick yellow or greenish drainage from your nose. Tenderness, swelling, and warmth over the affected sinuses. A cough that may get worse at night. Decreased sense of smell and taste. Extra mucus that collects in the throat or the back of the nose (postnasal drip) causing a sore throat or bad breath. Tiredness (fatigue). Fever. How is this diagnosed? This condition is diagnosed based on: Your symptoms. Your medical history. A physical exam. Tests to find out if your condition is acute or chronic. This may include: Checking your nose for nasal polyps. Viewing your sinuses using a device that has a light (endoscope). Testing for allergies or  bacteria. Imaging tests, such as an MRI or CT scan. In rare cases, a bone biopsy may be done to rule out more serious types of fungal sinus disease. How is this treated? Treatment for a sinus infection depends on the cause and whether your condition is chronic or acute. If caused by a virus, your symptoms should go away on their own within 10 days. You may be given medicines to relieve symptoms. They include: Medicines that shrink swollen nasal passages (decongestants). A spray that eases inflammation of the nostrils (topical intranasal corticosteroids). Rinses that help get rid of thick mucus in your nose (nasal saline washes). Medicines that treat allergies (antihistamines). Over-the-counter pain relievers. If caused by bacteria, your health care provider may recommend waiting to see if your symptoms improve. Most bacterial infections will get better without antibiotic medicine. You may be given antibiotics if you have: A severe infection. A weak immune system. If caused by narrow nasal  passages or nasal polyps, surgery may be needed. Follow these instructions at home: Medicines Take, use, or apply over-the-counter and prescription medicines only as told by your health care provider. These may include nasal sprays. If you were prescribed an antibiotic medicine, take it as told by your health care provider. Do not stop taking the antibiotic even if you start to feel better. Hydrate and humidify  Drink enough fluid to keep your urine pale yellow. Staying hydrated will help to thin your mucus. Use a cool mist humidifier to keep the humidity level in your home above 50%. Inhale steam for 10-15 minutes, 3-4 times a day, or as told by your health care provider. You can do this in the bathroom while a hot shower is running. Limit your exposure to cool or dry air. Rest Rest as much as possible. Sleep with your head raised (elevated). Make sure you get enough sleep each night. General  instructions  Apply a warm, moist washcloth to your face 3-4 times a day or as told by your health care provider. This will help with discomfort. Use nasal saline washes as often as told by your health care provider. Wash your hands often with soap and water to reduce your exposure to germs. If soap and water are not available, use hand sanitizer. Do not smoke. Avoid being around people who are smoking (secondhand smoke). Keep all follow-up visits. This is important. Contact a health care provider if: You have a fever. Your symptoms get worse. Your symptoms do not improve within 10 days. Get help right away if: You have a severe headache. You have persistent vomiting. You have severe pain or swelling around your face or eyes. You have vision problems. You develop confusion. Your neck is stiff. You have trouble breathing. These symptoms may be an emergency. Get help right away. Call 911. Do not wait to see if the symptoms will go away. Do not drive yourself to the hospital. Summary A sinus infection is soreness and inflammation of your sinuses. Sinuses are hollow spaces in the bones around your face. This condition is caused by nasal tissues that become inflamed or swollen. The swelling traps or blocks the flow of mucus. This allows bacteria, viruses, and fungi to grow, which leads to infection. If you were prescribed an antibiotic medicine, take it as told by your health care provider. Do not stop taking the antibiotic even if you start to feel better. Keep all follow-up visits. This is important. This information is not intended to replace advice given to you by your health care provider. Make sure you discuss any questions you have with your health care provider. Document Revised: 06/01/2021 Document Reviewed: 06/01/2021 Elsevier Patient Education  [place].   Acute Bronchitis, Adult  Acute bronchitis is sudden inflammation of the main airways (bronchi) that come off the  windpipe (trachea) in the lungs. The swelling causes the airways to get smaller and make more mucus than normal. This can make it hard to breathe and can cause coughing or noisy breathing (wheezing). Acute bronchitis may last several weeks. The cough may last longer. Allergies, asthma, and exposure to smoke may make the condition worse. What are the causes? This condition can be caused by germs and by substances that irritate the lungs, including: Cold and flu viruses. The most common cause of this condition is the virus that causes the common cold. Bacteria. This is less common. Breathing in substances that irritate the lungs, including: Smoke from cigarettes and other  forms of tobacco. Dust and pollen. Fumes from household cleaning products, gases, or burned fuel. Indoor or outdoor air pollution. What increases the risk? The following factors may make you more likely to develop this condition: A weak body's defense system, also called the immune system. A condition that affects your lungs and breathing, such as asthma. What are the signs or symptoms? Common symptoms of this condition include: Coughing. This may bring up clear, yellow, or green mucus from your lungs (sputum). Wheezing. Runny or stuffy nose. Having too much mucus in your lungs (chest congestion). Shortness of breath. Aches and pains, including sore throat or chest. How is this diagnosed? This condition is usually diagnosed based on: Your symptoms and medical history. A physical exam. You may also have other tests, including tests to rule out other conditions, such as pneumonia. These tests include: A test of lung function. Test of a mucus sample to look for the presence of bacteria. Tests to check the oxygen level in your blood. Blood tests. Chest X-ray. How is this treated? Most cases of acute bronchitis clear up over time without treatment. Your health care provider may recommend: Drinking more fluids to help  thin your mucus so it is easier to cough up. Taking inhaled medicine (inhaler) to improve air flow in and out of your lungs. Using a vaporizer or a humidifier. These are machines that add water to the air to help you breathe better. Taking a medicine that thins mucus and clears congestion (expectorant). Taking a medicine that prevents or stops coughing (cough suppressant). It is not common to take an antibiotic medicine for this condition. Follow these instructions at home:  Take over-the-counter and prescription medicines only as told by your health care provider. Use an inhaler, vaporizer, or humidifier as told by your health care provider. Take two teaspoons (10 mL) of honey at bedtime to lessen coughing at night. Drink enough fluid to keep your urine pale yellow. Do not use any products that contain nicotine or tobacco. These products include cigarettes, chewing tobacco, and vaping devices, such as e-cigarettes. If you need help quitting, ask your health care provider. Get plenty of rest. Return to your normal activities as told by your health care provider. Ask your health care provider what activities are safe for you. Keep all follow-up visits. This is important. How is this prevented? To lower your risk of getting this condition again: Wash your hands often with soap and water for at least 20 seconds. If soap and water are not available, use hand sanitizer. Avoid contact with people who have cold symptoms. Try not to touch your mouth, nose, or eyes with your hands. Avoid breathing in smoke or chemical fumes. Breathing smoke or chemical fumes will make your condition worse. Get the flu shot every year. Contact a health care provider if: Your symptoms do not improve after 2 weeks. You have trouble coughing up the mucus. Your cough keeps you awake at night. You have a fever. Get help right away if you: Cough up blood. Feel pain in your chest. Have severe shortness of  breath. Faint or keep feeling like you are going to faint. Have a severe headache. Have a fever or chills that get worse. These symptoms may represent a serious problem that is an emergency. Do not wait to see if the symptoms will go away. Get medical help right away. Call your local emergency services (911 in the [place] Do not drive yourself to the hospital. Summary Acute bronchitis is  inflammation of the main airways (bronchi) that come off the windpipe (trachea) in the lungs. The swelling causes the airways to get smaller and make more mucus than normal. Drinking more fluids can help thin your mucus so it is easier to cough up. Take over-the-counter and prescription medicines only as told by your health care provider. Do not use any products that contain nicotine or tobacco. These products include cigarettes, chewing tobacco, and vaping devices, such as e-cigarettes. If you need help quitting, ask your health care provider. Contact a health care provider if your symptoms do not improve after 2 weeks. This information is not intended to replace advice given to you by your health care provider. Make sure you discuss any questions you have with your health care provider. Document Revised: 10/07/2021 Document Reviewed: 10/28/2020 Elsevier Patient Education  [place].    If you have been instructed to have an in-person evaluation today at a local Urgent Care facility, please use the link below. It will take you to a list of all of our available [facility], including address, phone number and hours of operation. Please do not delay care.  [facility]  If you or a family member do not have a primary care provider, use the link below to schedule a visit and establish care. When you choose a [facility] primary care physician or advanced practice provider, you gain a long-term partner in health. Find a Primary Care Provider  Learn more about [facility]'s  in-office and virtual care options: [facility]
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
## 2022-03-30 ENCOUNTER — Other Ambulatory Visit: Payer: Self-pay | Admitting: Physician Assistant

## 2022-03-30 DIAGNOSIS — E1100 Type 2 diabetes mellitus with hyperosmolarity without nonketotic hyperglycemic-hyperosmolar coma (NKHHC): Secondary | ICD-10-CM

## 2022-04-28 ENCOUNTER — Other Ambulatory Visit: Payer: Self-pay | Admitting: Physician Assistant

## 2022-04-28 DIAGNOSIS — E785 Hyperlipidemia, unspecified: Secondary | ICD-10-CM

## 2022-04-28 DIAGNOSIS — E1169 Type 2 diabetes mellitus with other specified complication: Secondary | ICD-10-CM

## 2022-04-28 DIAGNOSIS — E781 Pure hyperglyceridemia: Secondary | ICD-10-CM

## 2022-04-28 DIAGNOSIS — J3089 Other allergic rhinitis: Secondary | ICD-10-CM

## 2022-05-08 ENCOUNTER — Other Ambulatory Visit: Payer: Self-pay | Admitting: Physician Assistant

## 2022-05-08 ENCOUNTER — Encounter: Payer: Self-pay | Admitting: Physician Assistant

## 2022-05-08 DIAGNOSIS — B0229 Other postherpetic nervous system involvement: Secondary | ICD-10-CM

## 2022-05-10 ENCOUNTER — Other Ambulatory Visit (INDEPENDENT_AMBULATORY_CARE_PROVIDER_SITE_OTHER): Payer: Medicare HMO

## 2022-05-10 ENCOUNTER — Ambulatory Visit (INDEPENDENT_AMBULATORY_CARE_PROVIDER_SITE_OTHER): Payer: Medicare HMO | Admitting: Physician Assistant

## 2022-05-10 ENCOUNTER — Encounter: Payer: Self-pay | Admitting: Physician Assistant

## 2022-05-10 VITALS — BP 107/66 | HR 88 | Wt 126.1 lb

## 2022-05-10 VITALS — Ht 61.5 in | Wt 126.6 lb

## 2022-05-10 DIAGNOSIS — B0229 Other postherpetic nervous system involvement: Secondary | ICD-10-CM

## 2022-05-10 DIAGNOSIS — E1169 Type 2 diabetes mellitus with other specified complication: Secondary | ICD-10-CM | POA: Diagnosis not present

## 2022-05-10 DIAGNOSIS — E785 Hyperlipidemia, unspecified: Secondary | ICD-10-CM

## 2022-05-10 DIAGNOSIS — Z7984 Long term (current) use of oral hypoglycemic drugs: Secondary | ICD-10-CM | POA: Diagnosis not present

## 2022-05-10 DIAGNOSIS — E1100 Type 2 diabetes mellitus with hyperosmolarity without nonketotic hyperglycemic-hyperosmolar coma (NKHHC): Secondary | ICD-10-CM

## 2022-05-10 DIAGNOSIS — J329 Chronic sinusitis, unspecified: Secondary | ICD-10-CM | POA: Diagnosis not present

## 2022-05-10 LAB — POCT GLYCOSYLATED HEMOGLOBIN (HGB A1C): HbA1c POC (<> result, manual entry): 6 % (ref 4.0–5.6)

## 2022-05-10 MED ORDER — DOXYCYCLINE HYCLATE 100 MG PO TABS
100.0000 mg | ORAL_TABLET | Freq: Two times a day (BID) | ORAL | 0 refills | Status: DC
Start: 2022-05-10 — End: 2022-09-13

## 2022-05-10 MED ORDER — GABAPENTIN 300 MG PO CAPS: ORAL_CAPSULE | ORAL | 0 refills | Status: DC
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
## 2022-05-31 ENCOUNTER — Other Ambulatory Visit: Payer: Self-pay | Admitting: Physician Assistant

## 2022-05-31 DIAGNOSIS — E782 Mixed hyperlipidemia: Secondary | ICD-10-CM

## 2022-08-27 ENCOUNTER — Other Ambulatory Visit: Payer: Self-pay | Admitting: Nurse Practitioner

## 2022-08-27 DIAGNOSIS — E782 Mixed hyperlipidemia: Secondary | ICD-10-CM

## 2022-09-02 ENCOUNTER — Telehealth: Payer: Self-pay | Admitting: *Deleted

## 2022-09-02 DIAGNOSIS — E1100 Type 2 diabetes mellitus with hyperosmolarity without nonketotic hyperglycemic-hyperosmolar coma (NKHHC): Secondary | ICD-10-CM

## 2022-09-02 DIAGNOSIS — B0229 Other postherpetic nervous system involvement: Secondary | ICD-10-CM

## 2022-09-02 MED ORDER — GABAPENTIN 300 MG PO CAPS: ORAL_CAPSULE | ORAL | 0 refills | Status: DC

## 2022-09-02 MED ORDER — LOSARTAN POTASSIUM 25 MG PO TABS: 25.0000 mg | ORAL_TABLET | Freq: Every day | ORAL | 0 refills | Status: DC

## 2022-09-02 NOTE — Telephone Encounter (Signed)
Pt calling to schedule follow up visit and she said that she will need refill on 2 of her medication, she said she is completely out of these.    gabapentin (NEURONTIN) 300 MG capsule    losartan (COZAAR) 25 MG tablet        [id number] [place], [place] - [address]

## 2022-09-02 NOTE — Telephone Encounter (Signed)
L.O.V: 05/10/22  N.O.V: 09/13/22  L.R.F: 03/30/22 Losartan 90 tab 0 refill            05/10/22 Gabapentin 360 cap 0 refill    [facility]

## 2022-09-12 NOTE — Progress Notes (Unsigned)
Established Patient Office Visit  Subjective   Patient ID: Autumn Walker, female    DOB: [birth year]  Age: 72 y.o. MRN: [id number]  No chief complaint on file.   HPI Autumn Walker is a 72 y.o. female presenting today for follow up of ***  ROS Negative unless otherwise noted in HPI   Objective:     There were no vitals taken for this visit.  Physical Exam   No results found for any visits on 09/13/22.  {Labs (Optional):23779}  The ASCVD Risk score (Arnett DK, et al., 2019) failed to calculate for the following reasons:   The valid total cholesterol range is 130 to 320 mg/dL    Assessment & Plan:  There are no diagnoses linked to this encounter.  No follow-ups on file.    [name], PA

## 2022-09-13 ENCOUNTER — Ambulatory Visit (INDEPENDENT_AMBULATORY_CARE_PROVIDER_SITE_OTHER): Payer: Medicare HMO | Admitting: Family Medicine

## 2022-09-13 ENCOUNTER — Encounter: Payer: Self-pay | Admitting: Family Medicine

## 2022-09-13 VITALS — BP 118/71 | HR 99 | Resp 18 | Ht 61.5 in | Wt 129.0 lb

## 2022-09-13 DIAGNOSIS — Z720 Tobacco use: Secondary | ICD-10-CM

## 2022-09-13 DIAGNOSIS — F17200 Nicotine dependence, unspecified, uncomplicated: Secondary | ICD-10-CM

## 2022-09-13 DIAGNOSIS — E782 Mixed hyperlipidemia: Secondary | ICD-10-CM

## 2022-09-13 DIAGNOSIS — E1169 Type 2 diabetes mellitus with other specified complication: Secondary | ICD-10-CM | POA: Diagnosis not present

## 2022-09-13 DIAGNOSIS — E559 Vitamin D deficiency, unspecified: Secondary | ICD-10-CM | POA: Diagnosis not present

## 2022-09-13 DIAGNOSIS — Z716 Tobacco abuse counseling: Secondary | ICD-10-CM

## 2022-09-13 DIAGNOSIS — E1100 Type 2 diabetes mellitus with hyperosmolarity without nonketotic hyperglycemic-hyperosmolar coma (NKHHC): Secondary | ICD-10-CM | POA: Diagnosis not present

## 2022-09-13 DIAGNOSIS — Z23 Encounter for immunization: Secondary | ICD-10-CM

## 2022-09-13 DIAGNOSIS — R7989 Other specified abnormal findings of blood chemistry: Secondary | ICD-10-CM

## 2022-09-13 DIAGNOSIS — R011 Cardiac murmur, unspecified: Secondary | ICD-10-CM | POA: Insufficient documentation

## 2022-09-13 LAB — POCT UA - MICROALBUMIN
Creatinine, POC: 100 mg/dL
Microalbumin Ur, POC: 80 mg/L

## 2022-09-13 MED ORDER — ROSUVASTATIN CALCIUM 20 MG PO TABS: 20.0000 mg | ORAL_TABLET | Freq: Every day | ORAL | 1 refills | Status: DC

## 2022-09-13 NOTE — Assessment & Plan Note (Addendum)
Most recent lipid panel done in 2022.  Repeating lipid panel today.  Continue rosuvastatin 20 mg and Zetia 10 mg unless results warrant change in management.  Will continue to monitor.

## 2022-09-13 NOTE — Patient Instructions (Signed)
Cologuard Information for Patients Entities using this material are responsible for and should pay close attention to the accuracy of the information about their company and the coverage details that they distribute via these materials or otherwise. This template is provided to any appropriately requesting entity, regardless of the manner in which they cover, recommend, or participate in the ordering of Cologuard. You may adapt the provided content to suit the needs of your organization, including the removal of the [employer]. However, do not remove or modify the "Important Information" section.   COLOGUARD INFORMATION Based on advances in stool DNA science, Cologuard is an easy-to-use screening option for colorectal cancer. This simple, noninvasive option is available by prescription only to adults 29 years of age and older who are at average risk of colorectal cancer.  American Cancer Society guidelines recommend that people at average risk of colorectal cancer start screening at age 41.1 For these individuals who are screening with Cologuard, The American Cancer Society recommends screening again three years after a negative Cologuard result. Talk with your health care provider about the available options for screening.  How Does Cologuard Work? Cologuard identifies specific biomarkers associated with colorectal cancer and precancer by using advanced stool DNA technology that detects altered DNA and/or blood in the stool. Cologuard finds 92% of colorectal cancers, even in earlier stages.2  How Is a Sample Collected? The Cologuard collection kit is easy to use and shipped directly to your home. You don't need to take time off or make any changes to diet or medication before using the test; you simply collect a single stool sample in the privacy of your own home. Plan to collect your sample when you can get it back to [employer] that same day or the next day. Send it back to [employer] using the no-cost return option that works best for you. You can ask for a contact free pickup by calling [phone] for help or visit [email or web address] to schedule it on your own or drop it off at [place].  [facility] will send the results to your health care provider in a few weeks.   Is Cologuard Affordable? Cologuard is covered by [employer] and most major Engineer, water.3 [employer], about 94% of patients using Cologuard have no out-of-pocket cost for screening.3*  What Should I Know About the Results of My Cologuard Test? [facility] sends your results to your health care provider. If your test result is4: Negative The test did not detect altered DNA and/or blood that could be caused by a precancerous polyp or colon cancer. A negative result means you have a very low chance of having colorectal cancer or pre-cancer, and you do not need further follow-up at this time. However, false negatives can occur. You should get screened again in the future. Positive The test detected altered DNA and/or blood that could be caused by a precancerous polyp or colon cancer. A positive result means you have an increased chance of having colorectal cancer or pre-cancer. You do not necessarily have cancer, because false positives can occur. You should discuss your result with your ordering provider and schedule a colonoscopy as soon as possible. Indications and Important Risk Information Cologuard is intended to screen adults 27 years of age and older who are at average risk for colorectal cancer by detecting certain DNA markers and blood in the stool. Do not use if you have had adenomas, have inflammatory bowel disease and certain hereditary syndromes, or a personal  or family history of colorectal cancer. Cologuard is not a replacement for a diagnostic colonoscopy in high risk patients. Cologuard performance in adults ages 2-49 is estimated based on a  large clinical study of patients 57 and older.  Cologuard performance in repeat testing has not been evaluated.  The Cologuard test result should be interpreted with caution. A positive test result does not confirm the presence of cancer. Patients with a positive test result should be referred for colonoscopy. A negative test result does not confirm the absence of cancer. Patients with a negative test result should discuss with their doctor when they need to be tested again. False positives and false negative results can occur. In a clinical study, 13% of people without cancer received a positive result (false positive) and 8% of people with cancer received a negative result (false negative). Rx only.      References: 1. Wolf AMD, Fontham ETH, Church TR, et al. Colorectal cancer screening for average-risk adults: 2018 guideline update from the [employer]. [place] Cancer J Clin. 2018;68(4):250-281. 2. Imperiale TF, Ransohoff DF, Itzkowitz SH, et al. Multitarget stool DNA testing for colorectal-cancer screening. N Engl J Med. 2014;370(14):1287-1297. [id number] 3. Internal Data on File. [place]. [place], [place]. [place]. [place], [place]. Accessed January 07, 2021.   *[facility] estimate based on historical patient billing. Rate of coverage varies by state and region. Exceptions for coverage may apply, only your insurer can confirm how Cologuard would be covered.    Cologuard is a [employer]. All other trademarks are the properties of their respective owners.  [place]. All rights reserved. [id number] May 2023.

## 2022-09-13 NOTE — Assessment & Plan Note (Addendum)
A1c today.  Continue metformin 1000 mg twice daily.  Recommended following a low-carb, low glucose diet.  Also taking losartan to protect kidney function.  Urine microalbumin collected today.  Will continue to monitor.

## 2022-09-13 NOTE — Addendum Note (Signed)
Addended by: Gemma Payor on: 09/13/2022 09:37 AM   Modules accepted: Orders

## 2022-09-13 NOTE — Assessment & Plan Note (Signed)
Stable.  Continue losartan 25 mg daily.  Collecting CMP today for electrolytes and kidney function.  Will continue to monitor.

## 2022-09-13 NOTE — Assessment & Plan Note (Signed)
Currently smoking half a pack per day.  Counseled on tobacco cessation and the dangers of tobacco use, patient is not interested in quitting at this time.  We discussed that there are options to support her if she does decide to quit.  Patient expressed understanding.

## 2022-09-13 NOTE — Assessment & Plan Note (Signed)
First heard by [facility] in 2021, echocardiogram at the time showed mild aortic regurgitation with an indeterminate number of aortic cusps, trivial tricuspid regurg, and trivial mitral regurg.  Patient denies cardiac symptoms including palpitations, chest pain, shortness of breath, dyspnea.  Will continue to monitor.

## 2022-09-14 ENCOUNTER — Encounter: Payer: Self-pay | Admitting: Family Medicine

## 2022-09-14 LAB — COMPREHENSIVE METABOLIC PANEL
ALT: 13 IU/L (ref 0–32)
AST: 20 IU/L (ref 0–40)
Albumin/Globulin Ratio: 2.1 (ref 1.2–2.2)
Albumin: 5.1 g/dL — ABNORMAL HIGH (ref 3.9–4.9)
Alkaline Phosphatase: 55 IU/L (ref 44–121)
BUN/Creatinine Ratio: 15 (ref 12–28)
BUN: 15 mg/dL (ref 8–27)
Bilirubin Total: <0.2 mg/dL (ref 0.0–1.2)
CO2: 22 mmol/L (ref 20–29)
Calcium: 9.9 mg/dL (ref 8.7–10.3)
Chloride: 101 mmol/L (ref 96–106)
Creatinine, Ser: 1.02 mg/dL — ABNORMAL HIGH (ref 0.57–1.00)
Globulin, Total: 2.4 g/dL (ref 1.5–4.5)
Glucose: 149 mg/dL — ABNORMAL HIGH (ref 70–99)
Potassium: 4.8 mmol/L (ref 3.5–5.2)
Sodium: 140 mmol/L (ref 134–144)
Total Protein: 7.5 g/dL (ref 6.0–8.5)
eGFR: 59 mL/min/{1.73_m2} — ABNORMAL LOW (ref >59)

## 2022-09-14 LAB — CBC WITH DIFFERENTIAL/PLATELET
Basophils Absolute: 0.1 10*3/uL (ref 0.0–0.2)
Basos: 1 %
EOS (ABSOLUTE): 0.4 10*3/uL (ref 0.0–0.4)
Eos: 3 %
Hematocrit: 42.5 % (ref 34.0–46.6)
Hemoglobin: 14.6 g/dL (ref 11.1–15.9)
Immature Grans (Abs): 0 10*3/uL (ref 0.0–0.1)
Immature Granulocytes: 0 %
Lymphocytes Absolute: 5.7 10*3/uL — ABNORMAL HIGH (ref 0.7–3.1)
Lymphs: 41 %
MCH: 32.9 pg (ref 26.6–33.0)
MCHC: 34.4 g/dL (ref 31.5–35.7)
MCV: 96 fL (ref 79–97)
Monocytes Absolute: 1.2 10*3/uL — ABNORMAL HIGH (ref 0.1–0.9)
Monocytes: 9 %
Neutrophils Absolute: 6.5 10*3/uL (ref 1.4–7.0)
Neutrophils: 46 %
Platelets: 335 10*3/uL (ref 150–450)
RBC: 4.44 x10E6/uL (ref 3.77–5.28)
RDW: 12.2 % (ref 11.7–15.4)
WBC: 13.9 10*3/uL — ABNORMAL HIGH (ref 3.4–10.8)

## 2022-09-14 LAB — LIPID PANEL
Chol/HDL Ratio: 2.4 ratio (ref 0.0–4.4)
Cholesterol, Total: 111 mg/dL (ref 100–199)
HDL: 47 mg/dL (ref >39)
LDL Chol Calc (NIH): 40 mg/dL (ref 0–99)
Triglycerides: 142 mg/dL (ref 0–149)
VLDL Cholesterol Cal: 24 mg/dL (ref 5–40)

## 2022-09-14 LAB — VITAMIN D 25 HYDROXY (VIT D DEFICIENCY, FRACTURES): Vit D, 25-Hydroxy: 105 ng/mL — ABNORMAL HIGH (ref 30.0–100.0)

## 2022-09-14 LAB — T4, FREE: Free T4: 1.23 ng/dL (ref 0.82–1.77)

## 2022-09-14 LAB — HEMOGLOBIN A1C
Est. average glucose Bld gHb Est-mCnc: 143 mg/dL
Hgb A1c MFr Bld: 6.6 % — ABNORMAL HIGH (ref 4.8–5.6)

## 2022-09-14 LAB — TSH: TSH: 1.97 u[IU]/mL (ref 0.450–4.500)

## 2022-09-14 LAB — T3, FREE: T3, Free: 3.6 pg/mL (ref 2.0–4.4)

## 2022-10-30 ENCOUNTER — Other Ambulatory Visit: Payer: Self-pay | Admitting: Nurse Practitioner

## 2022-10-30 ENCOUNTER — Other Ambulatory Visit: Payer: Self-pay | Admitting: Family Medicine

## 2022-10-30 DIAGNOSIS — E782 Mixed hyperlipidemia: Secondary | ICD-10-CM

## 2022-10-30 DIAGNOSIS — E1100 Type 2 diabetes mellitus with hyperosmolarity without nonketotic hyperglycemic-hyperosmolar coma (NKHHC): Secondary | ICD-10-CM

## 2022-11-06 IMAGING — MG MM DIGITAL SCREENING BILAT W/ TOMO AND CAD
6 of 10 series · 6 of 30 positions shown · non-contrast
Comparison: Previous exam(s).

ACR Breast Density Category a: The breast tissue is almost entirely
fatty.

CLINICAL DATA: Screening.

EXAM:
DIGITAL SCREENING BILATERAL MAMMOGRAM WITH TOMOSYNTHESIS AND CAD
TECHNIQUE: Bilateral screening digital craniocaudal and mediolateral oblique
mammograms were obtained. Bilateral screening digital breast
tomosynthesis was performed. The images were evaluated with
computer-aided detection.

[L MLO synth-2D (1 of 2)]
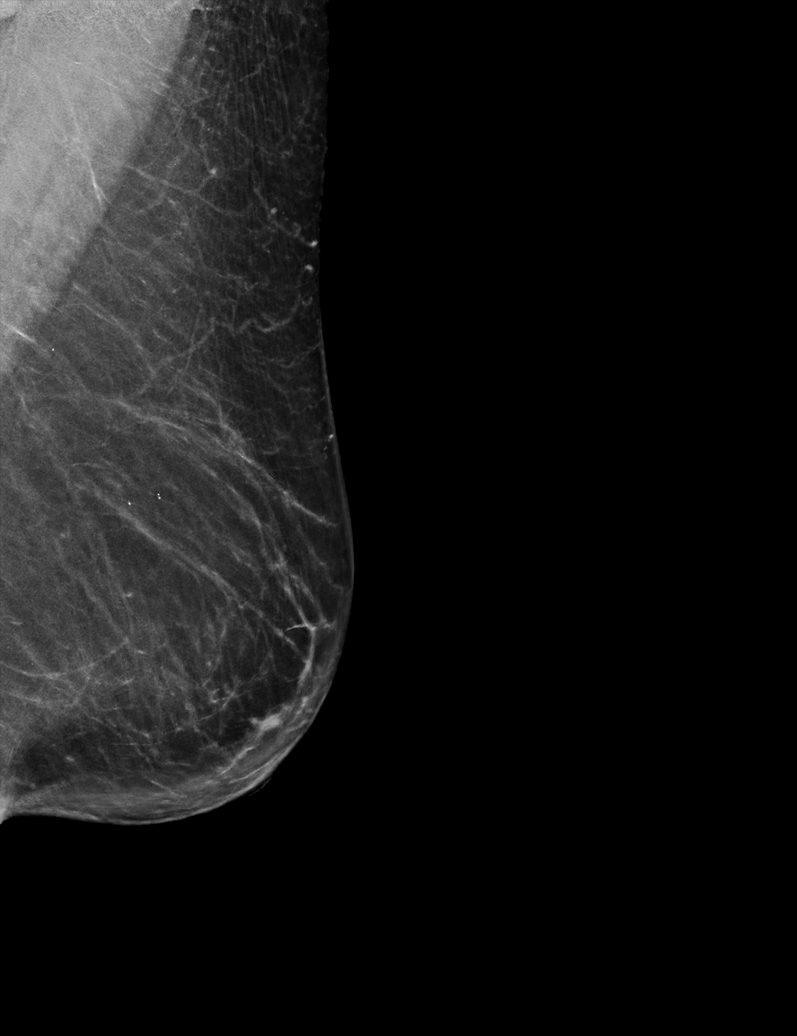

[R CC synth-2D]
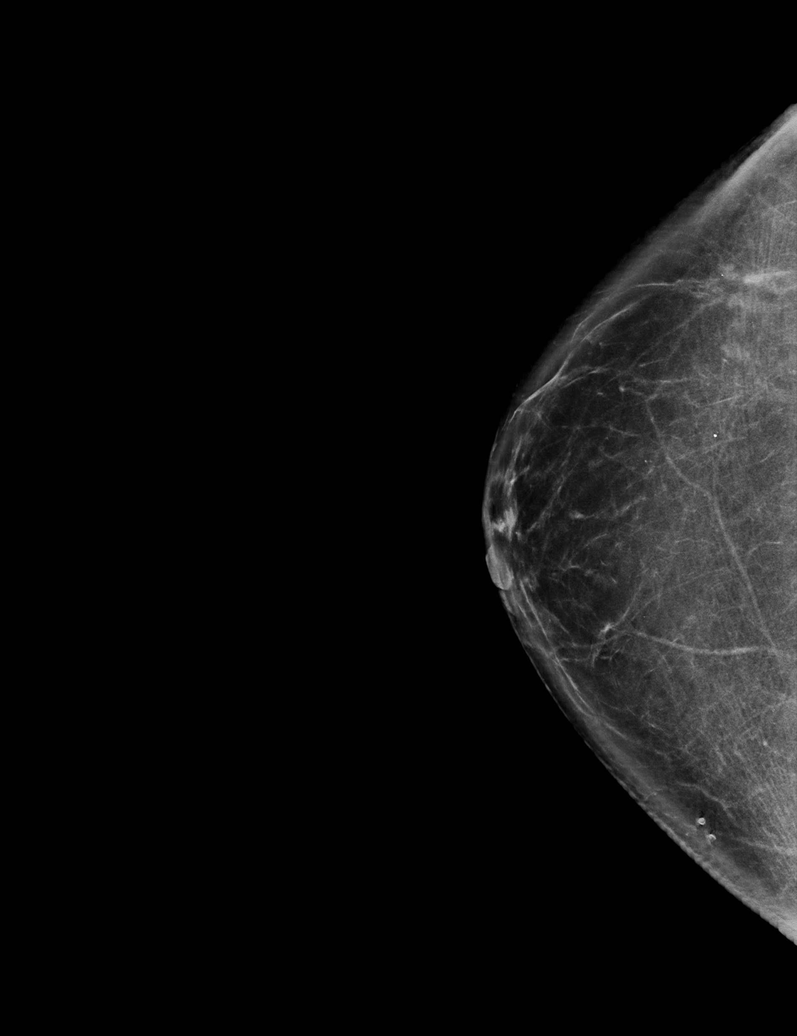

[L CC synth-2D]
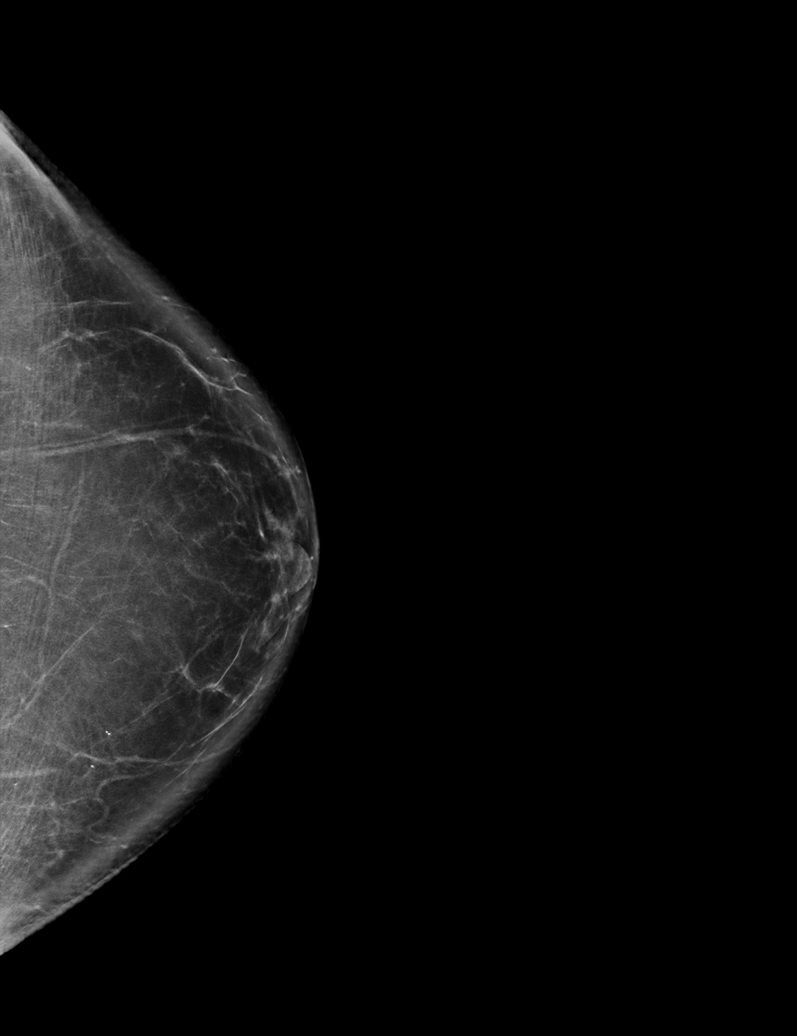

[L MLO synth-2D (2 of 2)]
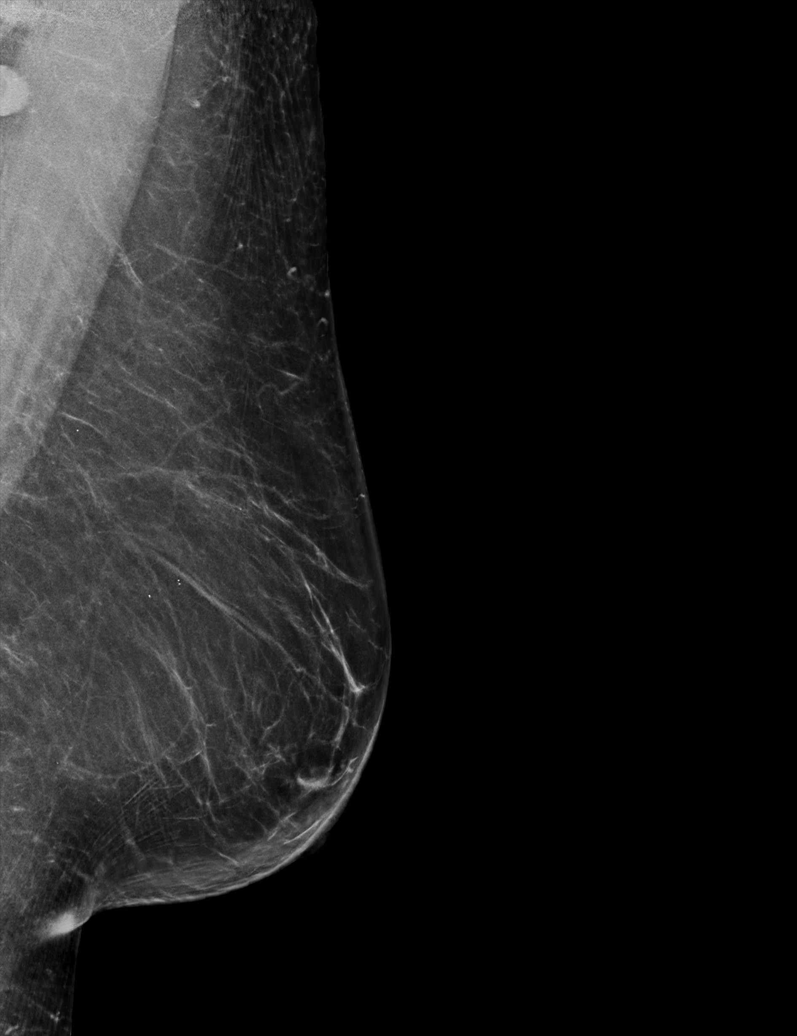

[R MLO synth-2D]
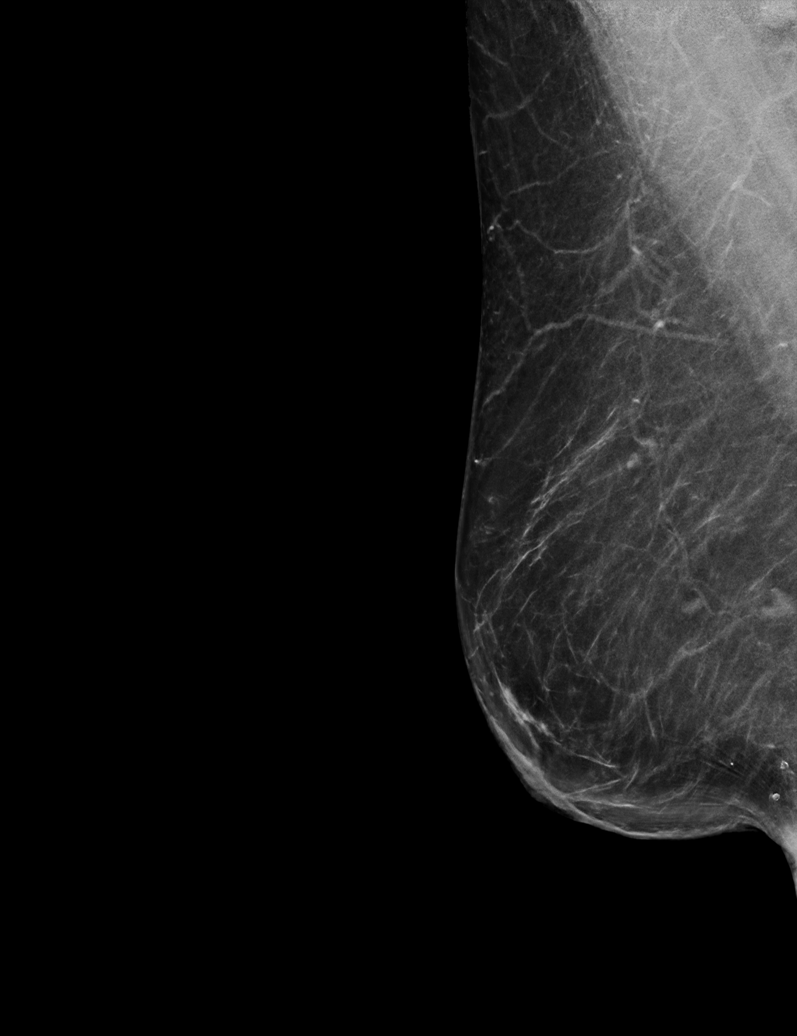

[R MLO tomo · tomo slice 35/70.0]
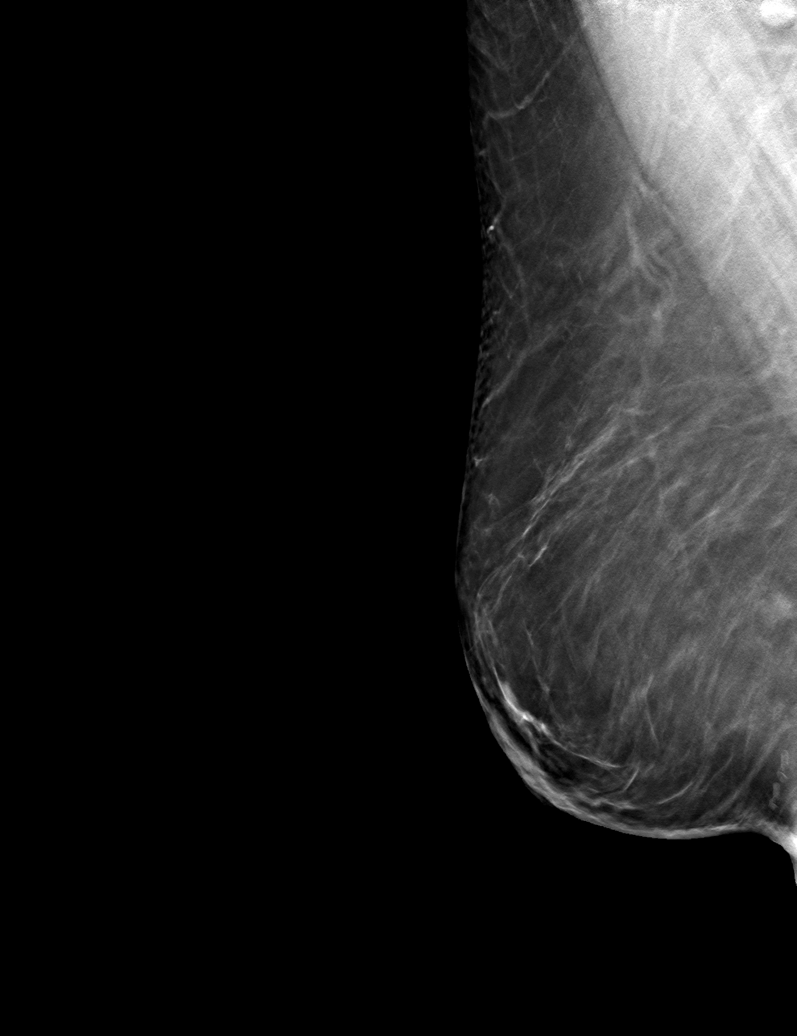

[6 of 30 positions shown; findings below may reference images not displayed]

FINDINGS: There are no findings suspicious for malignancy.
IMPRESSION: No mammographic evidence of malignancy. A result letter of this
screening mammogram will be mailed directly to the patient.

RECOMMENDATION:
Screening mammogram in one year. (Code:0E-[id number])

BI-RADS CATEGORY  1: Negative.

## 2022-11-21 ENCOUNTER — Telehealth: Payer: Self-pay

## 2022-11-21 NOTE — Telephone Encounter (Signed)
Faxed and confirmation recieved

## 2022-11-21 NOTE — Telephone Encounter (Signed)
Pt brought a verification of chronic condition form by the office. Pt completed her portion the remainder is to be checked off and provider signature and date.  Please fax back to [phone]

## 2022-11-21 NOTE — Telephone Encounter (Signed)
Form completed and will be faxed as requested.

## 2022-12-28 ENCOUNTER — Other Ambulatory Visit: Payer: Self-pay | Admitting: Family Medicine

## 2022-12-28 DIAGNOSIS — D72829 Elevated white blood cell count, unspecified: Secondary | ICD-10-CM

## 2022-12-28 DIAGNOSIS — E559 Vitamin D deficiency, unspecified: Secondary | ICD-10-CM

## 2022-12-28 DIAGNOSIS — E782 Mixed hyperlipidemia: Secondary | ICD-10-CM

## 2022-12-28 DIAGNOSIS — R7989 Other specified abnormal findings of blood chemistry: Secondary | ICD-10-CM

## 2023-01-06 ENCOUNTER — Telehealth: Payer: Self-pay | Admitting: *Deleted

## 2023-01-06 ENCOUNTER — Other Ambulatory Visit: Payer: Medicare HMO

## 2023-01-06 DIAGNOSIS — E559 Vitamin D deficiency, unspecified: Secondary | ICD-10-CM

## 2023-01-06 DIAGNOSIS — R7989 Other specified abnormal findings of blood chemistry: Secondary | ICD-10-CM | POA: Diagnosis not present

## 2023-01-06 DIAGNOSIS — E1169 Type 2 diabetes mellitus with other specified complication: Secondary | ICD-10-CM

## 2023-01-06 DIAGNOSIS — B0229 Other postherpetic nervous system involvement: Secondary | ICD-10-CM

## 2023-01-06 DIAGNOSIS — E782 Mixed hyperlipidemia: Secondary | ICD-10-CM | POA: Diagnosis not present

## 2023-01-06 DIAGNOSIS — D72829 Elevated white blood cell count, unspecified: Secondary | ICD-10-CM

## 2023-01-06 MED ORDER — GABAPENTIN 300 MG PO CAPS
ORAL_CAPSULE | ORAL | 0 refills | Status: DC
Start: 2023-01-06 — End: 2023-04-26

## 2023-01-06 NOTE — Telephone Encounter (Signed)
Refill sent.

## 2023-01-06 NOTE — Telephone Encounter (Signed)
Prescription Request  01/06/2023  LOV: 09/13/22 ROV: 01/17/23 What is the name of the medication or equipment? Gabapentin- patient states she is out   Have you contacted your pharmacy to request a refill? No   Which pharmacy would you like this sent to?  [id number] [place], [place] - [address]  [place] [address] Phone: [phone] Fax: [phone]     Patient notified that their request is being sent to the clinical staff for review and that they should receive a response within 2 business days.

## 2023-01-07 LAB — LIPID PANEL
Chol/HDL Ratio: 2.5 ratio (ref 0.0–4.4)
Cholesterol, Total: 103 mg/dL (ref 100–199)
HDL: 41 mg/dL (ref >39)
LDL Chol Calc (NIH): 31 mg/dL (ref 0–99)
Triglycerides: 198 mg/dL — ABNORMAL HIGH (ref 0–149)
VLDL Cholesterol Cal: 31 mg/dL (ref 5–40)

## 2023-01-07 LAB — VITAMIN D 25 HYDROXY (VIT D DEFICIENCY, FRACTURES): Vit D, 25-Hydroxy: 67.8 ng/mL (ref 30.0–100.0)

## 2023-01-07 LAB — CBC WITH DIFFERENTIAL/PLATELET
Basophils Absolute: 0.1 10*3/uL (ref 0.0–0.2)
Basos: 1 %
EOS (ABSOLUTE): 0.5 10*3/uL — ABNORMAL HIGH (ref 0.0–0.4)
Eos: 5 %
Hematocrit: 43 % (ref 34.0–46.6)
Hemoglobin: 13.8 g/dL (ref 11.1–15.9)
Immature Grans (Abs): 0 10*3/uL (ref 0.0–0.1)
Immature Granulocytes: 0 %
Lymphocytes Absolute: 6.2 10*3/uL — ABNORMAL HIGH (ref 0.7–3.1)
Lymphs: 53 %
MCH: 32.2 pg (ref 26.6–33.0)
MCHC: 32.1 g/dL (ref 31.5–35.7)
MCV: 100 fL — ABNORMAL HIGH (ref 79–97)
Monocytes Absolute: 1 10*3/uL — ABNORMAL HIGH (ref 0.1–0.9)
Monocytes: 8 %
Neutrophils Absolute: 3.8 10*3/uL (ref 1.4–7.0)
Neutrophils: 33 %
Platelets: 306 10*3/uL (ref 150–450)
RBC: 4.29 x10E6/uL (ref 3.77–5.28)
RDW: 11.9 % (ref 11.7–15.4)
WBC: 11.7 10*3/uL — ABNORMAL HIGH (ref 3.4–10.8)

## 2023-01-07 LAB — COMPREHENSIVE METABOLIC PANEL
ALT: 11 IU/L (ref 0–32)
AST: 19 IU/L (ref 0–40)
Albumin: 4.6 g/dL (ref 3.9–4.9)
Alkaline Phosphatase: 55 IU/L (ref 44–121)
BUN/Creatinine Ratio: 14 (ref 12–28)
BUN: 19 mg/dL (ref 8–27)
Bilirubin Total: <0.2 mg/dL (ref 0.0–1.2)
CO2: 21 mmol/L (ref 20–29)
Calcium: 10.2 mg/dL (ref 8.7–10.3)
Chloride: 104 mmol/L (ref 96–106)
Creatinine, Ser: 1.32 mg/dL — ABNORMAL HIGH (ref 0.57–1.00)
Globulin, Total: 2.4 g/dL (ref 1.5–4.5)
Glucose: 129 mg/dL — ABNORMAL HIGH (ref 70–99)
Potassium: 5.9 mmol/L — ABNORMAL HIGH (ref 3.5–5.2)
Sodium: 140 mmol/L (ref 134–144)
Total Protein: 7 g/dL (ref 6.0–8.5)
eGFR: 43 mL/min/{1.73_m2} — ABNORMAL LOW (ref >59)

## 2023-01-13 ENCOUNTER — Encounter: Payer: Self-pay | Admitting: Family Medicine

## 2023-01-13 ENCOUNTER — Ambulatory Visit (INDEPENDENT_AMBULATORY_CARE_PROVIDER_SITE_OTHER): Payer: Medicare HMO | Admitting: Family Medicine

## 2023-01-13 VITALS — BP 122/66 | HR 84 | Resp 18 | Ht 61.5 in | Wt 129.0 lb

## 2023-01-13 DIAGNOSIS — N1832 Chronic kidney disease, stage 3b: Secondary | ICD-10-CM

## 2023-01-13 DIAGNOSIS — E1100 Type 2 diabetes mellitus with hyperosmolarity without nonketotic hyperglycemic-hyperosmolar coma (NKHHC): Secondary | ICD-10-CM

## 2023-01-13 DIAGNOSIS — E782 Mixed hyperlipidemia: Secondary | ICD-10-CM | POA: Diagnosis not present

## 2023-01-13 DIAGNOSIS — Z7984 Long term (current) use of oral hypoglycemic drugs: Secondary | ICD-10-CM

## 2023-01-13 DIAGNOSIS — N183 Chronic kidney disease, stage 3 unspecified: Secondary | ICD-10-CM | POA: Insufficient documentation

## 2023-01-13 DIAGNOSIS — Z1211 Encounter for screening for malignant neoplasm of colon: Secondary | ICD-10-CM

## 2023-01-13 DIAGNOSIS — E1169 Type 2 diabetes mellitus with other specified complication: Secondary | ICD-10-CM | POA: Diagnosis not present

## 2023-01-13 LAB — POCT GLYCOSYLATED HEMOGLOBIN (HGB A1C): HbA1c POC (<> result, manual entry): 7.2 % (ref 4.0–5.6)

## 2023-01-13 MED ORDER — ROSUVASTATIN CALCIUM 20 MG PO TABS
20.0000 mg | ORAL_TABLET | Freq: Every day | ORAL | 1 refills | Status: DC
Start: 2023-01-13 — End: 2023-04-24

## 2023-01-13 MED ORDER — METFORMIN HCL 1000 MG PO TABS
1000.0000 mg | ORAL_TABLET | Freq: Two times a day (BID) | ORAL | 1 refills | Status: DC
Start: 2023-01-13 — End: 2023-07-11

## 2023-01-13 MED ORDER — EZETIMIBE 10 MG PO TABS
10.0000 mg | ORAL_TABLET | Freq: Every day | ORAL | 0 refills | Status: DC
Start: 2023-01-13 — End: 2023-04-24

## 2023-01-13 MED ORDER — LOSARTAN POTASSIUM 25 MG PO TABS: 25.0000 mg | ORAL_TABLET | Freq: Every day | ORAL | 0 refills | Status: DC

## 2023-01-13 NOTE — Progress Notes (Signed)
Established Patient Office Visit  Subjective   Patient ID: Shahed Greenhut, female    DOB: December 24, 1950  Age: 72 y.o. MRN: [id number]  Chief Complaint  Patient presents with   Hyperlipidemia    HPI Dealva Matlick is a 72 y.o. female presenting today for follow up of diabetes, hyperlipidemia. Diabetes: denies hypoglycemic events, wounds or sores that are not healing well, increased thirst or urination. Denies vision problems, eye exam due which patient is aware of. Taking metformin though not as prescribed, she has typically only been taking her evening dose.  She is not usually hungry for breakfast in the morning, but taking metformin without food causes her side effects so she has been skipping the morning dose.  She would not be surprised if her A1c increased. Hyperlipidemia: tolerating rosuvastatin and Zetia well with no myalgias or significant side effects.  The ASCVD Risk score (Arnett DK, et al., 2019) failed to calculate for the following reasons:   The valid total cholesterol range is 130 to 320 mg/dL  ROS Negative unless otherwise noted in HPI   Objective:     BP 122/66 (BP Location: Left Arm, Patient Position: Sitting, Cuff Size: Normal)   Pulse 84   Resp 18   Ht 5' 1.5" (1.562 m)   Wt 129 lb (58.5 kg)   SpO2 95%   BMI 23.98 kg/m   Physical Exam Constitutional:      General: She is not in acute distress.    Appearance: Normal appearance.  HENT:     Head: Normocephalic and atraumatic.  Cardiovascular:     Rate and Rhythm: Normal rate and regular rhythm.     Pulses: Normal pulses.     Heart sounds: Murmur heard.     No friction rub. No gallop.  Pulmonary:     Effort: Pulmonary effort is normal. No respiratory distress.     Breath sounds: No wheezing, rhonchi or rales.  Skin:    General: Skin is warm and dry.  Neurological:     Mental Status: She is alert and oriented to person, place, and time.    Diabetic Foot Exam - Simple   Simple Foot  Form Diabetic Foot exam was performed with the following findings: Yes 01/13/2023  8:42 AM  Visual Inspection No deformities, no ulcerations, no other skin breakdown bilaterally: Yes Sensation Testing Intact to touch and monofilament testing bilaterally: Yes Pulse Check Posterior Tibialis and Dorsalis pulse intact bilaterally: Yes Comments     Assessment & Plan:  Type 2 diabetes mellitus with hyperosmolarity without coma, without long-term current use of insulin (HCC) Assessment & Plan: A1c today 7.2, which is an increase from 6.6.  Hyperglycemia is likely the cause for the increase in potassium and decrease in EGFR.  Patient plans on starting to have a boost shake in the morning so that she can take her morning dose of metformin consistently.  Continue metformin 1000 mg twice daily.  Foot exam conducted today, within normal limits.  Aware that she is due for her diabetic eye exam and is planning on scheduling that soon.  Orders: -     POCT glycosylated hemoglobin (Hb A1C) -     Losartan Potassium; Take 1 tablet (25 mg total) by mouth daily.  Dispense: 90 tablet; Refill: 0 -     metFORMIN HCl; Take 1 tablet (1,000 mg total) by mouth 2 (two) times daily with a meal.  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  Continue rosuvastatin 20 mg daily and Zetia 10 mg daily.  Will continue to monitor.  Orders: -     Rosuvastatin Calcium; Take 1 tablet (20 mg total) by mouth daily.  Dispense: 90 tablet; Refill: 1 -     Ezetimibe; Take 1 tablet (10 mg total) by mouth daily after supper.  Dispense: 90 tablet; Refill: 0  Stage 3b chronic kidney disease (HCC) Assessment & Plan: EGFR decreased from 59 to 43.  This is most likely secondary to hyperglycemia due to missing morning dose of metformin.  Will continue to  monitor, recheck CMP with lab work in 4 months.   Screening for colorectal cancer -     Cologuard  Patient is agreeable to having Cologuard, she would like to avoid invasive procedures as she is concerned about the potential risks now that she is older.  Return in about 4 months (around 05/16/2023) for follow-up for HLD, DM, fasting blood work 1 week before.    [name], PA

## 2023-01-13 NOTE — Patient Instructions (Addendum)
I sent in refills of everything that looks like he would run out before I see you again.  [facility]  The Cologuard test will be mailed directly to your house.  If taking your medicine with the boost shakes still does not help with the side effects, we can also consider changing it to the extended release version of metformin.  Often times, people have fewer side effects with the extended release.

## 2023-01-13 NOTE — Assessment & Plan Note (Signed)
A1c today 7.2, which is an increase from 6.6.  Hyperglycemia is likely the cause for the increase in potassium and decrease in EGFR.  Patient plans on starting to have a boost shake in the morning so that she can take her morning dose of metformin consistently.  Continue metformin 1000 mg twice daily.  Foot exam conducted today, within normal limits.  Aware that she is due for her diabetic eye exam and is planning on scheduling that soon.

## 2023-01-13 NOTE — Assessment & Plan Note (Signed)
Last lipid panel: LDL 31, HDL 41, triglycerides 98.  Continue rosuvastatin 20 mg daily and Zetia 10 mg daily.  Will continue to monitor.

## 2023-01-13 NOTE — Assessment & Plan Note (Signed)
EGFR decreased from 59 to 43.  This is most likely secondary to hyperglycemia due to missing morning dose of metformin.  Will continue to monitor, recheck CMP with lab work in 4 months.

## 2023-01-17 ENCOUNTER — Ambulatory Visit (INDEPENDENT_AMBULATORY_CARE_PROVIDER_SITE_OTHER): Payer: Medicare HMO

## 2023-01-17 VITALS — Ht 61.5 in | Wt 128.0 lb

## 2023-01-17 DIAGNOSIS — Z0000 Encounter for general adult medical examination without abnormal findings: Secondary | ICD-10-CM

## 2023-01-17 NOTE — Patient Instructions (Addendum)
[name] , Thank you for taking time to come for your Medicare Wellness Visit. I appreciate your ongoing commitment to your health goals. Please review the following plan we discussed and let me know if I can assist you in the future.   These are the goals we discussed:  Goals       Stay Healthy (pt-stated)        This is a list of the screening recommended for you and due dates:  Health Maintenance  Topic Date Due   Zoster (Shingles) Vaccine (1 of 2) Never done   Colon Cancer Screening  05/30/2017   Pneumonia Vaccine (2 of 2 - PPSV23 or PCV20) 11/15/2017   Eye exam for diabetics  01/16/2021   COVID-19 Vaccine (3 - 2023-24 season) 03/11/2022   Flu Shot  02/09/2023   Mammogram  03/05/2023   Hemoglobin A1C  07/16/2023   Yearly kidney health urinalysis for diabetes  09/13/2023   Yearly kidney function blood test for diabetes  01/06/2024   Complete foot exam   01/13/2024   Medicare Annual Wellness Visit  01/17/2024   DTaP/Tdap/Td vaccine (2 - Td or Tdap) 09/21/2027   DEXA scan (bone density measurement)  Completed   Hepatitis C Screening  Completed   HPV Vaccine  Aged Out    Advanced directives: Advance directive discussed with you today. Even though you declined this today, please call our office should you change your mind, and we can give you the proper paperwork for you to fill out.   Conditions/risks identified: None  Next appointment: Follow up in one year for your annual wellness visit    Preventive Care 65 Years and Older, Female Preventive care refers to lifestyle choices and visits with your health care provider that can promote health and wellness. What does preventive care include? A yearly physical exam. This is also called an annual well check. Dental exams once or twice a year. Routine eye exams. Ask your health care provider how often you should have your eyes checked. Personal lifestyle choices, including: Daily care of your teeth and gums. Regular  physical activity. Eating a healthy diet. Avoiding tobacco and drug use. Limiting alcohol use. Practicing safe sex. Taking low-dose aspirin every day. Taking vitamin and mineral supplements as recommended by your health care provider. What happens during an annual well check? The services and screenings done by your health care provider during your annual well check will depend on your age, overall health, lifestyle risk factors, and family history of disease. Counseling  Your health care provider may ask you questions about your: Alcohol use. Tobacco use. Drug use. Emotional well-being. Home and relationship well-being. Sexual activity. Eating habits. History of falls. Memory and ability to understand (cognition). Work and work Astronomer. Reproductive health. Screening  You may have the following tests or measurements: Height, weight, and BMI. Blood pressure. Lipid and cholesterol levels. These may be checked every 5 years, or more frequently if you are over 42 years old. Skin check. Lung cancer screening. You may have this screening every year starting at age 70 if you have a 30-pack-year history of smoking and currently smoke or have quit within the past 15 years. Fecal occult blood test (FOBT) of the stool. You may have this test every year starting at age 63. Flexible sigmoidoscopy or colonoscopy. You may have a sigmoidoscopy every 5 years or a colonoscopy every 10 years starting at age 45. Hepatitis C blood test. Hepatitis B blood test. Sexually transmitted disease (STD)  testing. Diabetes screening. This is done by checking your blood sugar (glucose) after you have not eaten for a while (fasting). You may have this done every 1-3 years. Bone density scan. This is done to screen for osteoporosis. You may have this done starting at age 60. Mammogram. This may be done every 1-2 years. Talk to your health care provider about how often you should have regular mammograms. Talk  with your health care provider about your test results, treatment options, and if necessary, the need for more tests. Vaccines  Your health care provider may recommend certain vaccines, such as: Influenza vaccine. This is recommended every year. Tetanus, diphtheria, and acellular pertussis (Tdap, Td) vaccine. You may need a Td booster every 10 years. Zoster vaccine. You may need this after age 25. Pneumococcal 13-valent conjugate (PCV13) vaccine. One dose is recommended after age 58. Pneumococcal polysaccharide (PPSV23) vaccine. One dose is recommended after age 18. Talk to your health care provider about which screenings and vaccines you need and how often you need them. This information is not intended to replace advice given to you by your health care provider. Make sure you discuss any questions you have with your health care provider. Document Released: 07/24/2015 Document Revised: 03/16/2016 Document Reviewed: 04/28/2015 Elsevier Interactive Patient Education  2017 [employer].  Fall Prevention in the Home Falls can cause injuries. They can happen to people of all ages. There are many things you can do to make your home safe and to help prevent falls. What can I do on the outside of my home? Regularly fix the edges of walkways and driveways and fix any cracks. Remove anything that might make you trip as you walk through a door, such as a raised step or threshold. Trim any bushes or trees on the path to your home. Use bright outdoor lighting. Clear any walking paths of anything that might make someone trip, such as rocks or tools. Regularly check to see if handrails are loose or broken. Make sure that both sides of any steps have handrails. Any raised decks and porches should have guardrails on the edges. Have any leaves, snow, or ice cleared regularly. Use sand or salt on walking paths during winter. Clean up any spills in your garage right away. This includes oil or grease  spills. What can I do in the bathroom? Use night lights. Install grab bars by the toilet and in the tub and shower. Do not use towel bars as grab bars. Use non-skid mats or decals in the tub or shower. If you need to sit down in the shower, use a plastic, non-slip stool. Keep the floor dry. Clean up any water that spills on the floor as soon as it happens. Remove soap buildup in the tub or shower regularly. Attach bath mats securely with double-sided non-slip rug tape. Do not have throw rugs and other things on the floor that can make you trip. What can I do in the bedroom? Use night lights. Make sure that you have a light by your bed that is easy to reach. Do not use any sheets or blankets that are too big for your bed. They should not hang down onto the floor. Have a firm chair that has side arms. You can use this for support while you get dressed. Do not have throw rugs and other things on the floor that can make you trip. What can I do in the kitchen? Clean up any spills right away. Avoid walking on wet  floors. Keep items that you use a lot in easy-to-reach places. If you need to reach something above you, use a strong step stool that has a grab bar. Keep electrical cords out of the way. Do not use floor polish or wax that makes floors slippery. If you must use wax, use non-skid floor wax. Do not have throw rugs and other things on the floor that can make you trip. What can I do with my stairs? Do not leave any items on the stairs. Make sure that there are handrails on both sides of the stairs and use them. Fix handrails that are broken or loose. Make sure that handrails are as long as the stairways. Check any carpeting to make sure that it is firmly attached to the stairs. Fix any carpet that is loose or worn. Avoid having throw rugs at the top or bottom of the stairs. If you do have throw rugs, attach them to the floor with carpet tape. Make sure that you have a light switch at the  top of the stairs and the bottom of the stairs. If you do not have them, ask someone to add them for you. What else can I do to help prevent falls? Wear shoes that: Do not have high heels. Have rubber bottoms. Are comfortable and fit you well. Are closed at the toe. Do not wear sandals. If you use a stepladder: Make sure that it is fully opened. Do not climb a closed stepladder. Make sure that both sides of the stepladder are locked into place. Ask someone to hold it for you, if possible. Clearly mark and make sure that you can see: Any grab bars or handrails. First and last steps. Where the edge of each step is. Use tools that help you move around (mobility aids) if they are needed. These include: Canes. Walkers. Scooters. Crutches. Turn on the lights when you go into a dark area. Replace any light bulbs as soon as they burn out. Set up your furniture so you have a clear path. Avoid moving your furniture around. If any of your floors are uneven, fix them. If there are any pets around you, be aware of where they are. Review your medicines with your doctor. Some medicines can make you feel dizzy. This can increase your chance of falling. Ask your doctor what other things that you can do to help prevent falls. This information is not intended to replace advice given to you by your health care provider. Make sure you discuss any questions you have with your health care provider. Document Released: 04/23/2009 Document Revised: 12/03/2015 Document Reviewed: 08/01/2014 Elsevier Interactive Patient Education  2017 [employer].

## 2023-01-17 NOTE — Progress Notes (Signed)
Subjective:   Autumn Walker is a 72 y.o. female who presents for Medicare Annual (Subsequent) preventive examination.  Visit Complete: Virtual  I connected with  [name] on 01/17/23 by a audio enabled telemedicine application and verified that I am speaking with the correct person using two identifiers.  Patient Location: Home  Provider Location: Home Office  I discussed the limitations of evaluation and management by telemedicine. The patient expressed understanding and agreed to proceed.  Patient Medicare AWV questionnaire was completed by the patient on 7/79/24; I have confirmed that all information answered by patient is correct and no changes since this date.  Review of Systems     Cardiac Risk Factors include: advanced age (>5men, >68 women);diabetes mellitus     Objective:    Today's Vitals   01/17/23 0833 01/17/23 0836  Weight: 128 lb (58.1 kg)   Height: 5' 1.5" (1.562 m)   PainSc:  0-No pain   Body mass index is 23.79 kg/m.     01/17/2023    8:46 AM 03/13/2019   12:52 PM 02/15/2017    8:22 AM 05/30/2016    8:07 AM 05/16/2016    2:21 PM 03/21/2016    9:24 AM  Advanced Directives  Does Patient Have a Medical Advance Directive? No No No No No No  Would patient like information on creating a medical advance directive? No - Patient declined No - Patient declined No - Patient declined No - patient declined information  No - patient declined information    Current Medications (verified) Outpatient Encounter Medications as of 01/17/2023  Medication Sig   albuterol (VENTOLIN HFA) 108 (90 Base) MCG/ACT inhaler INHALE 1-2 PUFFS INTO THE LUNGS EVERY 4 (FOUR) HOURS AS NEEDED FOR WHEEZING OR SHORTNESS OF BREATH.   ezetimibe (ZETIA) 10 MG tablet Take 1 tablet (10 mg total) by mouth daily after supper.   fluticasone (FLONASE) 50 MCG/ACT nasal spray USE 1 SPRAY IN EACH NOSTRIL TWICE DAILY FOLLOWING SINUS RINSES   gabapentin (NEURONTIN) 300 MG capsule  TAKE 1 CAPSULE IN AM, 1 CAPSULE IN AFTERNOON AND 2 CAPSULES AT BEDTIME   ibuprofen (ADVIL,MOTRIN) 200 MG tablet Take 600 mg by mouth every 6 (six) hours as needed.   losartan (COZAAR) 25 MG tablet Take 1 tablet (25 mg total) by mouth daily.   metFORMIN (GLUCOPHAGE) 1000 MG tablet Take 1 tablet (1,000 mg total) by mouth 2 (two) times daily with a meal.   Methylcobalamin (B12-ACTIVE PO) Take by mouth.   montelukast (SINGULAIR) 10 MG tablet TAKE 1 TABLET BY MOUTH EVERYDAY AT BEDTIME   omega-3 acid ethyl esters (LOVAZA) 1 g capsule Take 2 capsules (2 g total) by mouth 2 (two) times daily.   rosuvastatin (CRESTOR) 20 MG tablet Take 1 tablet (20 mg total) by mouth daily.   VITAMIN D PO Take by mouth. 50000 iu weekly   No facility-administered encounter medications on file as of 01/17/2023.    Allergies (verified) Atorvastatin and Codeine   History: Past Medical History:  Diagnosis Date   Allergy    Blood transfusion without reported diagnosis    Cataract    Diabetes mellitus without complication (HCC)    Past Surgical History:  Procedure Laterality Date   APPENDECTOMY     CHOLECYSTECTOMY     PANCREATECTOMY     SPLENECTOMY, TOTAL     Family History  Problem Relation Age of Onset   Thyroid disease Mother    Heart disease Father    Alcohol abuse Father  Heart attack Father    Hyperlipidemia Father    Depression Sister    Thyroid disease Sister    Thyroid disease Daughter    Diabetes Maternal Grandmother    Heart disease Maternal Grandfather    Cancer Paternal Grandfather        prostate   Hypertension Paternal Grandfather    Alcohol abuse Brother    Colon cancer Neg Hx    Breast cancer Neg Hx    Social History   Socioeconomic History   Marital status: Married    Spouse name: Not on file   Number of children: Not on file   Years of education: Not on file   Highest education level: Not on file  Occupational History   Not on file  Tobacco Use   Smoking status: Some  Days    Packs/day: 0.00    Years: 40.00    Additional pack years: 0.00    Total pack years: 0.00    Types: Cigarettes    Passive exposure: Past   Smokeless tobacco: Never   Tobacco comments:    2-3 cigs weekly   Vaping Use   Vaping Use: Never used  Substance and Sexual Activity   Alcohol use: No   Drug use: No   Sexual activity: Yes  Other Topics Concern   Not on file  Social History Narrative   Not on file   Social Determinants of Health   Financial Resource Strain: Low Risk  (01/17/2023)   Overall Financial Resource Strain (CARDIA)    Difficulty of Paying Living Expenses: Not hard at all  Food Insecurity: No Food Insecurity (01/17/2023)   Hunger Vital Sign    Worried About Running Out of Food in the Last Year: Never true    Ran Out of Food in the Last Year: Never true  Transportation Needs: No Transportation Needs (01/17/2023)   PRAPARE - Administrator, Civil Service (Medical): No    Lack of Transportation (Non-Medical): No  Physical Activity: Insufficiently Active (01/17/2023)   Exercise Vital Sign    Days of Exercise per Week: 1 day    Minutes of Exercise per Session: 30 min  Stress: No Stress Concern Present (01/17/2023)   [employer] of Occupational Health - Occupational Stress Questionnaire    Feeling of Stress : Not at all  Social Connections: Moderately Isolated (01/17/2023)   Social Connection and Isolation Panel [NHANES]    Frequency of Communication with Friends and Family: More than three times a week    Frequency of Social Gatherings with Friends and Family: More than three times a week    Attends Religious Services: Never    Database administrator or Organizations: No    Attends Engineer, structural: Never    Marital Status: Married    Tobacco Counseling Ready to quit: No Counseling given: Yes Tobacco comments: 2-3 cigs weekly    Clinical Intake:  Pre-visit preparation completed: Yes  Pain : No/denies pain Pain Score: 0-No  pain     BMI - recorded: 23.79 Nutritional Risks: None Diabetes: Yes CBG done?: Yes CBG resulted in Enter/ Edit results?: Yes (CBG 139 Taken by patient) Did pt. bring in CBG monitor from home?: No  How often do you need to have someone help you when you read instructions, pamphlets, or other written materials from your doctor or pharmacy?: 1 - Never  Interpreter Needed?: No  Information entered by :: Theresa Mulligan LPN   Activities of Daily Living  01/17/2023    8:44 AM 01/17/2023    8:10 AM  In your present state of health, do you have any difficulty performing the following activities:  Hearing? 0 0  Vision? 0 0  Difficulty concentrating or making decisions? 0 0  Walking or climbing stairs? 0 0  Dressing or bathing? 0 0  Doing errands, shopping? 0 0  Preparing Food and eating ? N N  Using the Toilet? N N  In the past six months, have you accidently leaked urine? Y Y  Comment Wears panty liners on occasions   Do you have problems with loss of bowel control? N N  Managing your Medications? N N  Managing your Finances? N N  Housekeeping or managing your Housekeeping? N N    Patient Care Team: [name], PA as PCP - General (Family Medicine) Romero Belling, MD (Inactive) as Consulting Physician (Endocrinology)  Indicate any recent Medical Services you may have received from other than [facility] providers in the past year (date may be approximate).     Assessment:   This is a routine wellness examination for Merrick.  Hearing/Vision screen Hearing Screening - Comments:: Denies hearing difficulties   Vision Screening - Comments:: Wears rx glasses - up to date with routine eye exams with  Deferred  Dietary issues and exercise activities discussed:     Goals Addressed               This Visit's Progress     Stay Healthy (pt-stated)         Depression Screen    01/17/2023    8:43 AM 09/13/2022    8:58 AM 05/10/2022    9:12 AM 09/28/2021    9:03 AM  05/11/2021    8:46 AM 02/08/2021    9:05 AM 10/06/2020    3:55 PM  PHQ 2/9 Scores  PHQ - 2 Score 0 0 0 0 0 0 0  PHQ- 9 Score   1 0 1 2 1     Fall Risk    01/17/2023    8:45 AM 01/17/2023    8:10 AM 09/13/2022    9:29 AM 09/28/2021    9:02 AM 05/11/2021    8:46 AM  Fall Risk   Falls in the past year? 0 0 1 0 0  Number falls in past yr: 0 0 0 0 0  Injury with Fall? 0  0 0 0  Risk for fall due to : No Fall Risks   No Fall Risks No Fall Risks  Follow up Falls prevention discussed   Falls evaluation completed Falls evaluation completed    MEDICARE RISK AT HOME:  Medicare Risk at Home - 01/17/23 0853     Any stairs in or around the home? Yes    If so, are there any without handrails? No    Home free of loose throw rugs in walkways, pet beds, electrical cords, etc? Yes    Adequate lighting in your home to reduce risk of falls? Yes    Life alert? No    Use of a cane, walker or w/c? No    Grab bars in the bathroom? Yes    Shower chair or bench in shower? Yes    Elevated toilet seat or a handicapped toilet? Yes             TIMED UP AND GO:  Was the test performed?  No    Cognitive Function:        01/17/2023  8:46 AM 02/08/2021    8:48 AM  6CIT Screen  What Year? 0 points 0 points  What month? 0 points 0 points  What time? 0 points 0 points  Count back from 20 0 points 0 points  Months in reverse 0 points 0 points  Repeat phrase 0 points 0 points  Total Score 0 points 0 points    Immunizations Immunization History  Administered Date(s) Administered   Fluad Quad(high Dose 65+) 09/13/2022   Influenza Whole 04/09/2008   Influenza, High Dose Seasonal PF 05/17/2017, 08/21/2018, 05/11/2019   Influenza,inj,Quad PF,6+ Mos 04/18/2016   Moderna Sars-Covid-2 Vaccination 09/09/2019, 10/10/2019   Pneumococcal Conjugate-13 09/20/2017   Tdap 09/20/2017    TDAP status: Up to date  Flu Vaccine status: Up to date  Pneumococcal vaccine status: Due, Education has been provided  regarding the importance of this vaccine. Advised may receive this vaccine at local pharmacy or Health Dept. Aware to provide a copy of the vaccination record if obtained from local pharmacy or Health Dept. Verbalized acceptance and understanding.  Covid-19 vaccine status: Completed vaccines  Qualifies for Shingles Vaccine? Yes   Zostavax completed No   Shingrix Completed?: No.    Education has been provided regarding the importance of this vaccine. Patient has been advised to call insurance company to determine out of pocket expense if they have not yet received this vaccine. Advised may also receive vaccine at local pharmacy or Health Dept. Verbalized acceptance and understanding.  Screening Tests Health Maintenance  Topic Date Due   Zoster Vaccines- Shingrix (1 of 2) Never done   Colonoscopy  05/30/2017   Pneumonia Vaccine 3+ Years old (2 of 2 - PPSV23 or PCV20) 11/15/2017   OPHTHALMOLOGY EXAM  01/16/2021   COVID-19 Vaccine (3 - 2023-24 season) 03/11/2022   INFLUENZA VACCINE  02/09/2023   MAMMOGRAM  03/05/2023   HEMOGLOBIN A1C  07/16/2023   Diabetic kidney evaluation - Urine ACR  09/13/2023   Diabetic kidney evaluation - eGFR measurement  01/06/2024   FOOT EXAM  01/13/2024   Medicare Annual Wellness (AWV)  01/17/2024   DTaP/Tdap/Td (2 - Td or Tdap) 09/21/2027   DEXA SCAN  Completed   Hepatitis C Screening  Completed   HPV VACCINES  Aged Out    Health Maintenance  Health Maintenance Due  Topic Date Due   Zoster Vaccines- Shingrix (1 of 2) Never done   Colonoscopy  05/30/2017   Pneumonia Vaccine 55+ Years old (2 of 2 - PPSV23 or PCV20) 11/15/2017   OPHTHALMOLOGY EXAM  01/16/2021   COVID-19 Vaccine (3 - 2023-24 season) 03/11/2022    Colorectal cancer screening: Referral to GI placed Deferred. Pt aware the office will call re: appt.  Mammogram status: Completed 03/04/21. Repeat every year  Bone Density status: Completed 03/04/21. Results reflect: Bone density results:  OSTEOPOROSIS. Repeat every   years.  Lung Cancer Screening: (Low Dose CT Chest recommended if Age 39-80 years, 20 pack-year currently smoking OR have quit w/in 15years.) does qualify.   Lung Cancer Screening Referral: Deferred  Additional Screening:  Hepatitis C Screening: does qualify; Completed 03/30/16  Vision Screening: Recommended annual ophthalmology exams for early detection of glaucoma and other disorders of the eye. Is the patient up to date with their annual eye exam?  Yes  Who is the provider or what is the name of the office in which the patient attends annual eye exams? Deferred If pt is not established with a provider, would they like to be referred to a  provider to establish care? No .   Dental Screening: Recommended annual dental exams for proper oral hygiene  Diabetic Foot Exam: Diabetic Foot Exam: Completed 01/17/23  Community Resource Referral / Chronic Care Management:  CRR required this visit?  No   CCM required this visit?  No     Plan:     I have personally reviewed and noted the following in the patient's chart:   Medical and social history Use of alcohol, tobacco or illicit drugs  Current medications and supplements including opioid prescriptions. Patient is not currently taking opioid prescriptions. Functional ability and status Nutritional status Physical activity Advanced directives List of other physicians Hospitalizations, surgeries, and ER visits in previous 12 months Vitals Screenings to include cognitive, depression, and falls Referrals and appointments  In addition, I have reviewed and discussed with patient certain preventive protocols, quality metrics, and best practice recommendations. A written personalized care plan for preventive services as well as general preventive health recommendations were provided to patient.     Tillie Rung, LPN   4/0/9811   After Visit Summary: (MyChart) Due to this being a telephonic visit, the  after visit summary with patients personalized plan was offered to patient via MyChart   Nurse Notes: None

## 2023-01-31 LAB — COLOGUARD

## 2023-02-14 DIAGNOSIS — H524 Presbyopia: Secondary | ICD-10-CM | POA: Diagnosis not present

## 2023-02-14 DIAGNOSIS — H5213 Myopia, bilateral: Secondary | ICD-10-CM | POA: Diagnosis not present

## 2023-02-14 DIAGNOSIS — H40013 Open angle with borderline findings, low risk, bilateral: Secondary | ICD-10-CM | POA: Diagnosis not present

## 2023-02-14 DIAGNOSIS — H52223 Regular astigmatism, bilateral: Secondary | ICD-10-CM | POA: Diagnosis not present

## 2023-02-14 DIAGNOSIS — H2513 Age-related nuclear cataract, bilateral: Secondary | ICD-10-CM | POA: Diagnosis not present

## 2023-02-14 DIAGNOSIS — E119 Type 2 diabetes mellitus without complications: Secondary | ICD-10-CM | POA: Diagnosis not present

## 2023-02-14 LAB — HM DIABETES EYE EXAM

## 2023-02-15 ENCOUNTER — Encounter: Payer: Self-pay | Admitting: Family Medicine

## 2023-02-20 DIAGNOSIS — Z1212 Encounter for screening for malignant neoplasm of rectum: Secondary | ICD-10-CM | POA: Diagnosis not present

## 2023-02-20 DIAGNOSIS — Z1211 Encounter for screening for malignant neoplasm of colon: Secondary | ICD-10-CM | POA: Diagnosis not present

## 2023-04-14 ENCOUNTER — Other Ambulatory Visit: Payer: Self-pay | Admitting: Family Medicine

## 2023-04-14 DIAGNOSIS — E1100 Type 2 diabetes mellitus with hyperosmolarity without nonketotic hyperglycemic-hyperosmolar coma (NKHHC): Secondary | ICD-10-CM

## 2023-04-23 ENCOUNTER — Other Ambulatory Visit: Payer: Self-pay | Admitting: Family Medicine

## 2023-04-23 DIAGNOSIS — E1169 Type 2 diabetes mellitus with other specified complication: Secondary | ICD-10-CM

## 2023-04-24 ENCOUNTER — Inpatient Hospital Stay (HOSPITAL_COMMUNITY)
Admission: EM | Admit: 2023-04-24 | Discharge: 2023-04-26 | DRG: 066 | Disposition: A | Discharge status: Home / Self Care | Payer: Medicare HMO | Attending: Internal Medicine | Admitting: Internal Medicine

## 2023-04-24 ENCOUNTER — Other Ambulatory Visit: Payer: Self-pay

## 2023-04-24 ENCOUNTER — Encounter (HOSPITAL_COMMUNITY): Payer: Self-pay | Admitting: Emergency Medicine

## 2023-04-24 ENCOUNTER — Emergency Department (HOSPITAL_COMMUNITY): Payer: Medicare HMO

## 2023-04-24 DIAGNOSIS — I129 Hypertensive chronic kidney disease with stage 1 through stage 4 chronic kidney disease, or unspecified chronic kidney disease: Secondary | ICD-10-CM | POA: Diagnosis not present

## 2023-04-24 DIAGNOSIS — Z716 Tobacco abuse counseling: Secondary | ICD-10-CM

## 2023-04-24 DIAGNOSIS — R4701 Aphasia: Secondary | ICD-10-CM | POA: Diagnosis present

## 2023-04-24 DIAGNOSIS — E785 Hyperlipidemia, unspecified: Secondary | ICD-10-CM | POA: Diagnosis not present

## 2023-04-24 DIAGNOSIS — Z7722 Contact with and (suspected) exposure to environmental tobacco smoke (acute) (chronic): Secondary | ICD-10-CM | POA: Diagnosis present

## 2023-04-24 DIAGNOSIS — F17210 Nicotine dependence, cigarettes, uncomplicated: Secondary | ICD-10-CM | POA: Diagnosis present

## 2023-04-24 DIAGNOSIS — I63512 Cerebral infarction due to unspecified occlusion or stenosis of left middle cerebral artery: Secondary | ICD-10-CM | POA: Diagnosis not present

## 2023-04-24 DIAGNOSIS — R29701 NIHSS score 1: Secondary | ICD-10-CM | POA: Diagnosis present

## 2023-04-24 DIAGNOSIS — R000 Tachycardia, unspecified: Secondary | ICD-10-CM | POA: Diagnosis not present

## 2023-04-24 DIAGNOSIS — N1831 Chronic kidney disease, stage 3a: Secondary | ICD-10-CM | POA: Diagnosis not present

## 2023-04-24 DIAGNOSIS — D72829 Elevated white blood cell count, unspecified: Secondary | ICD-10-CM | POA: Diagnosis not present

## 2023-04-24 DIAGNOSIS — Z79899 Other long term (current) drug therapy: Secondary | ICD-10-CM

## 2023-04-24 DIAGNOSIS — Z8249 Family history of ischemic heart disease and other diseases of the circulatory system: Secondary | ICD-10-CM | POA: Diagnosis not present

## 2023-04-24 DIAGNOSIS — R471 Dysarthria and anarthria: Secondary | ICD-10-CM | POA: Diagnosis present

## 2023-04-24 DIAGNOSIS — Z9049 Acquired absence of other specified parts of digestive tract: Secondary | ICD-10-CM | POA: Diagnosis not present

## 2023-04-24 DIAGNOSIS — J449 Chronic obstructive pulmonary disease, unspecified: Secondary | ICD-10-CM | POA: Diagnosis present

## 2023-04-24 DIAGNOSIS — R930 Abnormal findings on diagnostic imaging of skull and head, not elsewhere classified: Secondary | ICD-10-CM | POA: Diagnosis not present

## 2023-04-24 DIAGNOSIS — I639 Cerebral infarction, unspecified: Secondary | ICD-10-CM | POA: Diagnosis not present

## 2023-04-24 DIAGNOSIS — Z833 Family history of diabetes mellitus: Secondary | ICD-10-CM | POA: Diagnosis not present

## 2023-04-24 DIAGNOSIS — Z885 Allergy status to narcotic agent status: Secondary | ICD-10-CM

## 2023-04-24 DIAGNOSIS — I6782 Cerebral ischemia: Secondary | ICD-10-CM | POA: Diagnosis not present

## 2023-04-24 DIAGNOSIS — Z83438 Family history of other disorder of lipoprotein metabolism and other lipidemia: Secondary | ICD-10-CM | POA: Diagnosis not present

## 2023-04-24 DIAGNOSIS — I700 Atherosclerosis of aorta: Secondary | ICD-10-CM | POA: Diagnosis not present

## 2023-04-24 DIAGNOSIS — R918 Other nonspecific abnormal finding of lung field: Secondary | ICD-10-CM | POA: Diagnosis present

## 2023-04-24 DIAGNOSIS — Z23 Encounter for immunization: Secondary | ICD-10-CM

## 2023-04-24 DIAGNOSIS — I6521 Occlusion and stenosis of right carotid artery: Secondary | ICD-10-CM | POA: Diagnosis not present

## 2023-04-24 DIAGNOSIS — Z888 Allergy status to other drugs, medicaments and biological substances status: Secondary | ICD-10-CM | POA: Diagnosis not present

## 2023-04-24 DIAGNOSIS — E1122 Type 2 diabetes mellitus with diabetic chronic kidney disease: Secondary | ICD-10-CM | POA: Diagnosis not present

## 2023-04-24 DIAGNOSIS — Z7984 Long term (current) use of oral hypoglycemic drugs: Secondary | ICD-10-CM | POA: Diagnosis not present

## 2023-04-24 DIAGNOSIS — I359 Nonrheumatic aortic valve disorder, unspecified: Secondary | ICD-10-CM | POA: Diagnosis not present

## 2023-04-24 DIAGNOSIS — G4489 Other headache syndrome: Secondary | ICD-10-CM | POA: Diagnosis not present

## 2023-04-24 DIAGNOSIS — B0229 Other postherpetic nervous system involvement: Secondary | ICD-10-CM

## 2023-04-24 DIAGNOSIS — I350 Nonrheumatic aortic (valve) stenosis: Secondary | ICD-10-CM | POA: Diagnosis present

## 2023-04-24 LAB — COMPREHENSIVE METABOLIC PANEL
ALT: 14 U/L (ref 0–44)
AST: 21 U/L (ref 15–41)
Albumin: 4.1 g/dL (ref 3.5–5.0)
Alkaline Phosphatase: 37 U/L — ABNORMAL LOW (ref 38–126)
Anion gap: 12 (ref 5–15)
BUN: 12 mg/dL (ref 8–23)
CO2: 21 mmol/L — ABNORMAL LOW (ref 22–32)
Calcium: 9.4 mg/dL (ref 8.9–10.3)
Chloride: 102 mmol/L (ref 98–111)
Creatinine, Ser: 0.94 mg/dL (ref 0.44–1.00)
GFR, Estimated: >60 mL/min (ref >60)
Glucose, Bld: 103 mg/dL — ABNORMAL HIGH (ref 70–99)
Potassium: 3.9 mmol/L (ref 3.5–5.1)
Sodium: 135 mmol/L (ref 135–145)
Total Bilirubin: 0.6 mg/dL (ref 0.3–1.2)
Total Protein: 7.2 g/dL (ref 6.5–8.1)

## 2023-04-24 LAB — CBC
HCT: 40.2 % (ref 36.0–46.0)
Hemoglobin: 13 g/dL (ref 12.0–15.0)
MCH: 32.4 pg (ref 26.0–34.0)
MCHC: 32.3 g/dL (ref 30.0–36.0)
MCV: 100.2 fL — ABNORMAL HIGH (ref 80.0–100.0)
Platelets: 299 10*3/uL (ref 150–400)
RBC: 4.01 MIL/uL (ref 3.87–5.11)
RDW: 13.1 % (ref 11.5–15.5)
WBC: 13.7 10*3/uL — ABNORMAL HIGH (ref 4.0–10.5)
nRBC: 0 % (ref 0.0–0.2)

## 2023-04-24 LAB — DIFFERENTIAL
Abs Immature Granulocytes: 0.05 10*3/uL (ref 0.00–0.07)
Basophils Absolute: 0.1 10*3/uL (ref 0.0–0.1)
Basophils Relative: 1 %
Eosinophils Absolute: 0.1 10*3/uL (ref 0.0–0.5)
Eosinophils Relative: 1 %
Immature Granulocytes: 0 %
Lymphocytes Relative: 35 %
Lymphs Abs: 4.7 10*3/uL — ABNORMAL HIGH (ref 0.7–4.0)
Monocytes Absolute: 1.1 10*3/uL — ABNORMAL HIGH (ref 0.1–1.0)
Monocytes Relative: 8 %
Neutro Abs: 7.6 10*3/uL (ref 1.7–7.7)
Neutrophils Relative %: 55 %

## 2023-04-24 LAB — PROTIME-INR
INR: 0.9 (ref 0.8–1.2)
Prothrombin Time: 12.5 s (ref 11.4–15.2)

## 2023-04-24 LAB — I-STAT CHEM 8, ED
BUN: 13 mg/dL (ref 8–23)
Calcium, Ion: 1.14 mmol/L — ABNORMAL LOW (ref 1.15–1.40)
Chloride: 103 mmol/L (ref 98–111)
Creatinine, Ser: 0.9 mg/dL (ref 0.44–1.00)
Glucose, Bld: 102 mg/dL — ABNORMAL HIGH (ref 70–99)
HCT: 40 % (ref 36.0–46.0)
Hemoglobin: 13.6 g/dL (ref 12.0–15.0)
Potassium: 3.9 mmol/L (ref 3.5–5.1)
Sodium: 135 mmol/L (ref 135–145)
TCO2: 20 mmol/L — ABNORMAL LOW (ref 22–32)

## 2023-04-24 LAB — CBG MONITORING, ED
Glucose-Capillary: 102 mg/dL — ABNORMAL HIGH (ref 70–99)
Glucose-Capillary: 87 mg/dL (ref 70–99)

## 2023-04-24 LAB — APTT: aPTT: 28 s (ref 24–36)

## 2023-04-24 LAB — ETHANOL: Alcohol, Ethyl (B): <10 mg/dL (ref <10)

## 2023-04-24 MED ORDER — ACETAMINOPHEN 500 MG PO TABS
1000.0000 mg | ORAL_TABLET | Freq: Four times a day (QID) | ORAL | Status: DC | PRN
  Administered 2023-04-24: 1000 mg via ORAL
  Filled 2023-04-24: qty 2

## 2023-04-24 MED ORDER — ROSUVASTATIN CALCIUM 20 MG PO TABS
40.0000 mg | ORAL_TABLET | Freq: Every day | ORAL | Status: DC
  Administered 2023-04-25: 40 mg via ORAL
  Filled 2023-04-24 (×3): qty 2

## 2023-04-24 MED ORDER — ALBUTEROL SULFATE (2.5 MG/3ML) 0.083% IN NEBU: 3.0000 mL | INHALATION_SOLUTION | RESPIRATORY_TRACT | Status: DC | PRN

## 2023-04-24 MED ORDER — IOHEXOL 350 MG/ML SOLN
75.0000 mL | Freq: Once | INTRAVENOUS | Status: AC | PRN
  Administered 2023-04-24: 75 mL via INTRAVENOUS

## 2023-04-24 MED ORDER — MONTELUKAST SODIUM 10 MG PO TABS
10.0000 mg | ORAL_TABLET | Freq: Every day | ORAL | Status: DC | PRN
  Administered 2023-04-24: 10 mg via ORAL
  Filled 2023-04-24: qty 1

## 2023-04-24 MED ORDER — SODIUM CHLORIDE 0.9 % IV BOLUS
1000.0000 mL | Freq: Once | INTRAVENOUS | Status: AC
  Administered 2023-04-24: 1000 mL via INTRAVENOUS

## 2023-04-24 MED ORDER — GABAPENTIN 300 MG PO CAPS
600.0000 mg | ORAL_CAPSULE | Freq: Every day | ORAL | Status: DC
  Administered 2023-04-24 – 2023-04-25 (×2): 600 mg via ORAL
  Filled 2023-04-24 (×2): qty 2

## 2023-04-24 MED ORDER — ENOXAPARIN SODIUM 40 MG/0.4ML IJ SOSY
40.0000 mg | PREFILLED_SYRINGE | INTRAMUSCULAR | Status: DC
  Administered 2023-04-24 – 2023-04-25 (×2): 40 mg via SUBCUTANEOUS
  Filled 2023-04-24 (×2): qty 0.4

## 2023-04-24 MED ORDER — NICOTINE POLACRILEX 2 MG MT GUM: 2.0000 mg | CHEWING_GUM | OROMUCOSAL | Status: DC | PRN

## 2023-04-24 MED ORDER — ASPIRIN 81 MG PO CHEW
324.0000 mg | CHEWABLE_TABLET | Freq: Once | ORAL | Status: AC
  Administered 2023-04-24: 324 mg via ORAL
  Filled 2023-04-24: qty 4

## 2023-04-24 MED ORDER — ASPIRIN 81 MG PO CHEW
81.0000 mg | CHEWABLE_TABLET | Freq: Every day | ORAL | Status: DC
  Administered 2023-04-25 – 2023-04-26 (×2): 81 mg via ORAL
  Filled 2023-04-24 (×2): qty 1

## 2023-04-24 MED ORDER — INSULIN ASPART 100 UNIT/ML IJ SOLN
0.0000 [IU] | Freq: Three times a day (TID) | INTRAMUSCULAR | Status: DC
  Administered 2023-04-26: 1 [IU] via SUBCUTANEOUS

## 2023-04-24 MED ORDER — STROKE: EARLY STAGES OF RECOVERY BOOK
Freq: Once | Status: AC
  Filled 2023-04-24: qty 1

## 2023-04-24 MED ORDER — SODIUM CHLORIDE 0.9% FLUSH
3.0000 mL | Freq: Once | INTRAVENOUS | Status: AC
  Administered 2023-04-24: 3 mL via INTRAVENOUS

## 2023-04-24 MED ORDER — EZETIMIBE 10 MG PO TABS
10.0000 mg | ORAL_TABLET | Freq: Every day | ORAL | Status: DC
  Administered 2023-04-25 – 2023-04-26 (×2): 10 mg via ORAL
  Filled 2023-04-24 (×2): qty 1

## 2023-04-24 NOTE — Code Documentation (Signed)
Autumn Walker is a 72 yr old female with a PMH DM, CKD, HLD arriving to [facility] via EMS on 04/24/2023. Pt is coming from home, where she was last known well at unspecified time yesterday. Since then she has been having varying degrees of expressive and receptive aphasia. Code stroke alert activated by EMS. Pt is not on any thinners.    Pt met at bridge by stroke team. Labs, CBG obtained. Airway cleared by EDP. Pt to CT with team. NIHSS 0. The following imaging was completed: CT, CTA/P. Per Dr. Wilford Corner, CT is negative for acute hemorrhage. 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 Perfusion profile 0 core, 9 penumbra. With pt NIHSS 0, and unfavorable mismatch profile, pt is ineligible for Thrombectomy. She is OOW for TNK.    Pt to ED room 34 by RRN.

## 2023-04-24 NOTE — H&P (Signed)
History and Physical    Autumn Walker ZOX:[id number] DOB: July 28, 1950 DOA: 04/24/2023  PCP: [name], PA   Patient coming from: Home  Chief Complaint:  Chief Complaint  Patient presents with   Code Stroke    Onset yesterday with intermitent word searching while watching tv. Pt reports it did subside. Around 1500 today. Pt developed a HA and around 1600 became aphasic. No thinners. Pt for ems was aphasic but upon arrival was had a GCS of 15 and NIHSS of 0.     HPI:  Autumn Walker is a 72 y.o. female with hx of current smoking, hypertension, hyperlipidemia, CKD 3, COPD, who was brought in by EMS due to recurrent aphasia.  Last known well time 10/13 approximately 3 PM per report to other providers.  On my interview actually said symptoms started later around 4 to 5 PM.  She was watching [place] movies and suddenly was unable to comprehend speech.  Associated she also had expressive aphasia, says she was unable to tell her husband what groceries she wanted from the store.  Her symptoms continued until approximately 10 PM when suddenly they abated.  Also reports a transient episode of left eye central visual loss yesterday which resolved.  Also had episode of palpitations. she felt mostly back to her normal and could comprehend and express herself better.  This morning remained near her baseline, but at 3/330 pm she again developed similar symptoms of expressive aphasia and sought care.  She reports associated headache today.  On my interview reported initially left sided blurry vision.  Denies any loss of vision on the left or right, or central visual loss.  Otherwise has been in normal state of health.  No prior history ASCVD, no known arrhythmia. she is a current smoker at 1/4 pack a day, and is motivated to quit.   Review of Systems:  ROS complete and negative except as marked above   Allergies  Allergen Reactions   Statins     Severe leg cramps   Codeine Nausea  Only    Prior to Admission medications   Medication Sig Start Date End Date Taking? Authorizing Provider  albuterol (VENTOLIN HFA) 108 (90 Base) MCG/ACT inhaler INHALE 1-2 PUFFS INTO THE LUNGS EVERY 4 (FOUR) HOURS AS NEEDED FOR WHEEZING OR SHORTNESS OF BREATH. 08/17/20  Yes Abonza, Maritza, PA-C  calcium carbonate (TUMS EX) 750 MG chewable tablet Chew 2 tablets by mouth daily as needed for heartburn (Indigestion).   Yes [provider]  ezetimibe (ZETIA) 10 MG tablet TAKE 1 TABLET (10 MG TOTAL) BY MOUTH DAILY AFTER SUPPER. 04/24/23  Yes Edstrom, Morgan A, PA  fluticasone (FLONASE) 50 MCG/ACT nasal spray USE 1 SPRAY IN EACH NOSTRIL TWICE DAILY FOLLOWING SINUS RINSES 02/05/19  Yes [name], Xiao, DO  gabapentin (NEURONTIN) 300 MG capsule TAKE 1 CAPSULE IN AM, 1 CAPSULE IN AFTERNOON AND 2 CAPSULES AT BEDTIME Patient taking differently: Take 2 capsules by mouth at bedtime. 01/06/23  Yes [name] A, PA  ibuprofen (ADVIL,MOTRIN) 200 MG tablet Take 600 mg by mouth every 6 (six) hours as needed for mild pain (pain score 1-3).   Yes [provider]  losartan (COZAAR) 25 MG tablet TAKE 1 TABLET (25 MG TOTAL) BY MOUTH DAILY. 04/14/23  Yes [name] A, PA  metFORMIN (GLUCOPHAGE) 1000 MG tablet Take 1 tablet (1,000 mg total) by mouth 2 (two) times daily with a meal. 01/13/23  Yes Edstrom, Morgan A, PA  montelukast (SINGULAIR) 10 MG  tablet TAKE 1 TABLET BY MOUTH EVERYDAY AT BEDTIME Patient taking differently: Take 1 tablet by mouth daily as needed (COPD). 04/28/22  Yes Abonza, Maritza, PA-C  OVER THE COUNTER MEDICATION Take 2-3 tablets by mouth every 4 (four) hours as needed (leg cramps). : Medication name-[facility] Homeopathic:  Place 2-3 tabs under tongue every 4 hours . If needed, place 2-3 additional tablets under the tongue every 15 mins for up to 6 doses.   Yes [provider]  Vitamin D, Ergocalciferol, (DRISDOL) 1.25 MG (50000 UNIT) CAPS capsule Take 50,000 Units by mouth  every 7 (seven) days. Take on Saturdays   Yes [provider]    Past Medical History:  Diagnosis Date   Allergy    Blood transfusion without reported diagnosis    Cataract    Diabetes mellitus without complication (HCC)     Past Surgical History:  Procedure Laterality Date   APPENDECTOMY     CHOLECYSTECTOMY     PANCREATECTOMY     SPLENECTOMY, TOTAL       reports that she has been smoking cigarettes. She has been exposed to tobacco smoke. She has never used smokeless tobacco. She reports that she does not drink alcohol and does not use drugs.  Family History  Problem Relation Age of Onset   Thyroid disease Mother    Heart disease Father    Alcohol abuse Father    Heart attack Father    Hyperlipidemia Father    Depression Sister    Thyroid disease Sister    Thyroid disease Daughter    Diabetes Maternal Grandmother    Heart disease Maternal Grandfather    Cancer Paternal Grandfather        prostate   Hypertension Paternal Grandfather    Alcohol abuse Brother    Colon cancer Neg Hx    Breast cancer Neg Hx      Physical Exam: Vitals:   04/24/23 1935 04/24/23 2015 04/24/23 2030 04/24/23 2135  BP:  (!) 108/53 (!) 110/53 101/67  Pulse:  88 96 (!) 111  Resp:  17 (!) 23 (!) 24  Temp: 97.9 F (36.6 C)     TempSrc: Oral     SpO2:  100% 92%   Weight:        Gen: Awake, alert, NAD CV: Regular, normal S1, S2, 2/6 holosystolic murmur to the apex Resp: Normal WOB, diminished air movement, scattered wheeze Abd: Flat, normoactive, nontender MSK: Symmetric, no edema  Skin: No rashes or lesions to exposed skin  Neuro: Alert and interactive, fully oriented. + Expressive aphasia.  CN II through XII intact, VA intact to count fingers, visual fields were intact 4 quadrants to confrontation, motor is 5 out of 5 and symmetric throughout.  Sensation is intact and equal to fine touch Psych: Tearful discussing stroke diagnosis  Data review:   Labs reviewed, notable for:    Chemistries unremarkable Blood glucose within normal limit WBC 13 Macrocytosis without anemia  Micro:  Results for orders placed or performed in visit on 03/29/19  Novel Coronavirus, NAA ([facility])     Status: None   Collection Time: 03/29/19 12:00 AM   Specimen: Oropharyngeal(OP) collection in vial transport medium   OROPHARYNGEA  TESTING  Result Value Ref Range Status   SARS-CoV-2, NAA Not Detected Not Detected Final    Comment: Testing was performed using the cobas(R) SARS-CoV-2 test. This nucleic acid amplification test was developed and its performance characteristics determined by [employer]. Nucleic acid amplification tests include PCR and  TMA. This test has not been FDA cleared or approved. This test has been authorized by FDA under an Emergency Use Authorization (EUA). This test is only authorized for the duration of time the declaration that circumstances exist justifying the authorization of the emergency use of in vitro diagnostic tests for detection of SARS-CoV-2 virus and/or diagnosis of COVID-19 infection under section 564(b)(1) of the Act, 21 U.S.C. 161WRU-0(A) (1), unless the authorization is terminated or revoked sooner. When diagnostic testing is negative, the possibility of a false negative result should be considered in the context of a patient's recent exposures and the presence of clinical signs and symptoms consistent with COVID-19. An individual without symptoms  of COVID-19 and who is not shedding SARS-CoV-2 virus would expect to have a negative (not detected) result in this assay.     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 Imaging results were communicated on 04/24/2023 at 7:45  pm to provider Dr. Wilford Corner via secure text paging. Electronically Signed   By: Wiliam Ke M.D.   On: 04/24/2023 19:46   Result Date: 04/24/2023 CLINICAL DATA:  Aphasia, stroke suspected EXAM: CT ANGIOGRAPHY HEAD AND NECK WITH AND WITHOUT CONTRAST CT PERFUSION TECHNIQUE: Multidetector CT imaging of the head and neck was performed using the standard protocol during bolus administration of intravenous contrast. Multiplanar CT image reconstructions and MIPs were obtained to evaluate the vascular anatomy. Carotid stenosis measurements (when applicable) are obtained utilizing NASCET criteria, using the distal internal carotid diameter as the denominator. Multiphase CT imaging of the brain was performed following IV bolus contrast injection. Subsequent parametric perfusion maps were calculated using RAPID software. RADIATION DOSE REDUCTION: This exam was performed according to the departmental dose-optimization program which includes automated exposure control, adjustment of the mA and/or kV according to patient size and/or use of iterative reconstruction technique. CONTRAST:  75 mL Omnipaque 350 COMPARISON:  No prior CTA available. FINDINGS: CT HEAD FINDINGS For noncontrast findings, please see same day CT head. CTA NECK FINDINGS Aortic arch: Standard branching. Imaged portion shows no evidence of aneurysm or dissection. No significant stenosis of the major arch vessel origins. Aortic atherosclerosis. Right carotid system: No evidence of dissection, occlusion, or hemodynamically significant stenosis (greater than 50%). Left carotid system: No evidence of dissection, occlusion, or hemodynamically significant stenosis (greater than 50%). Vertebral arteries: No evidence of dissection, occlusion, or hemodynamically significant stenosis (greater than 50%). Skeleton: No acute osseous abnormality. Degenerative changes in the cervical spine. Other neck: No acute finding. Upper chest: Centrilobular nodules, likely smoking  related lung disease. Emphysematous changes. No focal pulmonary opacity or pleural effusion. Review of the MIP images confirms the above findings CTA HEAD FINDINGS Anterior circulation: Both internal carotid arteries are patent to the termini, with moderate stenosis in the right supraclinoid ICA. A1 segments patent, hypoplastic on the right. Normal anterior communicating artery. Anterior cerebral arteries are patent to their distal aspects without significant stenosis. No M1 stenosis or occlusion. 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 Right MCA branches perfused to their distal  aspects without significant stenosis. Posterior circulation: Vertebral arteries patent to the vertebrobasilar junction without significant stenosis. Posterior inferior cerebellar arteries patent proximally. Basilar patent to its distal aspect without significant stenosis. Superior cerebellar arteries patent proximally. Patent left P1. Fetal origin of the right PCA from the right posterior communicating artery. PCAs perfused to their distal aspects without significant stenosis. Venous sinuses: As permitted by contrast timing, patent. Anatomic variants: Fetal origin of the right PCA. No evidence of aneurysm or vascular malformation. Review of the MIP images confirms the above findings CT Brain Perfusion Findings: ASPECTS: 8 CBF (<30%) Volume: 0mL Perfusion (Tmax>6.0s) volume: 9mL Mismatch Volume: 9mL Infarction Location:No infarct core; area of decreased perfusion in posterior left MCA territory, which does not correlate with the area of hypodensity on the same-day CT. IMPRESSION: 1. Occlusion of the left M2 insular branch, with reconstitution and intermittent poor perfusion of the more distal left M2. 2. No other  intracranial large vessel occlusion or significant stenosis. 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 3. No hemodynamically significant stenosis in the neck. 4. No infarct core; area of decreased perfusion in the posterior left MCA territory, which does not correlate with the area of hypodensity on the same-day CT and is favored to be related to the poor perfusion seen in the more distal left MCA. Aortic Atherosclerosis (ICD10-I70.0) and Emphysema (ICD10-J43.9). Imaging results were communicated on 04/24/2023 at 7:28 pm to provider Dr. Wilford Corner via secure text paging. Electronically Signed: By: Wiliam Ke M.D. On: 04/24/2023 19:35   CT HEAD CODE STROKE WO CONTRAST  Result Date: 04/24/2023 CLINICAL DATA:  Code stroke.  Aphasia EXAM: CT HEAD WITHOUT CONTRAST TECHNIQUE: Contiguous axial images were obtained from the base of the skull through the vertex without intravenous contrast. RADIATION DOSE REDUCTION: This exam was performed according to the departmental dose-optimization program which includes automated exposure control, adjustment of the mA and/or kV according to patient size and/or use of iterative reconstruction technique. COMPARISON:  None Available. FINDINGS: Brain: Hypodensity in the anterior left temporal lobe (series 2, image 9). Some loss of the left insular ribbon (series 2, image 12). No hemorrhage, mass, mass effect, or midline shift. No hydrocephalus or extra-axial collection. Vascular: S. Mall focus of hyperdensity in the left MCA (series 2, image 10). Skull: Negative for fracture or focal lesion. Sinuses/Orbits: Mucosal thickening in the ethmoid air cells. No acute finding in the orbits. Other: The mastoid air cells are well aerated. ASPECTS [facility] Stroke Program Early CT Score) - Ganglionic level infarction (caudate, lentiform nuclei, internal capsule, insula, M1-M3 cortex): 5 - Supraganglionic infarction (M4-M6 cortex): 3 Total score (0-10 with 10 being normal): 8 . IMPRESSION: 1.  Hypodensity in the anterior left temporal lobe, with some loss of the left insular ribbon, concerning for acute infarct. ASPECTS is 8. 2. No intracranial hemorrhage. Imaging results were communicated on 04/24/2023 at 7:23 pm to provider Dr. Wilford Corner via secure text paging. Electronically Signed   By: Wiliam Ke M.D.   On: 04/24/2023 19:09    EKG:  Sinus rhythm, PRWP, no acute ischemic changes  ED Course:  Arrived as a stroke alert, evaluated by neurology.  Imaging obtained including CTA head and neck, CT perfusion, CT head; demonstrating left M2 occlusion with distal reconstitution.  CT head with hypointensity left insular ribbon and left anterior temporal lobe.  CT perfusion with area of hypoperfusion in the distal left MCA territory 9 cc penumbra.  She was out of the window for TNK and thrombectomy.  Assessment/Plan:  72 y.o. female with hx current smoking, hypertension, hyperlipidemia, CKD, COPD, who was brought in by EMS due to recurrent aphasia. Found to have acute ischemic stroke with Left M2 occlusion.   Acute ischemic stroke, with left M2 insular branch occlusion Left eye central visual loss, resolved - possible aborted L CRAO  LKWT 10/13 at 3 PM with onset of expressive aphasia. Also reported left eye central visual scotoma which has resolved. Transient symptom improvement after 10/13 at 10 PM, but recurrent 10/14 at 3 PM. NIHSS 1. Stroke alert with imaging demonstrating Left M2 insular branch occlusion with distal reconstitution.  CT head with hypointensity left insular ribbon and left anterior temporal lobe.  CT perfusion with area of hypoperfusion in the distal left MCA territory 9 cc penumbra.  She was out of the window for TNK and thrombectomy. Otherwise vessel imaging notable for incidental moderate RICA supraclinoid stenosis. Etiology of stroke is large vessel occlusion suspected cardioembolic.  - Neurology following, out of window for TNK or thrombectomy  - Antiplatelet with  aspirin 324 mg x 1 load then 81 mg daily   - Increase Statin, rosuvastatin 40 mg daily   - Permissive HTN <220/120  - Check lipids and A1c - Serial neurochecks  - Telemonitoring - TTE with bubble - PT/OT/SLP - DVT prophylaxis per below - Swallow screen then [facility] diet  - Advised on smoking cessation   Smoking cessation  Current smoker at 1 quart a pack a day -Nicotine gum 2 mg prn cravings, ready to quit. Counseled on cessation   Chronic medical problems:  Hypertension: Holding home losartan for permissive hypertension Hyperlipidemia: See statin above COPD: Continue home albuterol prn, montelukast..  Smoking cessation above  Body mass index is 24.67 kg/m.    DVT prophylaxis:  Lovenox Code Status:  Full Code Diet:  Diet Orders (From admission, onward)     Start     Ordered   04/24/23 2139  Diet Heart Room service appropriate? Yes; Fluid consistency: Thin  Diet effective now       Question Answer Comment  Room service appropriate? Yes   Fluid consistency: Thin      04/24/23 2144           Family Communication: Yes discussed with her husband, 2 children and son-in-law at bedside Consults:  Neurology   Admission status:   Inpatient, Telemetry bed  Severity of Illness: The appropriate patient status for this patient is INPATIENT. Inpatient status is judged to be reasonable and necessary in order to provide the required intensity of service to ensure the patient's safety. The patient's presenting symptoms, physical exam findings, and initial radiographic and laboratory data in the context of their chronic comorbidities is felt to place them at high risk for further clinical deterioration. Furthermore, it is not anticipated that the patient will be medically stable for discharge from the hospital within 2 midnights of admission.   * I certify that at the point of admission it is my clinical judgment that the patient will require inpatient hospital care spanning beyond 2  midnights from the point of admission due to high intensity of service, high risk for further deterioration and high frequency of surveillance required.*   Dolly Rias, MD [facility]  How to contact the [facility] Attending or Consulting provider 7A - 7P or covering provider during after hours 7P -7A, for this patient.  Check the care team in [facility] and look for a) attending/consulting [facility] provider listed and b) the [facility] team  listed Log into [email or web address] and use [facility]'s universal password to access. If you do not have the password, please contact the hospital operator. Locate the [facility] provider you are looking for under [facility] and page to a number that you can be directly reached. If you still have difficulty reaching the provider, please page the [facility] (Director on Call) for the Hospitalists listed on amion for assistance.  04/24/2023, 9:44 PM

## 2023-04-24 NOTE — ED Provider Notes (Signed)
[place] EMERGENCY DEPARTMENT AT [facility] Provider Note   CSN: [id number] Arrival date & time: 04/24/23  1847  An emergency department physician performed an initial assessment on this suspected stroke patient at 1845.  History  Chief Complaint  Patient presents with   Code Stroke    Onset yesterday with intermitent word searching while watching tv. Pt reports it did subside. Around 1500 today. Pt developed a HA and around 1600 became aphasic. No thinners. Pt for ems was aphasic but upon arrival was had a GCS of 15 and NIHSS of 0.     Autumn Walker is a 72 y.o. female.  HPI  72 year old female history of diabetes, and tobacco use disorder presented to the ED due to concerns for strokelike symptoms that happened yesterday.  Patient says she experience some headache and felt she was weak on her right side of her body.  Her husband who was on the bedside also thought she was acting funny decided bring the patient to the ED. Denies any chest pain, shortness of breath or any recent falls or trauma.  Home Medications Prior to Admission medications   Medication Sig Start Date End Date Taking? Authorizing Provider  albuterol (VENTOLIN HFA) 108 (90 Base) MCG/ACT inhaler INHALE 1-2 PUFFS INTO THE LUNGS EVERY 4 (FOUR) HOURS AS NEEDED FOR WHEEZING OR SHORTNESS OF BREATH. 08/17/20  Yes Abonza, Maritza, PA-C  calcium carbonate (TUMS EX) 750 MG chewable tablet Chew 2 tablets by mouth daily as needed for heartburn (Indigestion).   Yes [provider]  ezetimibe (ZETIA) 10 MG tablet TAKE 1 TABLET (10 MG TOTAL) BY MOUTH DAILY AFTER SUPPER. 04/24/23  Yes Edstrom, Morgan A, PA  fluticasone (FLONASE) 50 MCG/ACT nasal spray USE 1 SPRAY IN EACH NOSTRIL TWICE DAILY FOLLOWING SINUS RINSES 02/05/19  Yes [name], Kayela, DO  gabapentin (NEURONTIN) 300 MG capsule TAKE 1 CAPSULE IN AM, 1 CAPSULE IN AFTERNOON AND 2 CAPSULES AT BEDTIME Patient taking differently: Take 2 capsules by  mouth at bedtime. 01/06/23  Yes [name] A, PA  ibuprofen (ADVIL,MOTRIN) 200 MG tablet Take 600 mg by mouth every 6 (six) hours as needed for mild pain (pain score 1-3).   Yes [provider]  losartan (COZAAR) 25 MG tablet TAKE 1 TABLET (25 MG TOTAL) BY MOUTH DAILY. 04/14/23  Yes [name] A, PA  metFORMIN (GLUCOPHAGE) 1000 MG tablet Take 1 tablet (1,000 mg total) by mouth 2 (two) times daily with a meal. 01/13/23  Yes Edstrom, Morgan A, PA  montelukast (SINGULAIR) 10 MG tablet TAKE 1 TABLET BY MOUTH EVERYDAY AT BEDTIME Patient taking differently: Take 1 tablet by mouth daily as needed (COPD). 04/28/22  Yes Abonza, Maritza, PA-C  OVER THE COUNTER MEDICATION Take 2-3 tablets by mouth every 4 (four) hours as needed (leg cramps). : Medication name-[employer]'s Homeopathic:  Place 2-3 tabs under tongue every 4 hours . If needed, place 2-3 additional tablets under the tongue every 15 mins for up to 6 doses.   Yes [provider]  Vitamin D, Ergocalciferol, (DRISDOL) 1.25 MG (50000 UNIT) CAPS capsule Take 50,000 Units by mouth every 7 (seven) days. Take on Saturdays   Yes [provider]      Allergies    Statins and Codeine    Review of Systems   Review of Systems  Physical Exam Updated Vital Signs BP (!) 128/54   Pulse 96   Temp 97.9 F (36.6 C) (Oral)   Resp 20   Wt 60.2 kg  SpO2 94%   BMI 24.67 kg/m  Physical Exam  ED Results / Procedures / Treatments   Labs (all labs ordered are listed, but only abnormal results are displayed) Labs Reviewed  CBC - Abnormal; Notable for the following components:      Result Value   WBC 13.7 (*)    MCV 100.2 (*)    All other components within normal limits  DIFFERENTIAL - Abnormal; Notable for the following components:   Lymphs Abs 4.7 (*)    Monocytes Absolute 1.1 (*)    All other components within normal limits  COMPREHENSIVE METABOLIC PANEL - Abnormal; Notable for the following components:   CO2 21 (*)     Glucose, Bld 103 (*)    Alkaline Phosphatase 37 (*)    All other components within normal limits  I-STAT CHEM 8, ED - Abnormal; Notable for the following components:   Glucose, Bld 102 (*)    Calcium, Ion 1.14 (*)    TCO2 20 (*)    All other components within normal limits  CBG MONITORING, ED - Abnormal; Notable for the following components:   Glucose-Capillary 102 (*)    All other components within normal limits  PROTIME-INR  APTT  ETHANOL  BASIC METABOLIC PANEL  CBC  MAGNESIUM  PHOSPHORUS  LIPID PANEL  HEMOGLOBIN A1C  CBG MONITORING, ED    EKG None  Radiology CT ANGIO HEAD NECK W WO CM W PERF (CODE STROKE)  Addendum Date: 04/24/2023   ADDENDUM REPORT: 04/24/2023 19:46 ADDENDUM: 2 mm superiorly directed outpouching from the right aspect of the anterior communicating artery (series 5, image 99 and series 13, image 82), 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 Imaging results were communicated on 04/24/2023 at 7:45 pm to provider Dr. Wilford Corner via secure text paging. Electronically Signed   By: Wiliam Ke M.D.   On: 04/24/2023 19:46   Result Date: 04/24/2023 CLINICAL DATA:  Aphasia, stroke suspected EXAM: CT ANGIOGRAPHY HEAD AND NECK WITH AND WITHOUT CONTRAST CT PERFUSION TECHNIQUE: Multidetector CT imaging of the head and neck was performed using the standard protocol during bolus administration of intravenous contrast. Multiplanar CT image reconstructions and MIPs were obtained to evaluate the vascular anatomy. Carotid stenosis measurements (when applicable) are obtained utilizing NASCET criteria, using the distal internal carotid diameter as the denominator. Multiphase CT imaging of the brain was performed following IV bolus contrast injection. Subsequent parametric perfusion maps were calculated using RAPID software. RADIATION DOSE REDUCTION: This exam was performed according to the departmental dose-optimization program which includes automated exposure control, adjustment of the mA and/or kV  according to patient size and/or use of iterative reconstruction technique. CONTRAST:  75 mL Omnipaque 350 COMPARISON:  No prior CTA available. FINDINGS: CT HEAD FINDINGS For noncontrast findings, please see same day CT head. CTA NECK FINDINGS Aortic arch: Standard branching. Imaged portion shows no evidence of aneurysm or dissection. No significant stenosis of the major arch vessel origins. Aortic atherosclerosis. Right carotid system: No evidence of dissection, occlusion, or hemodynamically significant stenosis (greater than 50%). Left carotid system: No evidence of dissection, occlusion, or hemodynamically significant stenosis (greater than 50%). Vertebral arteries: No evidence of dissection, occlusion, or hemodynamically significant stenosis (greater than 50%). Skeleton: No acute osseous abnormality. Degenerative changes in the cervical spine. Other neck: No acute finding. Upper chest: Centrilobular nodules, likely smoking related lung disease. Emphysematous changes. No focal pulmonary opacity or pleural effusion. Review of the MIP images confirms the above findings CTA HEAD FINDINGS Anterior circulation: Both internal carotid arteries are  patent to the termini, with moderate stenosis in the right supraclinoid ICA. A1 segments patent, hypoplastic on the right. Normal anterior communicating artery. Anterior cerebral arteries are patent to their distal aspects without significant stenosis. No M1 stenosis or occlusion. 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 Right MCA branches perfused to their distal aspects without significant stenosis. Posterior circulation: Vertebral arteries patent to the vertebrobasilar junction without significant stenosis. Posterior inferior cerebellar arteries  patent proximally. Basilar patent to its distal aspect without significant stenosis. Superior cerebellar arteries patent proximally. Patent left P1. Fetal origin of the right PCA from the right posterior communicating artery. PCAs perfused to their distal aspects without significant stenosis. Venous sinuses: As permitted by contrast timing, patent. Anatomic variants: Fetal origin of the right PCA. No evidence of aneurysm or vascular malformation. Review of the MIP images confirms the above findings CT Brain Perfusion Findings: ASPECTS: 8 CBF (<30%) Volume: 0mL Perfusion (Tmax>6.0s) volume: 9mL Mismatch Volume: 9mL Infarction Location:No infarct core; area of decreased perfusion in posterior left MCA territory, which does not correlate with the area of hypodensity on the same-day CT. IMPRESSION: 1. Occlusion of the left M2 insular branch, with reconstitution and intermittent poor perfusion of the more distal left M2. 2. No other intracranial large vessel occlusion or significant stenosis. 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 3. No hemodynamically significant stenosis in the neck. 4. No infarct core; area of decreased perfusion in the posterior left MCA territory, which does not correlate with the area of hypodensity on the same-day CT and is favored to be related to the poor perfusion seen in the more distal left MCA. Aortic Atherosclerosis (ICD10-I70.0) and Emphysema (ICD10-J43.9). Imaging results were communicated on 04/24/2023 at 7:28 pm to provider Dr. Wilford Corner via secure text paging. Electronically Signed: By: Wiliam Ke M.D. On: 04/24/2023 19:35   CT HEAD CODE STROKE WO CONTRAST  Result Date: 04/24/2023 CLINICAL DATA:  Code stroke.  Aphasia EXAM: CT HEAD WITHOUT CONTRAST TECHNIQUE: Contiguous axial images were obtained from the base of the skull through the vertex without intravenous contrast. RADIATION DOSE REDUCTION: This exam was performed according to the departmental dose-optimization  program which includes automated exposure control, adjustment of the mA and/or kV according to patient size and/or use of iterative reconstruction technique. COMPARISON:  None Available. FINDINGS: Brain: Hypodensity in the anterior left temporal lobe (series 2, image 9). Some loss of the left insular ribbon (series 2, image 12). No hemorrhage, mass, mass effect, or midline shift. No hydrocephalus or extra-axial collection. Vascular: S. Mall focus of hyperdensity in the left MCA (series 2, image 10). Skull: Negative for fracture or focal lesion. Sinuses/Orbits: Mucosal thickening in the ethmoid air cells. No acute finding in the orbits. Other: The mastoid air cells are well aerated. ASPECTS [facility] Stroke Program Early CT Score) - Ganglionic level infarction (caudate, lentiform nuclei, internal capsule, insula, M1-M3 cortex): 5 - Supraganglionic infarction (M4-M6 cortex): 3 Total score (0-10 with 10 being normal): 8 . IMPRESSION: 1. Hypodensity in the anterior left temporal lobe, with some loss of the left insular ribbon, concerning for acute infarct. ASPECTS is 8. 2. No intracranial hemorrhage. Imaging results were communicated on 04/24/2023 at 7:23 pm to provider Dr. Wilford Corner via secure text paging. Electronically Signed   By: Wiliam Ke  M.D.   On: 04/24/2023 19:09    Procedures Procedures    Medications Ordered in ED Medications  enoxaparin (LOVENOX) injection 40 mg (40 mg Subcutaneous Given 04/24/23 2202)   stroke: early stages of recovery book (has no administration in time range)  acetaminophen (TYLENOL) tablet 1,000 mg (1,000 mg Oral Given 04/24/23 2202)  nicotine polacrilex (NICORETTE) gum 2 mg (has no administration in time range)  aspirin chewable tablet 81 mg (has no administration in time range)  rosuvastatin (CRESTOR) tablet 40 mg (40 mg Oral Not Given 04/24/23 2203)  ezetimibe (ZETIA) tablet 10 mg (has no administration in time range)  gabapentin (NEURONTIN) capsule 600 mg (600 mg Oral  Given 04/24/23 2200)  albuterol (PROVENTIL) (2.5 MG/3ML) 0.083% nebulizer solution 3 mL (has no administration in time range)  montelukast (SINGULAIR) tablet 10 mg (has no administration in time range)  insulin aspart (novoLOG) injection 0-6 Units (has no administration in time range)  sodium chloride flush (NS) 0.9 % injection 3 mL (3 mLs Intravenous Given 04/24/23 1928)  iohexol (OMNIPAQUE) 350 MG/ML injection 75 mL (75 mLs Intravenous Contrast Given 04/24/23 1934)  sodium chloride 0.9 % bolus 1,000 mL (0 mLs Intravenous Stopped 04/24/23 2204)  aspirin chewable tablet 324 mg (324 mg Oral Given 04/24/23 2200)    ED Course/ Medical Decision Making/ A&P                                 Medical Decision Making Amount and/or Complexity of Data Reviewed Labs: ordered. Radiology: ordered.  Risk Decision regarding hospitalization.  This patient is a 72 y.o. female who presents to the ED for concern of stroke, this involves an extensive number of treatment options, and is a complaint that carries with it a high risk of complications and morbidity. The emergent differential diagnosis prior to evaluation includes, but is not limited to TIA, Ischemic stroke, Delirium . This is not an exhaustive differential.  Patient seems to be back to her baseline per family.  CT angio of the head showed occlusion of the left M2 insular branch with reconstitution of intermittent poor perfusion of the more distal left M2 was concerning for a acute  ischemic stroke versus TIA.  Lab Tests: I ordered, and personally interpreted labs.  The pertinent results include:  WBC 13.7 otherwise unremarkable    Imaging Studies: I ordered imaging studies including CT angio . I independently visualized and interpreted imaging which showed Occlusion of the left M2 insular branch, with reconstitution and intermittent poor perfusion of the more distal left M2. I agree with the radiologist interpretation.  Consultations Obtained: I  requested consultation with the neurologist Dr. Amada Jupiter,  and discussed lab and imaging findings as well as pertinent plan - they recommend admitting patient to the hospitalist with a close follow-up since this is the first time patient is ever having these symptoms.   Disposition: After consideration of the diagnostic results and the patients response to treatment, I feel that emergency department workup does  suggest an emergent condition requiring admission or immediate intervention beyond what has been performed at this time. The plan is admit.  The on-call hospitalist was consulted and he is agreeable with the plan to admit.I discussed this case with my attending physician Dr. Jeraldine Loots who cosigned this note including patient's presenting symptoms, physical exam, and planned diagnostics and interventions. Attending physician stated agreement with plan or made changes to plan which were implemented.  Final Clinical Impression(s) / ED Diagnoses Final diagnoses:  None    Rx / DC Orders ED Discharge Orders     None         Kathleen Lime, MD 04/24/23 2205    Gerhard Munch, MD 04/24/23 2313    Gerhard Munch, MD 05/02/23 [phone]

## 2023-04-24 NOTE — ED Notes (Signed)
CBG 87.

## 2023-04-24 NOTE — Consult Note (Signed)
Neurology Consultation  Reason for Consult: code stroke - aphasia Referring Physician: Dr Jeraldine Loots  CC: Word finding difficulty  History is obtained from: Patient, chart  HPI: Autumn Walker is a 72 y.o. female past medical history of diabetes, tobacco abuse, hyperlipidemia presenting to the emergency department for greater than 24 hours worth of word finding difficulty with off-and-on symptoms. Reports that yesterday somewhere around 3 PM she had difficulty with word finding-husband had gone to the store and she wanted to tell him to bring some things and could not get names of the things out.  Also reported feeling that she could not read and comprehend the text on the TV.  This got somewhat better but did not go back to 100% baseline but over.  Of the night she was able to communicate decently with the family members.  This afternoon again around 4 PM, her symptoms became worse and she had difficulty with her words-especially communicating what she wanted to say. She was brought in as an acute code stroke initially with a last known well at 4 PM today but it turns out that last known well is greater than 24 hours ago-3 PM yesterday. Denies any previous similar symptoms. Blood pressure was reportedly higher than her norm.  She normally stays systolic 100-120 but today her blood pressures were in the 140s. She had felt yesterday that she might have a stroke but did not come to the hospital then Her daughter and husband were at bedside.  Daughter is a EMT.   LKW: 3 PM yesterday IV thrombolysis given?: no, outside the window EVT: Minimal symptoms, outside the window Premorbid modified Rankin scale (mRS):0   ROS: Full ROS was performed and is negative except as noted in the HPI.  Past Medical History:  Diagnosis Date   Allergy    Blood transfusion without reported diagnosis    Cataract    Diabetes mellitus without complication (HCC)     Family History  Problem Relation Age  of Onset   Thyroid disease Mother    Heart disease Father    Alcohol abuse Father    Heart attack Father    Hyperlipidemia Father    Depression Sister    Thyroid disease Sister    Thyroid disease Daughter    Diabetes Maternal Grandmother    Heart disease Maternal Grandfather    Cancer Paternal Grandfather        prostate   Hypertension Paternal Grandfather    Alcohol abuse Brother    Colon cancer Neg Hx    Breast cancer Neg Hx      Social History:   reports that she has been smoking cigarettes. She has been exposed to tobacco smoke. She has never used smokeless tobacco. She reports that she does not drink alcohol and does not use drugs.  Medications No current facility-administered medications for this encounter.  Current Outpatient Medications:    albuterol (VENTOLIN HFA) 108 (90 Base) MCG/ACT inhaler, INHALE 1-2 PUFFS INTO THE LUNGS EVERY 4 (FOUR) HOURS AS NEEDED FOR WHEEZING OR SHORTNESS OF BREATH., Disp: 18 each, Rfl: 0   ezetimibe (ZETIA) 10 MG tablet, TAKE 1 TABLET (10 MG TOTAL) BY MOUTH DAILY AFTER SUPPER., Disp: 90 tablet, Rfl: 0   fluticasone (FLONASE) 50 MCG/ACT nasal spray, USE 1 SPRAY IN EACH NOSTRIL TWICE DAILY FOLLOWING SINUS RINSES, Disp: 48 mL, Rfl: 0   gabapentin (NEURONTIN) 300 MG capsule, TAKE 1 CAPSULE IN AM, 1 CAPSULE IN AFTERNOON AND 2 CAPSULES AT BEDTIME, Disp: 360  capsule, Rfl: 0   ibuprofen (ADVIL,MOTRIN) 200 MG tablet, Take 600 mg by mouth every 6 (six) hours as needed., Disp: , Rfl:    losartan (COZAAR) 25 MG tablet, TAKE 1 TABLET (25 MG TOTAL) BY MOUTH DAILY., Disp: 90 tablet, Rfl: 0   metFORMIN (GLUCOPHAGE) 1000 MG tablet, Take 1 tablet (1,000 mg total) by mouth 2 (two) times daily with a meal., Disp: 180 tablet, Rfl: 1   Methylcobalamin (B12-ACTIVE PO), Take by mouth., Disp: , Rfl:    montelukast (SINGULAIR) 10 MG tablet, TAKE 1 TABLET BY MOUTH EVERYDAY AT BEDTIME, Disp: 90 tablet, Rfl: 1   omega-3 acid ethyl esters (LOVAZA) 1 g capsule, Take 2  capsules (2 g total) by mouth 2 (two) times daily., Disp: 360 capsule, Rfl: 1   rosuvastatin (CRESTOR) 20 MG tablet, Take 1 tablet (20 mg total) by mouth daily., Disp: 90 tablet, Rfl: 1   VITAMIN D PO, Take by mouth. 50000 iu weekly, Disp: , Rfl:    Exam: Current vital signs: BP (!) 128/57   Pulse 93   Temp 97.9 F (36.6 C) (Oral)   Resp 15   Wt 60.2 kg   SpO2 100%   BMI 24.67 kg/m  Vital signs in last 24 hours: Temp:  [97.9 F (36.6 C)] 97.9 F (36.6 C) (10/14 1935) Pulse Rate:  [93] 93 (10/14 1930) Resp:  [15] 15 (10/14 1930) BP: (128-135)/(57-60) 128/57 (10/14 1930) SpO2:  [100 %] 100 % (10/14 1930) Weight:  [60.2 kg] 60.2 kg (10/14 1902)  General: Awake alert in no distress HEENT: Normocephalic atraumatic Lungs: Clear Cardiovascular: Regular rhythm Abdomen nondistended nontender Neurological exam She is awake alert oriented x 3.  There is no dysarthria. She has a very subtle aphasia while describing the NIH stroke scale pictures-she will able to name the images well, she was able to read the words well but while describing the picture with children and the mother, I would have expected her to be more fluent than she was. Cranial nerves II to XII intact Motor examination with no drift in any of the 4 extremities Sensation intact light touch without extinction Coordination with no dysmetria Gait was normal NIH stroke scale-1  Labs I have reviewed labs in epic and the results pertinent to this consultation are:  CBC    Component Value Date/Time   WBC 13.7 (H) 04/24/2023 1848   RBC 4.01 04/24/2023 1848   HGB 13.6 04/24/2023 1856   HGB 13.8 01/06/2023 0823   HCT 40.0 04/24/2023 1856   HCT 43.0 01/06/2023 0823   PLT 299 04/24/2023 1848   PLT 306 01/06/2023 0823   MCV 100.2 (H) 04/24/2023 1848   MCV 100 (H) 01/06/2023 0823   MCH 32.4 04/24/2023 1848   MCHC 32.3 04/24/2023 1848   RDW 13.1 04/24/2023 1848   RDW 11.9 01/06/2023 0823   LYMPHSABS 4.7 (H)  04/24/2023 1848   LYMPHSABS 6.2 (H) 01/06/2023 0823   MONOABS 1.1 (H) 04/24/2023 1848   EOSABS 0.1 04/24/2023 1848   EOSABS 0.5 (H) 01/06/2023 0823   BASOSABS 0.1 04/24/2023 1848   BASOSABS 0.1 01/06/2023 0823    CMP     Component Value Date/Time   NA 135 04/24/2023 1856   NA 140 01/06/2023 0823   K 3.9 04/24/2023 1856   CL 103 04/24/2023 1856   CO2 21 (L) 04/24/2023 1848   GLUCOSE 102 (H) 04/24/2023 1856   BUN 13 04/24/2023 1856   BUN 19 01/06/2023 0823   CREATININE 0.90 04/24/2023  1856   CREATININE 0.67 03/30/2016 0752   CALCIUM 9.4 04/24/2023 1848   PROT 7.2 04/24/2023 1848   PROT 7.0 01/06/2023 0823   ALBUMIN 4.1 04/24/2023 1848   ALBUMIN 4.6 01/06/2023 0823   AST 21 04/24/2023 1848   ALT 14 04/24/2023 1848   ALKPHOS 37 (L) 04/24/2023 1848   BILITOT 0.6 04/24/2023 1848   BILITOT <0.2 01/06/2023 0823   GFRNONAA >60 04/24/2023 1848   GFRNONAA >89 03/30/2016 0752   GFRAA 89 04/09/2020 1134   GFRAA >89 03/30/2016 0752   Imaging I have reviewed the images obtained:  CT-head-there is subtle loss of left insular ribbon concerning for acute infarct.  There is also a hypodensity in the anterior left temporal lobe.  Aspects 8.  CT angiography head and neck, with perfusion study-shows occlusion of the left M2 insular branch with reconstitution and intermittent poor perfusion of the more distal left M2.  No other large vessel intracranial occlusion. 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 On the perfusion-there is no core infarct but there is an area of decreased perfusion in the posterior left MCA territory which does not correlate with the area of hypodensity on the same day CT and is favored to be related to poor perfusion in the more distal left MCA with only a 9 cc penumbra.   Assessment: 72 year old history of diabetes tobacco abuse hyperlipidemia presenting for greater than 24 hours worth of symptoms of word finding difficulty.  On examination has subtle  aphasia. CT head with a hypodensity in the left anterior temporal region and a possible subtle gray-white differentiation loss in the left insular area CT angiography head and neck with a left M2 insular branch occlusion with reconstitution and intermittent poor perfusion more distal left M2/M3 zone.  No core infarct on perfusion imaging in 9 cc penumbra which does not correlate to the hypodensity on the same day CT-likely the cause of her symptoms. She is outside the window even for thrombectomy but even by the perfusion profile-at this point, the risks of obtaining thrombectomy are much higher than the benefits that she might get-especially risk of bleeding. She is outside the window for IV thrombolysis.  Impression: Acute ischemic stroke  Recommendations: Admit to hospitalist Telemetry Frequent rechecks Avoid hypotension.  Keep blood pressure greater than systolic 140.  Treat high blood pressure only if systolics greater than 220 on a as needed basis. Aspirin 81 High intensity statin 2D echo A1c Lipid panel PT OT Speech therapy  If her symptoms worsen-please call the on-call neurologist stat at that time.  This was relayed to the bedside RN.  Stroke team will follow Plan was discussed with Dr. Jeraldine Loots and Dr. Mickie Bail, resident MD in the ED. -- Milon Dikes, MD Neurologist [facility] Pager: [phone]

## 2023-04-25 ENCOUNTER — Encounter (HOSPITAL_COMMUNITY): Payer: Self-pay | Admitting: Internal Medicine

## 2023-04-25 ENCOUNTER — Inpatient Hospital Stay (HOSPITAL_COMMUNITY): Payer: Medicare HMO

## 2023-04-25 DIAGNOSIS — I63512 Cerebral infarction due to unspecified occlusion or stenosis of left middle cerebral artery: Secondary | ICD-10-CM | POA: Diagnosis not present

## 2023-04-25 LAB — MAGNESIUM: Magnesium: 1.7 mg/dL (ref 1.7–2.4)

## 2023-04-25 LAB — BASIC METABOLIC PANEL
Anion gap: 10 (ref 5–15)
BUN: 10 mg/dL (ref 8–23)
CO2: 21 mmol/L — ABNORMAL LOW (ref 22–32)
Calcium: 8.7 mg/dL — ABNORMAL LOW (ref 8.9–10.3)
Chloride: 104 mmol/L (ref 98–111)
Creatinine, Ser: 0.97 mg/dL (ref 0.44–1.00)
GFR, Estimated: >60 mL/min (ref >60)
Glucose, Bld: 103 mg/dL — ABNORMAL HIGH (ref 70–99)
Potassium: 3.7 mmol/L (ref 3.5–5.1)
Sodium: 135 mmol/L (ref 135–145)

## 2023-04-25 LAB — LIPID PANEL
Cholesterol: 99 mg/dL (ref 0–200)
HDL: 40 mg/dL — ABNORMAL LOW (ref >40)
LDL Cholesterol: 34 mg/dL (ref 0–99)
Total CHOL/HDL Ratio: 2.5 {ratio}
Triglycerides: 124 mg/dL (ref <150)
VLDL: 25 mg/dL (ref 0–40)

## 2023-04-25 LAB — CBC
HCT: 36.2 % (ref 36.0–46.0)
Hemoglobin: 11.8 g/dL — ABNORMAL LOW (ref 12.0–15.0)
MCH: 32.2 pg (ref 26.0–34.0)
MCHC: 32.6 g/dL (ref 30.0–36.0)
MCV: 98.9 fL (ref 80.0–100.0)
Platelets: 274 10*3/uL (ref 150–400)
RBC: 3.66 MIL/uL — ABNORMAL LOW (ref 3.87–5.11)
RDW: 13.2 % (ref 11.5–15.5)
WBC: 14.7 10*3/uL — ABNORMAL HIGH (ref 4.0–10.5)
nRBC: 0 % (ref 0.0–0.2)

## 2023-04-25 LAB — HEMOGLOBIN A1C
Hgb A1c MFr Bld: 6.1 % — ABNORMAL HIGH (ref 4.8–5.6)
Mean Plasma Glucose: 128.37 mg/dL

## 2023-04-25 LAB — CBG MONITORING, ED
Glucose-Capillary: 78 mg/dL (ref 70–99)
Glucose-Capillary: 85 mg/dL (ref 70–99)
Glucose-Capillary: 113 mg/dL — ABNORMAL HIGH (ref 70–99)
Glucose-Capillary: 105 mg/dL — ABNORMAL HIGH (ref 70–99)

## 2023-04-25 LAB — PHOSPHORUS: Phosphorus: 3.1 mg/dL (ref 2.5–4.6)

## 2023-04-25 MED ORDER — CLOPIDOGREL BISULFATE 75 MG PO TABS
75.0000 mg | ORAL_TABLET | Freq: Every day | ORAL | Status: DC
  Administered 2023-04-25 – 2023-04-26 (×2): 75 mg via ORAL
  Filled 2023-04-25 (×2): qty 1

## 2023-04-25 MED ORDER — PNEUMOCOCCAL 20-VAL CONJ VACC 0.5 ML IM SUSY
0.5000 mL | PREFILLED_SYRINGE | INTRAMUSCULAR | Status: AC
  Administered 2023-04-26: 0.5 mL via INTRAMUSCULAR
  Filled 2023-04-25: qty 0.5

## 2023-04-25 NOTE — Progress Notes (Addendum)
STROKE TEAM PROGRESS NOTE   BRIEF HPI [name] is a 72 y.o. female with history of diabetes, tobacco abuse, hyperlipidemia, cataracts presenting with >24 hours intermittent word-finding difficulty, agnosia that began 3pm on 10/13. Symptoms improved, but got worse around 4 pm 10/14 with worsening aphasia. No previous episodes. BP was higher than her usual 100-120 systolic.  LKW: 3 PM 10/13 IV thrombolysis given?: no, outside the window EVT: Minimal symptoms, outside the window Premorbid modified Rankin scale (mRS):0  SIGNIFICANT HOSPITAL EVENTS 10/14 - admitted to ED as code stroke. CT saw hypodensity in L temporal lobe, CT Angio saw 2mm aneurysm, left MCA M2 insular branch occlusion/poor perfusion 10/15 - MR Brain:  Acute left MCA territory cortical/subcortical infarct affecting portions of the anterosuperior left temporal lobe and left insula, as described.   INTERIM HISTORY/SUBJECTIVE Met patient in ED with attending physician Dr Roda Shutters. Patient was sitting up in bed. Her husband was with her.   Patient provided history consistent with HPI above except that she reported she had no trouble with sending text messages yesterday. Limitations in function were strictly vocal aphasia. Voiced that she was somewhat better, but still "not normal." She had no new symptoms since coming to the ED. Patient reported a long smoking history. Voiced willingness to consider quitting.    OBJECTIVE  CBC    Component Value Date/Time   WBC 14.7 (H) 04/25/2023 0405   RBC 3.66 (L) 04/25/2023 0405   HGB 11.8 (L) 04/25/2023 0405   HGB 13.8 01/06/2023 0823   HCT 36.2 04/25/2023 0405   HCT 43.0 01/06/2023 0823   PLT 274 04/25/2023 0405   PLT 306 01/06/2023 0823   MCV 98.9 04/25/2023 0405   MCV 100 (H) 01/06/2023 0823   MCH 32.2 04/25/2023 0405   MCHC 32.6 04/25/2023 0405   RDW 13.2 04/25/2023 0405   RDW 11.9 01/06/2023 0823   LYMPHSABS 4.7 (H) 04/24/2023 1848   LYMPHSABS 6.2 (H)  01/06/2023 0823   MONOABS 1.1 (H) 04/24/2023 1848   EOSABS 0.1 04/24/2023 1848   EOSABS 0.5 (H) 01/06/2023 0823   BASOSABS 0.1 04/24/2023 1848   BASOSABS 0.1 01/06/2023 0823    BMET    Component Value Date/Time   NA 135 04/25/2023 0405   NA 140 01/06/2023 0823   K 3.7 04/25/2023 0405   CL 104 04/25/2023 0405   CO2 21 (L) 04/25/2023 0405   GLUCOSE 103 (H) 04/25/2023 0405   BUN 10 04/25/2023 0405   BUN 19 01/06/2023 0823   CREATININE 0.97 04/25/2023 0405   CREATININE 0.67 03/30/2016 0752   CALCIUM 8.7 (L) 04/25/2023 0405   EGFR 43 (L) 01/06/2023 0823   GFRNONAA >60 04/25/2023 0405   GFRNONAA >89 03/30/2016 0752    IMAGING past 24 hours CT ANGIO HEAD NECK W WO CM W PERF (CODE STROKE)  Addendum Date: 04/24/2023   ADDENDUM REPORT: 04/24/2023 19:46 ADDENDUM: 2 mm superiorly directed outpouching from the right aspect of the anterior communicating artery (series 5, image 99 and series 13, image 82), 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 Imaging results were communicated on 04/24/2023 at 7:45 pm to provider Dr. Wilford Corner via secure text paging. Electronically Signed   By: Wiliam Ke M.D.   On: 04/24/2023 19:46   Result Date: 04/24/2023 CLINICAL DATA:  Aphasia, stroke suspected EXAM: CT ANGIOGRAPHY HEAD AND NECK WITH AND WITHOUT CONTRAST CT PERFUSION TECHNIQUE: Multidetector CT imaging of the head and neck was performed using the standard protocol during bolus administration of intravenous contrast. Multiplanar  CT image reconstructions and MIPs were obtained to evaluate the vascular anatomy. Carotid stenosis measurements (when applicable) are obtained utilizing NASCET criteria, using the distal internal carotid diameter as the denominator. Multiphase CT imaging of the brain was performed following IV bolus contrast injection. Subsequent parametric perfusion maps were calculated using RAPID software. RADIATION DOSE REDUCTION: This exam was performed according to the departmental dose-optimization program  which includes automated exposure control, adjustment of the mA and/or kV according to patient size and/or use of iterative reconstruction technique. CONTRAST:  75 mL Omnipaque 350 COMPARISON:  No prior CTA available. FINDINGS: CT HEAD FINDINGS For noncontrast findings, please see same day CT head. CTA NECK FINDINGS Aortic arch: Standard branching. Imaged portion shows no evidence of aneurysm or dissection. No significant stenosis of the major arch vessel origins. Aortic atherosclerosis. Right carotid system: No evidence of dissection, occlusion, or hemodynamically significant stenosis (greater than 50%). Left carotid system: No evidence of dissection, occlusion, or hemodynamically significant stenosis (greater than 50%). Vertebral arteries: No evidence of dissection, occlusion, or hemodynamically significant stenosis (greater than 50%). Skeleton: No acute osseous abnormality. Degenerative changes in the cervical spine. Other neck: No acute finding. Upper chest: Centrilobular nodules, likely smoking related lung disease. Emphysematous changes. No focal pulmonary opacity or pleural effusion. Review of the MIP images confirms the above findings CTA HEAD FINDINGS Anterior circulation: Both internal carotid arteries are patent to the termini, with moderate stenosis in the right supraclinoid ICA. A1 segments patent, hypoplastic on the right. Normal anterior communicating artery. Anterior cerebral arteries are patent to their distal aspects without significant stenosis. No M1 stenosis or occlusion. 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 Right MCA branches perfused to their distal aspects without significant stenosis. Posterior circulation: Vertebral arteries patent to the vertebrobasilar junction  without significant stenosis. Posterior inferior cerebellar arteries patent proximally. Basilar patent to its distal aspect without significant stenosis. Superior cerebellar arteries patent proximally. Patent left P1. Fetal origin of the right PCA from the right posterior communicating artery. PCAs perfused to their distal aspects without significant stenosis. Venous sinuses: As permitted by contrast timing, patent. Anatomic variants: Fetal origin of the right PCA. No evidence of aneurysm or vascular malformation. Review of the MIP images confirms the above findings CT Brain Perfusion Findings: ASPECTS: 8 CBF (<30%) Volume: 0mL Perfusion (Tmax>6.0s) volume: 9mL Mismatch Volume: 9mL Infarction Location:No infarct core; area of decreased perfusion in posterior left MCA territory, which does not correlate with the area of hypodensity on the same-day CT. IMPRESSION: 1. Occlusion of the left M2 insular branch, with reconstitution and intermittent poor perfusion of the more distal left M2. 2. No other intracranial large vessel occlusion or significant stenosis. 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 3. No hemodynamically significant stenosis in the neck. 4. No infarct core; area of decreased perfusion in the posterior left MCA territory, which does not correlate with the area of hypodensity on the same-day CT and is favored to be related to the poor perfusion seen in the more distal left MCA. Aortic Atherosclerosis (ICD10-I70.0) and Emphysema (ICD10-J43.9). Imaging results were communicated on 04/24/2023 at 7:28 pm to provider Dr. Wilford Corner via secure text paging. Electronically Signed: By: Wiliam Ke M.D. On: 04/24/2023 19:35   CT HEAD CODE STROKE WO CONTRAST  Result Date: 04/24/2023  CLINICAL DATA:  Code stroke.  Aphasia EXAM: CT HEAD WITHOUT CONTRAST TECHNIQUE: Contiguous axial images were obtained from the base of the skull through the vertex without intravenous contrast. RADIATION DOSE REDUCTION: This  exam was performed according to the departmental dose-optimization program which includes automated exposure control, adjustment of the mA and/or kV according to patient size and/or use of iterative reconstruction technique. COMPARISON:  None Available. FINDINGS: Brain: Hypodensity in the anterior left temporal lobe (series 2, image 9). Some loss of the left insular ribbon (series 2, image 12). No hemorrhage, mass, mass effect, or midline shift. No hydrocephalus or extra-axial collection. Vascular: S. Mall focus of hyperdensity in the left MCA (series 2, image 10). Skull: Negative for fracture or focal lesion. Sinuses/Orbits: Mucosal thickening in the ethmoid air cells. No acute finding in the orbits. Other: The mastoid air cells are well aerated. ASPECTS [facility] Stroke Program Early CT Score) - Ganglionic level infarction (caudate, lentiform nuclei, internal capsule, insula, M1-M3 cortex): 5 - Supraganglionic infarction (M4-M6 cortex): 3 Total score (0-10 with 10 being normal): 8 . IMPRESSION: 1. Hypodensity in the anterior left temporal lobe, with some loss of the left insular ribbon, concerning for acute infarct. ASPECTS is 8. 2. No intracranial hemorrhage. Imaging results were communicated on 04/24/2023 at 7:23 pm to provider Dr. Wilford Corner via secure text paging. Electronically Signed   By: Wiliam Ke M.D.   On: 04/24/2023 19:09    Vitals:   04/25/23 1610 04/25/23 0645 04/25/23 0700 04/25/23 0715  BP:  (!) 112/57 (!) 107/54 (!) 100/55  Pulse:  67 68 74  Resp:  15 15 (!) 26  Temp: 98.2 F (36.8 C)     TempSrc: Oral     SpO2:  96% 95% 96%  Weight:      Height:       PHYSICAL EXAM General:  Alert, well-nourished, well-developed patient in no acute distress Psych:  Mood and affect appropriate for situation CV: Regular rate and rhythm on monitor Respiratory:  Regular, unlabored respirations on room air GI: Abdomen soft and nontender   NEURO:  Mental Status: AA&Ox3, patient is able to give clear  and coherent history Speech/Language: Speech has dysarthria and aphasia. Naming, repetition, fluency, and comprehension all mildly impaired. Naming more difficult than repetition. Patient able to do simple arithmetic by hand without difficulty, but has trouble with vocalizing.  Cranial Nerves:  II: PERRL. Visual fields full.  III, IV, VI: EOMI. Eyelids elevate symmetrically.  V: Sensation is intact to light touch and symmetrical to face.  VII: Face is symmetrical resting and smiling VIII: hearing intact to voice. IX, X: Palate elevates symmetrically. Phonation is normal.  RU:[id number] shrug 5/5. XII: tongue is midline without fasciculations. Motor: 5/5 strength to all muscle groups tested.  Tone: is normal and bulk is normal Sensation- Intact to light touch bilaterally. Extinction absent to light touch to DSS.   Coordination: FTN intact bilaterally, HKS: no ataxia in BLE.No drift.  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 Short segmental occlusio, etiology likely large vessel disease given multiple risk factors but can not completely rule out cardioembolic source  Code Stroke - CT head Hypodensity in the anterior left temporal lobe, with some loss of the left insular ribbon, concerning for acute infarct. ASPECTS is 8. CTA head & neck 10/14 Occlusion of the left M2 insular branch, with reconstitution and intermittent poor perfusion of the more distal left M2.  CTP  0/9. MRI BRAIN  Acute left MCA territory cortical/subcortical infarct affecting portions of the anterosuperior left temporal lobe and left insula, as described. 2D Echo  Pending  Recommend 30 day cardiac event monitoring as outpt to rule out afib LDL 34 HgbA1c 6.1 VTE prophylaxis - Lovenox 40 mg subcutaneous Q24 No antithrombotic prior to admission, now on aspirin 81 mg daily and clopidogrel 75 mg daily for  3 months and then aspirin  alone. Therapy  recommendations:  No follow up needed  Disposition:  pending  Tobacco abuse Current smoker Smoking cessation counseling provided Nicotine patch provided Pt is willing to quit  Hypertension Home meds:  None Stable Long term BP goal normotensive  Hyperlipidemia Home meds:  ezetimibe 10 mg, resumed in hospital LDL 34, goal < 70 High intensity statin not indicated - Previous intolerance and LDL at goal Continue zetia at discharge  Diabetes type II Well Controlled Home meds:  Metformin 1000 mg BID HgbA1c 6.1, goal < 7.0 CBGs SSI Close PCP follow up  Other Stroke Risk Factors Advanced age  Other Active Problems Leukocytosis WBC 13.7->14.7  Hospital day # 1  Signed: Christie Nottingham, MD [facility] Health Physician 04/25/2023 9:04 AM  ATTENDING NOTE: I reviewed above note and agree with the assessment and plan. Pt was seen and examined.   Husband at the bedside. Pt sitting in bed, still has mild expressive aphasia and intermittent paraphasic errors. Naming 2/4 but able to repeat. Follows all simple commands. And otherwise neuro intact. Given the risk factors her stroke most likely large vessel disease and will need DAPT for 3 months and then ASA alone. However, recommend 30 day monitoring as outpt to rule out afib. Continue zetia and LDL at goal. No statin due to intolerance in the past. PT and OT no recs.   For detailed assessment and plan, please refer to above/below as I have made changes wherever appropriate.   Neurology will sign off. Please call with questions. Pt will follow up with stroke clinic NP at [facility] in about 4 weeks. Thanks for the consult.   Marvel Plan, MD PhD Stroke Neurology 04/25/2023 2:17 PM    To contact Stroke Continuity provider, please refer to [email or web address]. After hours, contact General Neurology

## 2023-04-25 NOTE — Evaluation (Signed)
Occupational Therapy Evaluation Patient Details Name: Autumn Walker MRN: [id number] DOB: 10-Jan-1951 Today's Date: 04/25/2023   History of Present Illness 72 y.o. female presents to [facility] hospital on 04/24/2023 with recurrent aphasia and transient L eye vision changes. CT head demonstrates hypodensity in L insular ribbon and L anterior temporal lobe. PMH includes HTN, HLD, CKD III, COPD.   Clinical Impression   Patient admitted for the diagnosis above.  Patient is essentially at her baseline for in room mobility and ADL completion.  No OT is needed in the acute setting, and no post acute OT is anticipated.         If plan is discharge home, recommend the following: Assist for transportation    Functional Status Assessment  Patient has not had a recent decline in their functional status  Equipment Recommendations  None recommended by OT    Recommendations for Other Services       Precautions / Restrictions Precautions Precautions: Other (comment) Precaution Comments: expressive aphasia Restrictions Weight Bearing Restrictions: No      Mobility Bed Mobility Overal bed mobility: Independent                  Transfers Overall transfer level: Independent                        Balance Overall balance assessment: No apparent balance deficits (not formally assessed)                                         ADL either performed or assessed with clinical judgement   ADL Overall ADL's : At baseline                                             Vision Baseline Vision/History: 1 Wears glasses Patient Visual Report: No change from baseline       Perception Perception: Within Functional Limits       Praxis Praxis: WFL       Pertinent Vitals/Pain Pain Assessment Pain Assessment: No/denies pain     Extremity/Trunk Assessment Upper Extremity Assessment Upper Extremity Assessment: Overall WFL for tasks  assessed   Lower Extremity Assessment Lower Extremity Assessment: Overall WFL for tasks assessed   Cervical / Trunk Assessment Cervical / Trunk Assessment: Normal   Communication Communication Communication: Difficulty communicating thoughts/reduced clarity of speech Cueing Techniques: Verbal cues   Cognition Arousal: Alert Behavior During Therapy: WFL for tasks assessed/performed Overall Cognitive Status: Within Functional Limits for tasks assessed                                       General Comments  VSS on RA    Exercises     Shoulder Instructions      Home Living Family/patient expects to be discharged to:: Private residence Living Arrangements: Spouse/significant other;Children Available Help at Discharge: Family;Available 24 hours/day Type of Home: House Home Access: Stairs to enter [employer] of Steps: 1 Entrance Stairs-Rails: None Home Layout: One level     Bathroom Shower/Tub: Producer, television/film/video: Standard     Home Equipment: Agricultural consultant (2 wheels);Cane - single point;Shower seat  Prior Functioning/Environment Prior Level of Function : Independent/Modified Independent                        OT Problem List: Other (comment)      OT Treatment/Interventions:      OT Goals(Current goals can be found in the care plan section) Acute Rehab OT Goals Patient Stated Goal: Return home OT Goal Formulation: With patient Time For Goal Achievement: 04/28/23 Potential to Achieve Goals: Good  OT Frequency:      Co-evaluation              AM-PAC OT "6 Clicks" Daily Activity     Outcome Measure Help from another person eating meals?: None Help from another person taking care of personal grooming?: None Help from another person toileting, which includes using toliet, bedpan, or urinal?: None Help from another person bathing (including washing, rinsing, drying)?: None Help from another  person to put on and taking off regular upper body clothing?: None Help from another person to put on and taking off regular lower body clothing?: None 6 Click Score: 24   End of Session Nurse Communication: Mobility status  Activity Tolerance: Patient tolerated treatment well Patient left: in bed;with call bell/phone within reach;with family/visitor present  OT Visit Diagnosis: Cognitive communication deficit (R41.841) Symptoms and signs involving cognitive functions: Cerebral infarction                Time: 1341-1353 OT Time Calculation (min): 12 min Charges:  OT General Charges $OT Visit: 1 Visit OT Evaluation $OT Eval Moderate Complexity: 1 Mod  04/25/2023  RP, OTR/L  Acute Rehabilitation Services  Office:  [phone]   Suzanna Obey 04/25/2023, 1:56 PM

## 2023-04-25 NOTE — ED Notes (Signed)
ED TO INPATIENT HANDOFF REPORT  ED Nurse Name and Phone #:  Theophilus Bones [phone]  S Name/Age/Gender Autumn Walker 72 y.o. female Room/Bed: 040C/040C  Code Status   Code Status: Full Code  Home/SNF/Other Home Patient oriented to: self, place, time, and situation Is this baseline? 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]  Triage Note No notes on file   Allergies Allergies  Allergen Reactions   Statins     Severe leg cramps   Codeine Nausea Only    Level of Care/Admitting Diagnosis ED Disposition     ED Disposition  Admit   Condition  --   Comment  Hospital Area: [facility] [[id number]]  Level of Care: Telemetry Medical [104]  May admit patient to [facility] or [facility] if equivalent level of care is available:: No  Covid Evaluation: Asymptomatic - no recent exposure (last 10 days) testing not required  Diagnosis: Acute ischemic left MCA stroke [facility]) [[id number]]  Admitting Physician: Dolly Rias [[id number]]  Attending Physician: Dolly Rias [[id number]]  Certification:: I certify this patient will need inpatient services for at least 2 midnights  Expected Medical Readiness: 04/26/2023          B Medical/Surgery History Past Medical History:  Diagnosis Date   Allergy    Blood transfusion without reported diagnosis    Cataract    Diabetes mellitus without complication (HCC)    Past Surgical History:  Procedure Laterality Date   APPENDECTOMY     CHOLECYSTECTOMY     PANCREATECTOMY     SPLENECTOMY, TOTAL       A IV Location/Drains/Wounds Patient Lines/Drains/Airways Status     Active Line/Drains/Airways     Name Placement date Placement time Site Days   Peripheral IV 04/24/23 18 G Left Forearm 04/24/23  1928  Forearm  1   Peripheral IV 04/24/23 18 G Right Forearm 04/24/23  1929  Forearm  1            Intake/Output Last 24 hours  Intake/Output  Summary (Last 24 hours) at 04/25/2023 0813 Last data filed at 04/24/2023 2204 Gross per 24 hour  Intake 1000 ml  Output --  Net 1000 ml    Labs/Imaging Results for orders placed or performed during the hospital encounter of 04/24/23 (from the past 48 hour(s))  CBG monitoring, ED     Status: Abnormal   Collection Time: 04/24/23  6:47 PM  Result Value Ref Range   Glucose-Capillary 102 (H) 70 - 99 mg/dL    Comment: Glucose reference range applies only to samples taken after fasting for at least 8 hours.  Protime-INR     Status: None   Collection Time: 04/24/23  6:48 PM  Result Value Ref Range   Prothrombin Time 12.5 11.4 - 15.2 seconds   INR 0.9 0.8 - 1.2    Comment: (NOTE) INR goal varies based on device and disease states. Performed at [facility], [address] [place], [place] [address]   APTT     Status: None   Collection Time: 04/24/23  6:48 PM  Result Value Ref Range   aPTT 28 24 - 36 seconds    Comment: Performed at [facility], [address], [place], [place] [address]  CBC     Status: Abnormal   Collection Time: 04/24/23  6:48 PM  Result Value Ref Range   WBC 13.7 (H) 4.0 - 10.5 K/uL  RBC 4.01 3.87 - 5.11 MIL/uL   Hemoglobin 13.0 12.0 - 15.0 g/dL   HCT 29.5 62.1 - 30.8 %   MCV 100.2 (H) 80.0 - 100.0 fL   MCH 32.4 26.0 - 34.0 pg   MCHC 32.3 30.0 - 36.0 g/dL   RDW 65.7 84.6 - 96.2 %   Platelets 299 150 - 400 K/uL   nRBC 0.0 0.0 - 0.2 %    Comment: Performed at [facility], [address] [place], [place] [address]  Differential     Status: Abnormal   Collection Time: 04/24/23  6:48 PM  Result Value Ref Range   Neutrophils Relative % 55 %   Neutro Abs 7.6 1.7 - 7.7 K/uL   Lymphocytes Relative 35 %   Lymphs Abs 4.7 (H) 0.7 - 4.0 K/uL   Monocytes Relative 8 %   Monocytes Absolute 1.1 (H) 0.1 - 1.0 K/uL   Eosinophils Relative 1 %   Eosinophils Absolute 0.1 0.0 - 0.5 K/uL   Basophils Relative 1 %   Basophils Absolute 0.1 0.0 - 0.1 K/uL    Immature Granulocytes 0 %   Abs Immature Granulocytes 0.05 0.00 - 0.07 K/uL    Comment: Performed at [facility], [address] [place], [place] [address]  Comprehensive metabolic panel     Status: Abnormal   Collection Time: 04/24/23  6:48 PM  Result Value Ref Range   Sodium 135 135 - 145 mmol/L   Potassium 3.9 3.5 - 5.1 mmol/L   Chloride 102 98 - 111 mmol/L   CO2 21 (L) 22 - 32 mmol/L   Glucose, Bld 103 (H) 70 - 99 mg/dL    Comment: Glucose reference range applies only to samples taken after fasting for at least 8 hours.   BUN 12 8 - 23 mg/dL   Creatinine, Ser 0.10 0.44 - 1.00 mg/dL   Calcium 9.4 8.9 - 27.2 mg/dL   Total Protein 7.2 6.5 - 8.1 g/dL   Albumin 4.1 3.5 - 5.0 g/dL   AST 21 15 - 41 U/L   ALT 14 0 - 44 U/L   Alkaline Phosphatase 37 (L) 38 - 126 U/L   Total Bilirubin 0.6 0.3 - 1.2 mg/dL   GFR, Estimated >53 >66 mL/min    Comment: (NOTE) Calculated using the CKD-EPI Creatinine Equation (2021)    Anion gap 12 5 - 15    Comment: Performed at [facility], [address] [place], [place] [address]  Ethanol     Status: None   Collection Time: 04/24/23  6:48 PM  Result Value Ref Range   Alcohol, Ethyl (B) <10 <10 mg/dL    Comment: (NOTE) Lowest detectable limit for serum alcohol is 10 mg/dL.  For medical purposes only. Performed at [facility], [address], [place], [place] [address]   I-stat chem 8, ED     Status: Abnormal   Collection Time: 04/24/23  6:56 PM  Result Value Ref Range   Sodium 135 135 - 145 mmol/L   Potassium 3.9 3.5 - 5.1 mmol/L   Chloride 103 98 - 111 mmol/L   BUN 13 8 - 23 mg/dL   Creatinine, Ser 5.63 0.44 - 1.00 mg/dL   Glucose, Bld 875 (H) 70 - 99 mg/dL    Comment: Glucose reference range applies only to samples taken after fasting for at least 8 hours.   Calcium, Ion 1.14 (L) 1.15 - 1.40 mmol/L   TCO2 20 (L) 22 -  32 mmol/L   Hemoglobin 13.6 12.0 - 15.0 g/dL   HCT 16.1 09.6 - 04.5 %  CBG monitoring, ED      Status: None   Collection Time: 04/24/23  9:54 PM  Result Value Ref Range   Glucose-Capillary 87 70 - 99 mg/dL    Comment: Glucose reference range applies only to samples taken after fasting for at least 8 hours.  Basic metabolic panel     Status: Abnormal   Collection Time: 04/25/23  4:05 AM  Result Value Ref Range   Sodium 135 135 - 145 mmol/L   Potassium 3.7 3.5 - 5.1 mmol/L   Chloride 104 98 - 111 mmol/L   CO2 21 (L) 22 - 32 mmol/L   Glucose, Bld 103 (H) 70 - 99 mg/dL    Comment: Glucose reference range applies only to samples taken after fasting for at least 8 hours.   BUN 10 8 - 23 mg/dL   Creatinine, Ser 4.09 0.44 - 1.00 mg/dL   Calcium 8.7 (L) 8.9 - 10.3 mg/dL   GFR, Estimated >81 >19 mL/min    Comment: (NOTE) Calculated using the CKD-EPI Creatinine Equation (2021)    Anion gap 10 5 - 15    Comment: Performed at [facility], [address] [place], [place] [address]  CBC     Status: Abnormal   Collection Time: 04/25/23  4:05 AM  Result Value Ref Range   WBC 14.7 (H) 4.0 - 10.5 K/uL   RBC 3.66 (L) 3.87 - 5.11 MIL/uL   Hemoglobin 11.8 (L) 12.0 - 15.0 g/dL   HCT 95.6 21.3 - 08.6 %   MCV 98.9 80.0 - 100.0 fL   MCH 32.2 26.0 - 34.0 pg   MCHC 32.6 30.0 - 36.0 g/dL   RDW 57.8 46.9 - 62.9 %   Platelets 274 150 - 400 K/uL   nRBC 0.0 0.0 - 0.2 %    Comment: Performed at [facility], [address], [place], [place] [address]  Magnesium     Status: None   Collection Time: 04/25/23  4:05 AM  Result Value Ref Range   Magnesium 1.7 1.7 - 2.4 mg/dL    Comment: Performed at [facility], [address] [place], [place] [address]  Phosphorus     Status: None   Collection Time: 04/25/23  4:05 AM  Result Value Ref Range   Phosphorus 3.1 2.5 - 4.6 mg/dL    Comment: Performed at [facility], [address], [place], [place] [address]  Lipid panel     Status: Abnormal   Collection Time: 04/25/23  4:05 AM  Result Value Ref Range   Cholesterol 99 0 - 200  mg/dL   Triglycerides 536 <644 mg/dL   HDL 40 (L) >03 mg/dL   Total CHOL/HDL Ratio 2.5 RATIO   VLDL 25 0 - 40 mg/dL   LDL Cholesterol 34 0 - 99 mg/dL    Comment:        Total Cholesterol/HDL:CHD Risk Coronary Heart Disease Risk Table                     Men   Women  1/2 Average Risk   3.4   3.3  Average Risk       5.0   4.4  2 X Average Risk   9.6   7.1  3 X Average Risk  23.4   11.0  Use the calculated Patient Ratio above and the CHD Risk Table to determine the patient's CHD Risk.        ATP III CLASSIFICATION (LDL):  <100     mg/dL   Optimal  696-295  mg/dL   Near or Above                    Optimal  130-159  mg/dL   Borderline  284-132  mg/dL   High  >440     mg/dL   Very High Performed at [facility], [address], [place], [place] [address]   Hemoglobin A1c     Status: Abnormal   Collection Time: 04/25/23  4:05 AM  Result Value Ref Range   Hgb A1c MFr Bld 6.1 (H) 4.8 - 5.6 %    Comment: (NOTE) Pre diabetes:          5.7%-6.4%  Diabetes:              >6.4%  Glycemic control for   <7.0% adults with diabetes    Mean Plasma Glucose 128.37 mg/dL    Comment: Performed at [facility], [address] [place], [place] [address]  CBG monitoring, ED     Status: None   Collection Time: 04/25/23  7:33 AM  Result Value Ref Range   Glucose-Capillary 78 70 - 99 mg/dL    Comment: Glucose reference range applies only to samples taken after fasting for at least 8 hours.   Comment 1 Document in Chart   CBG monitoring, ED     Status: None   Collection Time: 04/25/23  7:43 AM  Result Value Ref Range   Glucose-Capillary 85 70 - 99 mg/dL    Comment: Glucose reference range applies only to samples taken after fasting for at least 8 hours.   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 Imaging results were communicated on 04/24/2023 at 7:45 pm to provider Dr. Wilford Corner via secure text paging. Electronically Signed   By: Wiliam Ke M.D.   On: 04/24/2023 19:46   Result Date: 04/24/2023 CLINICAL DATA:  Aphasia, stroke suspected EXAM: CT ANGIOGRAPHY HEAD AND NECK WITH AND WITHOUT CONTRAST CT PERFUSION TECHNIQUE: Multidetector CT imaging of the head and neck was performed using the standard protocol during bolus administration of intravenous contrast. Multiplanar CT image reconstructions and MIPs were obtained to evaluate the vascular anatomy. Carotid stenosis measurements (when applicable) are obtained utilizing NASCET criteria, using the distal internal carotid diameter as the denominator. Multiphase CT imaging of the brain was performed following IV bolus contrast injection. Subsequent parametric perfusion maps were calculated using RAPID software. RADIATION DOSE REDUCTION: This exam was performed according to the departmental dose-optimization program which includes automated exposure control, adjustment of the mA and/or kV according to patient size and/or use of iterative reconstruction technique. CONTRAST:  75 mL Omnipaque 350 COMPARISON:  No prior CTA available. FINDINGS: CT HEAD FINDINGS For noncontrast findings, please see same day CT head. CTA NECK FINDINGS Aortic arch: Standard branching. Imaged portion shows no evidence of aneurysm or dissection. No significant stenosis of the major arch vessel origins. Aortic atherosclerosis. Right carotid system: No evidence of dissection, occlusion, or hemodynamically significant stenosis (greater than 50%). Left carotid  system: No evidence of dissection, occlusion, or hemodynamically significant stenosis (greater than 50%). Vertebral arteries: No evidence of dissection, occlusion, or hemodynamically significant stenosis (greater than 50%). Skeleton: No acute osseous abnormality.  Degenerative changes in the cervical spine. Other neck: No acute finding. Upper chest: Centrilobular nodules, likely smoking related lung disease. Emphysematous changes. No focal pulmonary opacity or pleural effusion. Review of the MIP images confirms the above findings CTA HEAD FINDINGS Anterior circulation: Both internal carotid arteries are patent to the termini, with moderate stenosis in the right supraclinoid ICA. A1 segments patent, hypoplastic on the right. Normal anterior communicating artery. Anterior cerebral arteries are patent to their distal aspects without significant stenosis. No M1 stenosis or occlusion. 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 Right MCA branches perfused to their distal aspects without significant stenosis. Posterior circulation: Vertebral arteries patent to the vertebrobasilar junction without significant stenosis. Posterior inferior cerebellar arteries patent proximally. Basilar patent to its distal aspect without significant stenosis. Superior cerebellar arteries patent proximally. Patent left P1. Fetal origin of the right PCA from the right posterior communicating artery. PCAs perfused to their distal aspects without significant stenosis. Venous sinuses: As permitted by contrast timing, patent. Anatomic variants: Fetal origin of the right PCA. No evidence of aneurysm or vascular malformation. Review of the MIP images confirms the above findings CT Brain Perfusion Findings: ASPECTS: 8 CBF (<30%) Volume: 0mL Perfusion (Tmax>6.0s) volume: 9mL Mismatch Volume: 9mL Infarction Location:No infarct core; area of decreased perfusion in posterior left MCA territory, which does not correlate with the area of hypodensity on the same-day CT. IMPRESSION: 1. 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 2. No other intracranial large vessel occlusion or significant stenosis. 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 3. No hemodynamically significant stenosis in the neck. 4. No infarct core; area of decreased perfusion in the posterior left MCA territory, which does not correlate with the area of hypodensity on the same-day CT and is favored to be related to the poor perfusion seen in the more distal left MCA. Aortic Atherosclerosis (ICD10-I70.0) and Emphysema (ICD10-J43.9). Imaging results were communicated on 04/24/2023 at 7:28 pm to provider Dr. Wilford Corner via secure text paging. Electronically Signed: By: Wiliam Ke M.D. On: 04/24/2023 19:35   CT HEAD CODE STROKE WO CONTRAST  Result Date: 04/24/2023 CLINICAL DATA:  Code stroke.  Aphasia EXAM: CT HEAD WITHOUT CONTRAST TECHNIQUE: Contiguous axial images were obtained from the base of the skull through the vertex without intravenous contrast. RADIATION DOSE REDUCTION: This exam was performed according to the departmental dose-optimization program which includes automated exposure control, adjustment of the mA and/or kV according to patient size and/or use of iterative reconstruction technique. COMPARISON:  None Available. FINDINGS: Brain: Hypodensity in the anterior left temporal lobe (series 2, image 9). Some loss of the left insular ribbon (series 2, image 12). No hemorrhage, mass, mass effect, or midline shift. No hydrocephalus or extra-axial collection. Vascular: S. Mall focus of hyperdensity in the left MCA (series 2, image 10). Skull: Negative for fracture or focal lesion. Sinuses/Orbits: Mucosal thickening in the ethmoid air cells. No acute finding in the orbits. Other: The mastoid air cells are well aerated. ASPECTS [facility] Stroke Program Early CT Score) -  Ganglionic level infarction (caudate, lentiform nuclei, internal capsule, insula,  M1-M3 cortex): 5 - Supraganglionic infarction (M4-M6 cortex): 3 Total score (0-10 with 10 being normal): 8 . IMPRESSION: 1. Hypodensity in the anterior left temporal lobe, with some loss of the left insular ribbon, concerning for acute infarct. ASPECTS is 8. 2. No intracranial hemorrhage. Imaging results were communicated on 04/24/2023 at 7:23 pm to provider Dr. Wilford Corner via secure text paging. Electronically Signed   By: Wiliam Ke M.D.   On: 04/24/2023 19:09    Pending Labs Unresulted Labs (From admission, onward)    None       Vitals/Pain Today's Vitals   04/25/23 0637 04/25/23 0645 04/25/23 0700 04/25/23 0715  BP:  (!) 112/57 (!) 107/54 (!) 100/55  Pulse:  67 68 74  Resp:  15 15 (!) 26  Temp: 98.2 F (36.8 C)     TempSrc: Oral     SpO2:  96% 95% 96%  Weight:      Height:      PainSc:        Isolation Precautions No active isolations  Medications Medications  enoxaparin (LOVENOX) injection 40 mg (40 mg Subcutaneous Given 04/24/23 2202)   stroke: early stages of recovery book (has no administration in time range)  acetaminophen (TYLENOL) tablet 1,000 mg (1,000 mg Oral Given 04/24/23 2202)  nicotine polacrilex (NICORETTE) gum 2 mg (has no administration in time range)  aspirin chewable tablet 81 mg (has no administration in time range)  rosuvastatin (CRESTOR) tablet 40 mg (40 mg Oral Not Given 04/24/23 2203)  ezetimibe (ZETIA) tablet 10 mg (has no administration in time range)  gabapentin (NEURONTIN) capsule 600 mg (600 mg Oral Given 04/24/23 2200)  albuterol (PROVENTIL) (2.5 MG/3ML) 0.083% nebulizer solution 3 mL (has no administration in time range)  montelukast (SINGULAIR) tablet 10 mg (10 mg Oral Given 04/24/23 2334)  insulin aspart (novoLOG) injection 0-6 Units ( Subcutaneous Not Given 04/25/23 0734)  clopidogrel (PLAVIX) tablet 75 mg (has no administration in time range)  sodium chloride flush (NS) 0.9 % injection 3 mL (3 mLs Intravenous Given 04/24/23 1928)  iohexol  (OMNIPAQUE) 350 MG/ML injection 75 mL (75 mLs Intravenous Contrast Given 04/24/23 1934)  sodium chloride 0.9 % bolus 1,000 mL (0 mLs Intravenous Stopped 04/24/23 2204)  aspirin chewable tablet 324 mg (324 mg Oral Given 04/24/23 2200)    Mobility walks     Focused Assessments Neuro Assessment Handoff:  Swallow screen pass? Yes  Cardiac Rhythm: Normal sinus rhythm NIH Stroke Scale  Dizziness Present: No Headache Present: No Interval: Other (Comment) (2 hour) Level of Consciousness (1a.)   : Alert, keenly responsive LOC Questions (1b. )   : Answers both questions correctly LOC Commands (1c. )   : Performs both tasks correctly Best Gaze (2. )  : Normal Visual (3. )  : No visual loss Facial Palsy (4. )    : Normal symmetrical movements Motor Arm, Left (5a. )   : No drift Motor Arm, Right (5b. ) : No drift Motor Leg, Left (6a. )  : No drift Motor Leg, Right (6b. ) : No drift Limb Ataxia (7. ): Absent Sensory (8. )  : Normal, no sensory loss Best Language (9. )  : No aphasia Dysarthria (10. ): Normal Extinction/Inattention (11.)   : No Abnormality Complete NIHSS TOTAL: 0 Last date known well: 04/23/23 Last time known well:  (unknown time) Neuro Assessment: Within Defined Limits Neuro Checks:   Initial (04/24/23 1844)  Has TPA  been given? No If patient is a Neuro Trauma and patient is going to OR before floor call report to 4N Charge nurse: [phone] or [phone]   R Recommendations: See Admitting Provider Note  Report given to:   Additional Notes:

## 2023-04-25 NOTE — Progress Notes (Signed)
PROGRESS NOTE    Autumn Walker  EXB:[id number] DOB: [birth year] DOA: 04/24/2023 PCP: [name], PA  Brief Narrative:  This 72 yrs old female with PMH significant of active tobacco use , hypertension, hyperlipidemia, CKD 3a, COPD, presented in the ED with C/O : recurrent aphasia.  She was watching movie at 4 pm yesterday and suddenly was unable to comprehend speech.  She also reports expressive aphasia, says she was unable to tell her husband what groceries she wanted from the store.  Her symptoms continued until approximately 10 PM when suddenly they abated. She felt mostly back to her normal and could comprehend and express herself better. This morning remained near her baseline, but she again developed similar symptoms of expressive aphasia and came here in ED.   CT head showed hypodensity in the left insular ribbon and left anterior temporal lobe.  Patient is admitted for further evaluation.  Assessment & Plan:   Principal Problem:   Acute ischemic left MCA stroke (HCC)   Acute ischemic stroke: Left M2 insular branch occlusion Patient presented with expressive aphasia, visual disturbance. CT head showed hypodensity in the left insular ribbon and left anterior temporal lobe. Last known well time 10/13/4 p.m. Transient symptom improvements after few hours. CT perfusion with area of hypoperfusion in the distal left MCA territory 9 cc penumbra.   She was out of the window for TNK and thrombectomy.  Etiology of stroke is large vessel occlusion suspected cardioembolic.  Neurology consult appreciated. Continue aspirin, Lipitor. Allow permissive hypertension <220/120  LDL 34, Hb A1c 6.1 well-controlled Serial neurochecks. Continue telemetry. Obtain TTE with bubble PT/OT/SLP evaluation. Swallow screen then [employer] diet  Advised on smoking cessation    Tobacco use: Current smoker at 1 quart a pack a day Nicotine gum 2 mg prn cravings, ready to quit. Counseled on cessation    Hypertension:  Holding home losartan for permissive hypertension.  Hyperlipidemia:  Continue Lipitor 40 mg daily  COPD:  Continue home albuterol prn, montelukast.  Smoking cessation above.   DVT prophylaxis: Lovenox Code Status:Full code Family Communication: Husband at bed side. Disposition Plan:     Status is: Inpatient Remains inpatient appropriate because: Admitted for CVA workup.    Consultants:  Neurology  Procedures: CT head  Antimicrobials: None  Subjective: Patient was seen and examined at bedside.  Overnight events noted.   Patient was sitting on the chair,  states she is feeling better and back to her baseline status.  Objective: Vitals:   04/25/23 0645 04/25/23 0700 04/25/23 0715 04/25/23 0730  BP: (!) 112/57 (!) 107/54 (!) 100/55 (!) 114/58  Pulse: 67 68 74 83  Resp: 15 15 (!) 26 (!) 27  Temp:      TempSrc:      SpO2: 96% 95% 96% 95%  Weight:      Height:        Intake/Output Summary (Last 24 hours) at 04/25/2023 1132 Last data filed at 04/24/2023 2204 Gross per 24 hour  Intake 1000 ml  Output --  Net 1000 ml   Filed Weights   04/24/23 1852 04/24/23 1902 04/24/23 2231  Weight: 60.2 kg 60.2 kg 55.8 kg    Examination:  General exam: Appears calm and comfortable, not in any acute distress. Respiratory system: CTA Bilaterally. Respiratory effort normal.  RR 15 Cardiovascular system: S1 & S2 heard, RRR. No murmur, rubs, gallop,  No pedal edema. Gastrointestinal system: Abdomen is non distended, soft and non tender.  Normal bowel sounds heard. Central  nervous system: Alert and oriented X 3. No focal neurological deficits. Extremities: No edema, no cyanosis, no clubbing. Skin: No rashes, lesions or ulcers Psychiatry: Judgement and insight appear normal. Mood & affect appropriate.     Data Reviewed: I have personally reviewed following labs and imaging studies  CBC: Recent Labs  Lab 04/24/23 1848 04/24/23 1856 04/25/23 0405  WBC  13.7*  --  14.7*  NEUTROABS 7.6  --   --   HGB 13.0 13.6 11.8*  HCT 40.2 40.0 36.2  MCV 100.2*  --  98.9  PLT 299  --  274   Basic Metabolic Panel: Recent Labs  Lab 04/24/23 1848 04/24/23 1856 04/25/23 0405  NA 135 135 135  K 3.9 3.9 3.7  CL 102 103 104  CO2 21*  --  21*  GLUCOSE 103* 102* 103*  BUN 12 13 10   CREATININE 0.94 0.90 0.97  CALCIUM 9.4  --  8.7*  MG  --   --  1.7  PHOS  --   --  3.1   GFR: Estimated Creatinine Clearance: 42.1 mL/min (by C-G formula based on SCr of 0.97 mg/dL). Liver Function Tests: Recent Labs  Lab 04/24/23 1848  AST 21  ALT 14  ALKPHOS 37*  BILITOT 0.6  PROT 7.2  ALBUMIN 4.1   No results for input(s): "LIPASE", "AMYLASE" in the last 168 hours. No results for input(s): "AMMONIA" in the last 168 hours. Coagulation Profile: Recent Labs  Lab 04/24/23 1848  INR 0.9   Cardiac Enzymes: No results for input(s): "CKTOTAL", "CKMB", "CKMBINDEX", "TROPONINI" in the last 168 hours. BNP (last 3 results) No results for input(s): "PROBNP" in the last 8760 hours. HbA1C: Recent Labs    04/25/23 0405  HGBA1C 6.1*   CBG: Recent Labs  Lab 04/24/23 1847 04/24/23 2154 04/25/23 0733 04/25/23 0743  GLUCAP 102* 87 78 85   Lipid Profile: Recent Labs    04/25/23 0405  CHOL 99  HDL 40*  LDLCALC 34  TRIG 161  CHOLHDL 2.5   Thyroid Function Tests: No results for input(s): "TSH", "T4TOTAL", "FREET4", "T3FREE", "THYROIDAB" in the last 72 hours. Anemia Panel: No results for input(s): "VITAMINB12", "FOLATE", "FERRITIN", "TIBC", "IRON", "RETICCTPCT" in the last 72 hours. Sepsis Labs: No results for input(s): "PROCALCITON", "LATICACIDVEN" in the last 168 hours.  No results found for this or any previous visit (from the past 240 hour(s)).   Radiology Studies: MR BRAIN WO CONTRAST  Result Date: 04/25/2023 CLINICAL DATA:  Stroke, follow-up. EXAM: MRI HEAD WITHOUT CONTRAST TECHNIQUE: Multiplanar, multiecho pulse sequences of the brain and  surrounding structures were obtained without intravenous contrast. COMPARISON:  Non-contrast head CT and CT angiogram head/neck 04/24/2023. FINDINGS: Brain: No age advanced or lobar predominant parenchymal atrophy. Acute left MCA territory cortical/subcortical infarct affecting portions of the anterosuperior left temporal lobe and left insula, spanning 3.2 cm. Parenchymal swelling at this site. No midline shift or evidence of hemorrhagic conversion. Minimal multifocal T2 FLAIR hyperintense signal abnormality elsewhere within the cerebral white matter, nonspecific but compatible with chronic small vessel ischemic disease. No evidence of an intracranial mass. No extra-axial fluid collection. Vascular: Occlusion of an M2 left middle cerebral artery vessel was demonstrated on the CTA head/neck of 04/24/2023. Flow voids preserved elsewhere within the proximal large arterial vessels. Skull and upper cervical spine: No focal suspicious marrow lesion. Sinuses/Orbits: No mass or acute finding within the imaged orbits. Mild mucosal thickening within the bilateral ethmoid sinuses. Trace mucosal thickening within the bilateral frontal and maxillary sinuses.  Other: Trace fluid within the bilateral mastoid air cells. IMPRESSION: 1. Acute left MCA territory cortical/subcortical infarct affecting portions of the anterosuperior left temporal lobe and left insula, as described. 2. Minimal background cerebral white matter chronic small vessel ischemic disease. 3. Mild paranasal sinus mucosal thickening. Electronically Signed   By: Jackey Loge D.O.   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 Imaging results were communicated on 04/24/2023 at 7:45 pm to provider Dr.  Wilford Corner via secure text paging. Electronically Signed   By: Wiliam Ke M.D.   On: 04/24/2023 19:46   Result Date: 04/24/2023 CLINICAL DATA:  Aphasia, stroke suspected EXAM: CT ANGIOGRAPHY HEAD AND NECK WITH AND WITHOUT CONTRAST CT PERFUSION TECHNIQUE: Multidetector CT imaging of the head and neck was performed using the standard protocol during bolus administration of intravenous contrast. Multiplanar CT image reconstructions and MIPs were obtained to evaluate the vascular anatomy. Carotid stenosis measurements (when applicable) are obtained utilizing NASCET criteria, using the distal internal carotid diameter as the denominator. Multiphase CT imaging of the brain was performed following IV bolus contrast injection. Subsequent parametric perfusion maps were calculated using RAPID software. RADIATION DOSE REDUCTION: This exam was performed according to the departmental dose-optimization program which includes automated exposure control, adjustment of the mA and/or kV according to patient size and/or use of iterative reconstruction technique. CONTRAST:  75 mL Omnipaque 350 COMPARISON:  No prior CTA available. FINDINGS: CT HEAD FINDINGS For noncontrast findings, please see same day CT head. CTA NECK FINDINGS Aortic arch: Standard branching. Imaged portion shows no evidence of aneurysm or dissection. No significant stenosis of the major arch vessel origins. Aortic atherosclerosis. Right carotid system: No evidence of dissection, occlusion, or hemodynamically significant stenosis (greater than 50%). Left carotid system: No evidence of dissection, occlusion, or hemodynamically significant stenosis (greater than 50%). Vertebral arteries: No evidence of dissection, occlusion, or hemodynamically significant stenosis (greater than 50%). Skeleton: No acute osseous abnormality. Degenerative changes in the cervical spine. Other neck: No acute finding. Upper chest: Centrilobular nodules, likely smoking related lung disease.  Emphysematous changes. No focal pulmonary opacity or pleural effusion. Review of the MIP images confirms the above findings CTA HEAD FINDINGS Anterior circulation: Both internal carotid arteries are patent to the termini, with moderate stenosis in the right supraclinoid ICA. A1 segments patent, hypoplastic on the right. Normal anterior communicating artery. Anterior cerebral arteries are patent to their distal aspects without significant stenosis. No M1 stenosis or occlusion. 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 Right MCA branches perfused to their distal aspects without significant stenosis. Posterior circulation: Vertebral arteries patent to the vertebrobasilar junction without significant stenosis. Posterior inferior cerebellar arteries patent proximally. Basilar patent to its distal aspect without significant stenosis. Superior cerebellar arteries patent proximally. Patent left P1. Fetal origin of the right PCA from the right posterior communicating artery. PCAs perfused to their distal aspects without significant stenosis. Venous sinuses: As permitted by contrast timing, patent. Anatomic variants: Fetal origin of the right PCA. No evidence of aneurysm or vascular  malformation. Review of the MIP images confirms the above findings CT Brain Perfusion Findings: ASPECTS: 8 CBF (<30%) Volume: 0mL Perfusion (Tmax>6.0s) volume: 9mL Mismatch Volume: 9mL Infarction Location:No infarct core; area of decreased perfusion in posterior left MCA territory, which does not correlate with the area of hypodensity on the same-day CT. IMPRESSION: 1. Occlusion of the left M2 insular branch, with reconstitution and intermittent poor perfusion of the more distal left M2. 2. No other intracranial large vessel  occlusion or significant stenosis. 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 3. No hemodynamically significant stenosis in the neck. 4. No infarct core; area of decreased perfusion in the posterior left MCA territory, which does not correlate with the area of hypodensity on the same-day CT and is favored to be related to the poor perfusion seen in the more distal left MCA. Aortic Atherosclerosis (ICD10-I70.0) and Emphysema (ICD10-J43.9). Imaging results were communicated on 04/24/2023 at 7:28 pm to provider Dr. Wilford Corner via secure text paging. Electronically Signed: By: Wiliam Ke M.D. On: 04/24/2023 19:35   CT HEAD CODE STROKE WO CONTRAST  Result Date: 04/24/2023 CLINICAL DATA:  Code stroke.  Aphasia EXAM: CT HEAD WITHOUT CONTRAST TECHNIQUE: Contiguous axial images were obtained from the base of the skull through the vertex without intravenous contrast. RADIATION DOSE REDUCTION: This exam was performed according to the departmental dose-optimization program which includes automated exposure control, adjustment of the mA and/or kV according to patient size and/or use of iterative reconstruction technique. COMPARISON:  None Available. FINDINGS: Brain: Hypodensity in the anterior left temporal lobe (series 2, image 9). Some loss of the left insular ribbon (series 2, image 12). No hemorrhage, mass, mass effect, or midline shift. No hydrocephalus or extra-axial collection. Vascular: S. Mall focus of hyperdensity in the left MCA (series 2, image 10). Skull: Negative for fracture or focal lesion. Sinuses/Orbits: Mucosal thickening in the ethmoid air cells. No acute finding in the orbits. Other: The mastoid air cells are well aerated. ASPECTS [facility] Stroke Program Early CT Score) - Ganglionic level infarction (caudate, lentiform nuclei, internal capsule, insula, M1-M3 cortex): 5 - Supraganglionic infarction (M4-M6 cortex): 3 Total score (0-10 with 10 being normal): 8 . IMPRESSION: 1. Hypodensity in the  anterior left temporal lobe, with some loss of the left insular ribbon, concerning for acute infarct. ASPECTS is 8. 2. No intracranial hemorrhage. Imaging results were communicated on 04/24/2023 at 7:23 pm to provider Dr. Wilford Corner via secure text paging. Electronically Signed   By: Wiliam Ke M.D.   On: 04/24/2023 19:09    Scheduled Meds:  aspirin  81 mg Oral Daily   clopidogrel  75 mg Oral Daily   enoxaparin (LOVENOX) injection  40 mg Subcutaneous Q24H   ezetimibe  10 mg Oral QPC supper   gabapentin  600 mg Oral QHS   insulin aspart  0-6 Units Subcutaneous TID WC   [START ON 04/26/2023] pneumococcal 20-valent conjugate vaccine  0.5 mL Intramuscular Tomorrow-1000   rosuvastatin  40 mg Oral Daily   Continuous Infusions:   LOS: 1 day    Time spent: 50 mins    Willeen Niece, MD [facility]   If 7PM-7AM, please contact night-coverage

## 2023-04-25 NOTE — Evaluation (Signed)
Physical Therapy Evaluation Patient Details Name: Autumn Walker MRN: [id number] DOB: 01-20-1951 Today's Date: 04/25/2023  History of Present Illness  72 y.o. female presents to [facility] hospital on 04/24/2023 with recurrent aphasia and transient L eye vision changes. CT head demonstrates hypodensity in L insular ribbon and L anterior temporal lobe. PMH includes HTN, HLD, CKD III, COPD.  Clinical Impression  Pt presents to PT with deficits in expressive communication, however no significant deficits in mobility at this time. PT notes no focal strength or sensation deficits. Pt is able to mobilize independently and tolerates dynamic gait challenges well. PT recommends discharge home when medically appropriate, no post-acute PT or DME needs.        If plan is discharge home, recommend the following:     Can travel by private vehicle        Equipment Recommendations None recommended by PT  Recommendations for Other Services       Functional Status Assessment Patient has not had a recent decline in their functional status     Precautions / Restrictions Precautions Precautions: Other (comment) Precaution Comments: expressive aphasia Restrictions Weight Bearing Restrictions: No      Mobility  Bed Mobility Overal bed mobility: Independent                  Transfers Overall transfer level: Independent                      Ambulation/Gait Ambulation/Gait assistance: Independent Gait Distance (Feet): 600 Feet Assistive device: None Gait Pattern/deviations: WFL(Within Functional Limits) Gait velocity: functional Gait velocity interpretation: >2.62 ft/sec, indicative of community ambulatory   General Gait Details: stead step-through gait, tolerates backward walking, changes in gait speed and direction, changes in stride length all without considerable balance deviation  Stairs            Wheelchair Mobility     Tilt Bed    Modified Rankin  (Stroke Patients Only) Modified Rankin (Stroke Patients Only) Pre-Morbid Rankin Score: No symptoms Modified Rankin: No significant disability     Balance Overall balance assessment: Independent (pt is able to maintain tandem stance eyes closed for 30+ seconds, retrieves object from floor without loss of balance)                                           Pertinent Vitals/Pain Pain Assessment Pain Assessment: No/denies pain    Home Living Family/patient expects to be discharged to:: Private residence Living Arrangements: Spouse/significant other;Children Available Help at Discharge: Family;Available 24 hours/day Type of Home: House Home Access: Stairs to enter Entrance Stairs-Rails: None Entrance Stairs-Number of Steps: 1   Home Layout: One level Home Equipment: Agricultural consultant (2 wheels);Cane - single point;Shower seat      Prior Function Prior Level of Function : Independent/Modified Independent                     Extremity/Trunk Assessment   Upper Extremity Assessment Upper Extremity Assessment: Overall WFL for tasks assessed    Lower Extremity Assessment Lower Extremity Assessment: Overall WFL for tasks assessed    Cervical / Trunk Assessment Cervical / Trunk Assessment: Normal  Communication   Communication Communication: Difficulty communicating thoughts/reduced clarity of speech Cueing Techniques: Verbal cues  Cognition Arousal: Alert Behavior During Therapy: WFL for tasks assessed/performed Overall Cognitive Status: Difficult to assess  General Comments: appears appropriate for session, follows multi-step commands well. Higher level cognition not assessed        General Comments General comments (skin integrity, edema, etc.): VSS on RA    Exercises     Assessment/Plan    PT Assessment Patient does not need any further PT services  PT Problem List         PT Treatment  Interventions      PT Goals (Current goals can be found in the Care Plan section)       Frequency       Co-evaluation               AM-PAC PT "6 Clicks" Mobility  Outcome Measure Help needed turning from your back to your side while in a flat bed without using bedrails?: None Help needed moving from lying on your back to sitting on the side of a flat bed without using bedrails?: None Help needed moving to and from a bed to a chair (including a wheelchair)?: None Help needed standing up from a chair using your arms (e.g., wheelchair or bedside chair)?: None Help needed to walk in hospital room?: None Help needed climbing 3-5 steps with a railing? : None 6 Click Score: 24    End of Session   Activity Tolerance: Patient tolerated treatment well Patient left: in bed Nurse Communication: Mobility status PT Visit Diagnosis: Other symptoms and signs involving the nervous system (R29.898)    Time: 1610-9604 PT Time Calculation (min) (ACUTE ONLY): 8 min   Charges:   PT Evaluation $PT Eval Low Complexity: 1 Low   PT General Charges $$ ACUTE PT VISIT: 1 Visit         Arlyss Gandy, PT, DPT Acute Rehabilitation Office [phone]   Arlyss Gandy 04/25/2023, 12:02 PM

## 2023-04-26 ENCOUNTER — Inpatient Hospital Stay (HOSPITAL_COMMUNITY): Payer: Medicare HMO

## 2023-04-26 DIAGNOSIS — I63512 Cerebral infarction due to unspecified occlusion or stenosis of left middle cerebral artery: Secondary | ICD-10-CM | POA: Diagnosis not present

## 2023-04-26 DIAGNOSIS — I359 Nonrheumatic aortic valve disorder, unspecified: Secondary | ICD-10-CM | POA: Diagnosis not present

## 2023-04-26 LAB — ECHOCARDIOGRAM COMPLETE
AR max vel: 0.9 cm2
AV Area VTI: 0.99 cm2
AV Area mean vel: 0.91 cm2
AV Mean grad: 36.1 mm[Hg]
AV Peak grad: 57.8 mm[Hg]
Ao pk vel: 3.8 m/s
Area-P 1/2: 3.14 cm2
Height: 62 in
P 1/2 time: 336 ms
S' Lateral: 2.4 cm
Weight: 1968 [oz_av]

## 2023-04-26 LAB — BASIC METABOLIC PANEL
Anion gap: 11 (ref 5–15)
BUN: 10 mg/dL (ref 8–23)
CO2: 27 mmol/L (ref 22–32)
Calcium: 9.6 mg/dL (ref 8.9–10.3)
Chloride: 104 mmol/L (ref 98–111)
Creatinine, Ser: 0.86 mg/dL (ref 0.44–1.00)
GFR, Estimated: >60 mL/min (ref >60)
Glucose, Bld: 145 mg/dL — ABNORMAL HIGH (ref 70–99)
Potassium: 4.6 mmol/L (ref 3.5–5.1)
Sodium: 142 mmol/L (ref 135–145)

## 2023-04-26 LAB — CBC
HCT: 37.8 % (ref 36.0–46.0)
Hemoglobin: 12.4 g/dL (ref 12.0–15.0)
MCH: 32.8 pg (ref 26.0–34.0)
MCHC: 32.8 g/dL (ref 30.0–36.0)
MCV: 100 fL (ref 80.0–100.0)
Platelets: 291 10*3/uL (ref 150–400)
RBC: 3.78 MIL/uL — ABNORMAL LOW (ref 3.87–5.11)
RDW: 13.4 % (ref 11.5–15.5)
WBC: 11.8 10*3/uL — ABNORMAL HIGH (ref 4.0–10.5)
nRBC: 0 % (ref 0.0–0.2)

## 2023-04-26 LAB — MAGNESIUM: Magnesium: 2.3 mg/dL (ref 1.7–2.4)

## 2023-04-26 LAB — GLUCOSE, CAPILLARY
Glucose-Capillary: 153 mg/dL — ABNORMAL HIGH (ref 70–99)
Glucose-Capillary: 114 mg/dL — ABNORMAL HIGH (ref 70–99)
Glucose-Capillary: 99 mg/dL (ref 70–99)

## 2023-04-26 LAB — PHOSPHORUS: Phosphorus: 3.8 mg/dL (ref 2.5–4.6)

## 2023-04-26 MED ORDER — GABAPENTIN 300 MG PO CAPS: 600.0000 mg | ORAL_CAPSULE | Freq: Every day | ORAL | Status: DC

## 2023-04-26 MED ORDER — ASPIRIN 81 MG PO CHEW: 81.0000 mg | CHEWABLE_TABLET | Freq: Every day | ORAL | 0 refills | Status: AC

## 2023-04-26 MED ORDER — CLOPIDOGREL BISULFATE 75 MG PO TABS: 75.0000 mg | ORAL_TABLET | Freq: Every day | ORAL | 0 refills | Status: DC

## 2023-04-26 MED ORDER — ACETAMINOPHEN 325 MG PO TABS: 650.0000 mg | ORAL_TABLET | Freq: Four times a day (QID) | ORAL | Status: DC | PRN

## 2023-04-26 NOTE — TOC Transition Note (Signed)
Transition of Care [facility]) - CM/SW Discharge Note   Patient Details  Name: Autumn Walker MRN: [id number] Date of Birth: [birth year]  [facility] CM/SW Contact:  Kermit Balo, RN Phone Number: 04/26/2023, 1:15 PM   Clinical Narrative:     Patient is discharging home with self care. No follow up per therapies. Pt has transportation home.  Final next level of care: Home/Self Care Barriers to Discharge: No Barriers Identified   Patient Goals and CMS Choice      Discharge Placement                         Discharge Plan and Services Additional resources added to the After Visit Summary for                                       Social Determinants of Health (SDOH) Interventions SDOH Screenings   Food Insecurity: No Food Insecurity (04/25/2023)  Housing: Low Risk  (04/25/2023)  Transportation Needs: No Transportation Needs (04/25/2023)  Utilities: Not At Risk (04/25/2023)  Alcohol Screen: Low Risk  (01/17/2023)  Depression (PHQ2-9): Low Risk  (01/17/2023)  Financial Resource Strain: Low Risk  (01/17/2023)  Physical Activity: Insufficiently Active (01/17/2023)  Social Connections: Moderately Isolated (01/17/2023)  Stress: No Stress Concern Present (01/17/2023)  Tobacco Use: High Risk (04/24/2023)     Readmission Risk Interventions     No data to display

## 2023-04-26 NOTE — Consult Note (Signed)
CARDIOLOGY CONSULT NOTE       Patient ID: Autumn Walker MRN: [id number] DOB/AGE: [birth year] 72 y.o.  Admit date: 04/24/2023 Referring Physician: Waymon Amato Primary Physician: [name], PA Primary Cardiologist: new Reason for Consultation: AV dx  Principal Problem:   Acute ischemic left MCA stroke [facility])   HPI:  72 y.o. admitted with aphasia and CVA. MRI with left M2 insular branch occlusion. No PAF or carotid dx She is an active smoker with HTN, HLD, COPD, and CKD.  She had an echo in 2021 with mild AR/AS and has not been followed. She has no chest pain. She has noted more exertional dyspnea over the last year. Reviewed her TTE and shows normal EF mean gradient 36 mmHg peak 57.8 mmHg AVA 0.9 cm2 and DVI 0.26.  Also had moderate AR. Her speech has gotten back close to normal with no other focal deficits. She wants to go home tonight She is intolerant to statins but is on zetia with LDL 34 Neurology is Rx her with ASA/Plavix Her Losartan was held post CVA do to low BP. She is using nicotine gum and is motivated to quit smoking < 1/2 PPD  ROS All other systems reviewed and negative except as noted above  Past Medical History:  Diagnosis Date   Allergy    Blood transfusion without reported diagnosis    Cataract    Diabetes mellitus without complication (HCC)     Family History  Problem Relation Age of Onset   Thyroid disease Mother    Heart disease Father    Alcohol abuse Father    Heart attack Father    Hyperlipidemia Father    Depression Sister    Thyroid disease Sister    Thyroid disease Daughter    Diabetes Maternal Grandmother    Heart disease Maternal Grandfather    Cancer Paternal Grandfather        prostate   Hypertension Paternal Grandfather    Alcohol abuse Brother    Colon cancer Neg Hx    Breast cancer Neg Hx     Social History   Socioeconomic History   Marital status: Married    Spouse name: Not on file   Number of children: Not on file    Years of education: Not on file   Highest education level: Not on file  Occupational History   Not on file  Tobacco Use   Smoking status: Some Days    Current packs/day: 0.00    Types: Cigarettes    Passive exposure: Past   Smokeless tobacco: Never   Tobacco comments:    2-3 cigs weekly   Vaping Use   Vaping status: Never Used  Substance and Sexual Activity   Alcohol use: No   Drug use: No   Sexual activity: Yes  Other Topics Concern   Not on file  Social History Narrative   Not on file   Social Determinants of Health   Financial Resource Strain: Low Risk  (01/17/2023)   Overall Financial Resource Strain (CARDIA)    Difficulty of Paying Living Expenses: Not hard at all  Food Insecurity: No Food Insecurity (04/25/2023)   Hunger Vital Sign    Worried About Running Out of Food in the Last Year: Never true    Ran Out of Food in the Last Year: Never true  Transportation Needs: No Transportation Needs (04/25/2023)   PRAPARE - Administrator, Civil Service (Medical): No    Lack of Transportation (Non-Medical): No  Physical Activity: Insufficiently Active (01/17/2023)   Exercise Vital Sign    Days of Exercise per Week: 1 day    Minutes of Exercise per Session: 30 min  Stress: No Stress Concern Present (01/17/2023)   [employer] of Occupational Health - Occupational Stress Questionnaire    Feeling of Stress : Not at all  Social Connections: Moderately Isolated (01/17/2023)   Social Connection and Isolation Panel [NHANES]    Frequency of Communication with Friends and Family: More than three times a week    Frequency of Social Gatherings with Friends and Family: More than three times a week    Attends Religious Services: Never    Database administrator or Organizations: No    Attends Banker Meetings: Never    Marital Status: Married  Catering manager Violence: Not At Risk (04/25/2023)   Humiliation, Afraid, Rape, and Kick questionnaire    Fear of  Current or Ex-Partner: No    Emotionally Abused: No    Physically Abused: No    Sexually Abused: No    Past Surgical History:  Procedure Laterality Date   APPENDECTOMY     CHOLECYSTECTOMY     PANCREATECTOMY     SPLENECTOMY, TOTAL        Current Facility-Administered Medications:    acetaminophen (TYLENOL) tablet 1,000 mg, 1,000 mg, Oral, Q6H PRN, Dolly Rias, MD, 1,000 mg at 04/24/23 2202   albuterol (PROVENTIL) (2.5 MG/3ML) 0.083% nebulizer solution 3 mL, 3 mL, Inhalation, Q4H PRN, Dolly Rias, MD   aspirin chewable tablet 81 mg, 81 mg, Oral, Daily, Segars, Christiane Ha, MD, 81 mg at 04/26/23 4034   clopidogrel (PLAVIX) tablet 75 mg, 75 mg, Oral, Daily, Marvel Plan, MD, 75 mg at 04/26/23 0808   enoxaparin (LOVENOX) injection 40 mg, 40 mg, Subcutaneous, Q24H, Segars, Christiane Ha, MD, 40 mg at 04/25/23 2159   ezetimibe (ZETIA) tablet 10 mg, 10 mg, Oral, QPC supper, Dolly Rias, MD, 10 mg at 04/26/23 1806   gabapentin (NEURONTIN) capsule 600 mg, 600 mg, Oral, QHS, Segars, Christiane Ha, MD, 600 mg at 04/25/23 2159   insulin aspart (novoLOG) injection 0-6 Units, 0-6 Units, Subcutaneous, TID WC, Segars, Christiane Ha, MD, 1 Units at 04/26/23 0808   montelukast (SINGULAIR) tablet 10 mg, 10 mg, Oral, Daily PRN, Dolly Rias, MD, 10 mg at 04/24/23 2334   nicotine polacrilex (NICORETTE) gum 2 mg, 2 mg, Oral, PRN, Dolly Rias, MD  aspirin  81 mg Oral Daily   clopidogrel  75 mg Oral Daily   enoxaparin (LOVENOX) injection  40 mg Subcutaneous Q24H   ezetimibe  10 mg Oral QPC supper   gabapentin  600 mg Oral QHS   insulin aspart  0-6 Units Subcutaneous TID WC     Physical Exam: Blood pressure (!) 128/55, pulse 76, temperature 98.4 F (36.9 C), temperature source Axillary, resp. rate 17, height 5\' 2"  (1.575 m), weight 55.8 kg, SpO2 97%.    Elderly female Mild aphasia No other focal deficits Severe AS/AR murmur radiating to right chest/neck Abdomen benign No edema Palpable pedal  pulses   Labs:   Lab Results  Component Value Date   WBC 11.8 (H) 04/26/2023   HGB 12.4 04/26/2023   HCT 37.8 04/26/2023   MCV 100.0 04/26/2023   PLT 291 04/26/2023    Recent Labs  Lab 04/24/23 1848 04/24/23 1856 04/26/23 0551  NA 135   < > 142  K 3.9   < > 4.6  CL 102   < > 104  CO2 21*   < >  27  BUN 12   < > 10  CREATININE 0.94   < > 0.86  CALCIUM 9.4   < > 9.6  PROT 7.2  --   --   BILITOT 0.6  --   --   ALKPHOS 37*  --   --   ALT 14  --   --   AST 21  --   --   GLUCOSE 103*   < > 145*   < > = values in this interval not displayed.   Lab Results  Component Value Date   CKTOTAL 39 01/11/2008   CKMB 0.7 01/11/2008   TROPONINI <0.01        NO INDICATION OF MYOCARDIAL INJURY. 01/11/2008    Lab Results  Component Value Date   CHOL 99 04/25/2023   CHOL 103 01/06/2023   CHOL 111 09/13/2022   Lab Results  Component Value Date   HDL 40 (L) 04/25/2023   HDL 41 01/06/2023   HDL 47 09/13/2022   Lab Results  Component Value Date   LDLCALC 34 04/25/2023   LDLCALC 31 01/06/2023   LDLCALC 40 09/13/2022   Lab Results  Component Value Date   TRIG 124 04/25/2023   TRIG 198 (H) 01/06/2023   TRIG 142 09/13/2022   Lab Results  Component Value Date   CHOLHDL 2.5 04/25/2023   CHOLHDL 2.5 01/06/2023   CHOLHDL 2.4 09/13/2022   Lab Results  Component Value Date   LDLDIRECT 171.2 02/10/2009      Radiology: ECHOCARDIOGRAM COMPLETE  Result Date: 04/26/2023    ECHOCARDIOGRAM REPORT   Patient Name:   [facility] Date of Exam: 04/26/2023 Medical Rec #:  [id number]                Height:       62.0 in Accession #:    [id number]               Weight:       123.0 lb Date of Birth:  [birth year]                 BSA:          1.555 m Patient Age:    72 years                 BP:           96/52 mmHg Patient Gender: F                        HR:           82 bpm. Exam Location:  Inpatient Procedure: 2D Echo, Color Doppler and Cardiac Doppler Indications:     Stroke   History:         Patient has prior history of Echocardiogram examinations, most                  recent 04/23/2020. Stroke and , Pulmonary Emphysema; Risk                  Factors:Dyslipidemia, Diabetes and Current Smoker.  Sonographer:     Milbert Coulter Referring Phys:  [id number] [name] Diagnosing Phys: Lennie Odor MD IMPRESSIONS  1. Severely calcified aortic valve. Vmax 3.8 m/s, MG 36 mmHG, AVA 0.99 cm2, DI 0. 26. Moderate AI. Moderate to severe aortic stenosis. Combination of moderate AI and moderate to severe AS is overall severe aortic valve disease. The aortic valve is  calcified. There is severe calcifcation of the aortic valve. There is severe thickening of the aortic valve. Aortic valve regurgitation is moderate. Moderate to severe aortic valve stenosis. Aortic valve area, by VTI measures 0.99 cm. Aortic valve mean gradient measures 36.1 mmHg. Aortic valve Vmax measures 3.80 m/s.  2. Left ventricular ejection fraction, by estimation, is 60 to 65%. The left ventricle has normal function. The left ventricle has no regional wall motion abnormalities. Left ventricular diastolic parameters are consistent with Grade I diastolic dysfunction (impaired relaxation).  3. Right ventricular systolic function is normal. The right ventricular size is normal. Tricuspid regurgitation signal is inadequate for assessing PA pressure.  4. The mitral valve is grossly normal. No evidence of mitral valve regurgitation. No evidence of mitral stenosis.  5. The inferior vena cava is normal in size with greater than 50% respiratory variability, suggesting right atrial pressure of 3 mmHg. FINDINGS  Left Ventricle: Left ventricular ejection fraction, by estimation, is 60 to 65%. The left ventricle has normal function. The left ventricle has no regional wall motion abnormalities. The left ventricular internal cavity size was normal in size. There is  no left ventricular hypertrophy. Left ventricular diastolic parameters are  consistent with Grade I diastolic dysfunction (impaired relaxation). Right Ventricle: The right ventricular size is normal. No increase in right ventricular wall thickness. Right ventricular systolic function is normal. Tricuspid regurgitation signal is inadequate for assessing PA pressure. Left Atrium: Left atrial size was normal in size. Right Atrium: Right atrial size was normal in size. Pericardium: There is no evidence of pericardial effusion. Presence of epicardial fat layer. Mitral Valve: The mitral valve is grossly normal. No evidence of mitral valve regurgitation. No evidence of mitral valve stenosis. Tricuspid Valve: The tricuspid valve is grossly normal. Tricuspid valve regurgitation is trivial. No evidence of tricuspid stenosis. Aortic Valve: Severely calcified aortic valve. Vmax 3.8 m/s, MG 36 mmHG, AVA 0.99 cm2, DI 0. 26. Moderate AI. Moderate to severe aortic stenosis. Combination of moderate AI and moderate to severe AS is overall severe aortic valve disease. The aortic valve is calcified. There is severe calcifcation of the aortic valve. There is severe thickening of the aortic valve. Aortic valve regurgitation is moderate. Aortic regurgitation PHT measures 336 msec. Moderate to severe aortic stenosis is present. Aortic valve mean gradient measures 36.1 mmHg. Aortic valve peak gradient measures 57.8 mmHg. Aortic valve area, by VTI measures 0.99 cm. Pulmonic Valve: The pulmonic valve was grossly normal. Pulmonic valve regurgitation is not visualized. No evidence of pulmonic stenosis. Aorta: The aortic root and ascending aorta are structurally normal, with no evidence of dilitation. Venous: The inferior vena cava is normal in size with greater than 50% respiratory variability, suggesting right atrial pressure of 3 mmHg. IAS/Shunts: The atrial septum is grossly normal.  LEFT VENTRICLE PLAX 2D LVIDd:         3.70 cm   Diastology LVIDs:         2.40 cm   LV e' medial:    6.96 cm/s LV PW:         0.90 cm    LV E/e' medial:  11.2 LV IVS:        1.00 cm   LV e' lateral:   11.50 cm/s LVOT diam:     2.20 cm   LV E/e' lateral: 6.8 LV SV:         89 LV SV Index:   57 LVOT Area:     3.80 cm  RIGHT VENTRICLE RV  Basal diam:  2.70 cm RV Mid diam:    1.80 cm RV S prime:     12.50 cm/s TAPSE (M-mode): 1.8 cm LEFT ATRIUM             Index        RIGHT ATRIUM           Index LA diam:        2.60 cm 1.67 cm/m   RA Area:     10.10 cm LA Vol (A2C):   53.1 ml 34.15 ml/m  RA Volume:   18.60 ml  11.96 ml/m LA Vol (A4C):   31.4 ml 20.19 ml/m LA Biplane Vol: 43.8 ml 28.17 ml/m  AORTIC VALVE AV Area (Vmax):    0.90 cm AV Area (Vmean):   0.91 cm AV Area (VTI):     0.99 cm AV Vmax:           380.19 cm/s AV Vmean:          272.957 cm/s AV VTI:            0.897 m AV Peak Grad:      57.8 mmHg AV Mean Grad:      36.1 mmHg LVOT Vmax:         90.14 cm/s LVOT Vmean:        65.607 cm/s LVOT VTI:          0.234 m LVOT/AV VTI ratio: 0.26 AI PHT:            336 msec  AORTA Ao Root diam: 3.10 cm Ao Asc diam:  3.10 cm MITRAL VALVE MV Area (PHT): 3.14 cm     SHUNTS MV Decel Time: 242 msec     Systemic VTI:  0.23 m MV E velocity: 77.80 cm/s   Systemic Diam: 2.20 cm MV A velocity: 143.00 cm/s MV E/A ratio:  0.54 Lennie Odor MD Electronically signed by Lennie Odor MD Signature Date/Time: 04/26/2023/3:45:37 PM    Final (Updated)    MR BRAIN WO CONTRAST  Result Date: 04/25/2023 CLINICAL DATA:  Stroke, follow-up. EXAM: MRI HEAD WITHOUT CONTRAST TECHNIQUE: Multiplanar, multiecho pulse sequences of the brain and surrounding structures were obtained without intravenous contrast. COMPARISON:  Non-contrast head CT and CT angiogram head/neck 04/24/2023. FINDINGS: Brain: No age advanced or lobar predominant parenchymal atrophy. Acute left MCA territory cortical/subcortical infarct affecting portions of the anterosuperior left temporal lobe and left insula, spanning 3.2 cm. Parenchymal swelling at this site. No midline shift or evidence of  hemorrhagic conversion. Minimal multifocal T2 FLAIR hyperintense signal abnormality elsewhere within the cerebral white matter, nonspecific but compatible with chronic small vessel ischemic disease. No evidence of an intracranial mass. No extra-axial fluid collection. Vascular: Occlusion of an M2 left middle cerebral artery vessel was demonstrated on the CTA head/neck of 04/24/2023. Flow voids preserved elsewhere within the proximal large arterial vessels. Skull and upper cervical spine: No focal suspicious marrow lesion. Sinuses/Orbits: No mass or acute finding within the imaged orbits. Mild mucosal thickening within the bilateral ethmoid sinuses. Trace mucosal thickening within the bilateral frontal and maxillary sinuses. Other: Trace fluid within the bilateral mastoid air cells. IMPRESSION: 1. Acute left MCA territory cortical/subcortical infarct affecting portions of the anterosuperior left temporal lobe and left insula, as described. 2. Minimal background cerebral white matter chronic small vessel ischemic disease. 3. Mild paranasal sinus mucosal thickening. Electronically Signed   By: Jackey Loge D.O.   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 Imaging results were communicated on 04/24/2023 at 7:45 pm to provider Dr. Wilford Corner via secure text paging. Electronically Signed   By: Wiliam Ke M.D.   On: 04/24/2023 19:46   Result Date: 04/24/2023 CLINICAL DATA:  Aphasia, stroke suspected EXAM: CT ANGIOGRAPHY HEAD AND NECK WITH AND WITHOUT CONTRAST CT PERFUSION TECHNIQUE: Multidetector CT imaging of the head and neck was performed using the standard protocol during bolus administration of intravenous contrast. Multiplanar CT image  reconstructions and MIPs were obtained to evaluate the vascular anatomy. Carotid stenosis measurements (when applicable) are obtained utilizing NASCET criteria, using the distal internal carotid diameter as the denominator. Multiphase CT imaging of the brain was performed following IV bolus contrast injection. Subsequent parametric perfusion maps were calculated using RAPID software. RADIATION DOSE REDUCTION: This exam was performed according to the departmental dose-optimization program which includes automated exposure control, adjustment of the mA and/or kV according to patient size and/or use of iterative reconstruction technique. CONTRAST:  75 mL Omnipaque 350 COMPARISON:  No prior CTA available. FINDINGS: CT HEAD FINDINGS For noncontrast findings, please see same day CT head. CTA NECK FINDINGS Aortic arch: Standard branching. Imaged portion shows no evidence of aneurysm or dissection. No significant stenosis of the major arch vessel origins. Aortic atherosclerosis. Right carotid system: No evidence of dissection, occlusion, or hemodynamically significant stenosis (greater than 50%). Left carotid system: No evidence of dissection, occlusion, or hemodynamically significant stenosis (greater than 50%). Vertebral arteries: No evidence of dissection, occlusion, or hemodynamically significant stenosis (greater than 50%). Skeleton: No acute osseous abnormality. Degenerative changes in the cervical spine. Other neck: No acute finding. Upper chest: Centrilobular nodules, likely smoking related lung disease. Emphysematous changes. No focal pulmonary opacity or pleural effusion. Review of the MIP images confirms the above findings CTA HEAD FINDINGS Anterior circulation: Both internal carotid arteries are patent to the termini, with moderate stenosis in the right supraclinoid ICA. A1 segments patent, hypoplastic on the right. Normal anterior communicating artery. Anterior cerebral arteries are patent to their distal  aspects without significant stenosis. No M1 stenosis or occlusion. 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 Right MCA branches perfused to their distal aspects without significant stenosis. Posterior circulation: Vertebral arteries patent to the vertebrobasilar junction without significant stenosis. Posterior inferior cerebellar arteries patent proximally. Basilar patent to its distal aspect without significant stenosis. Superior cerebellar arteries patent proximally. Patent left P1. Fetal origin of the right PCA from the right posterior communicating artery. PCAs perfused to their distal aspects without significant stenosis. Venous sinuses: As permitted by contrast timing, patent. Anatomic variants: Fetal origin of the right PCA. No evidence of aneurysm or vascular malformation. Review of the MIP images confirms the above findings CT Brain Perfusion Findings: ASPECTS: 8 CBF (<30%) Volume: 0mL Perfusion (Tmax>6.0s) volume: 9mL Mismatch Volume: 9mL Infarction Location:No infarct core; area of decreased perfusion in posterior left MCA territory, which does not correlate with the area of hypodensity on the same-day CT. IMPRESSION: 1. Occlusion of the left M2 insular branch, with reconstitution and intermittent poor perfusion of the  more distal left M2. 2. No other intracranial large vessel occlusion or significant stenosis. 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 3. No hemodynamically significant stenosis in the neck. 4. No infarct core; area of decreased perfusion in the posterior left MCA territory, which does not correlate with the area of hypodensity on the same-day CT and is favored to be related to the poor perfusion seen in the more distal left MCA. Aortic Atherosclerosis  (ICD10-I70.0) and Emphysema (ICD10-J43.9). Imaging results were communicated on 04/24/2023 at 7:28 pm to provider Dr. Wilford Corner via secure text paging. Electronically Signed: By: Wiliam Ke M.D. On: 04/24/2023 19:35   CT HEAD CODE STROKE WO CONTRAST  Result Date: 04/24/2023 CLINICAL DATA:  Code stroke.  Aphasia EXAM: CT HEAD WITHOUT CONTRAST TECHNIQUE: Contiguous axial images were obtained from the base of the skull through the vertex without intravenous contrast. RADIATION DOSE REDUCTION: This exam was performed according to the departmental dose-optimization program which includes automated exposure control, adjustment of the mA and/or kV according to patient size and/or use of iterative reconstruction technique. COMPARISON:  None Available. FINDINGS: Brain: Hypodensity in the anterior left temporal lobe (series 2, image 9). Some loss of the left insular ribbon (series 2, image 12). No hemorrhage, mass, mass effect, or midline shift. No hydrocephalus or extra-axial collection. Vascular: S. Mall focus of hyperdensity in the left MCA (series 2, image 10). Skull: Negative for fracture or focal lesion. Sinuses/Orbits: Mucosal thickening in the ethmoid air cells. No acute finding in the orbits. Other: The mastoid air cells are well aerated. ASPECTS [facility] Stroke Program Early CT Score) - Ganglionic level infarction (caudate, lentiform nuclei, internal capsule, insula, M1-M3 cortex): 5 - Supraganglionic infarction (M4-M6 cortex): 3 Total score (0-10 with 10 being normal): 8 . IMPRESSION: 1. Hypodensity in the anterior left temporal lobe, with some loss of the left insular ribbon, concerning for acute infarct. ASPECTS is 8. 2. No intracranial hemorrhage. Imaging results were communicated on 04/24/2023 at 7:23 pm to provider Dr. Wilford Corner via secure text paging. Electronically Signed   By: Wiliam Ke M.D.   On: 04/24/2023 19:09    EKG: NSR no acute changes   ASSESSMENT AND PLAN:   CVA:  not likely embolic or  related to valve dx Continue ASA/Plavix along with Zetia and quit smoking Aphasia largely gone and speaking well at this time Will arrange outpatient monitor to r/o PAF AS/AR:  she is not a candidate for acute w/u with recent CVA She has had more dyspnea. Ultimately she will need TAVR w/u I think this is better option at her age and with CVA Explained that this involves heart cath , CTA, and consults with our structural cardiologist and CVTS. Would wait 4 weeks before cath given CVA. Will arrange f/u with structural PA to get her in system and consider CTA to size valve in 2 weeks  HLD:  continue Zetia HTN:  ARB held follow as outpatient Smoking cessation discussed nicotine replacement  She has history of lung nodules on CT 10/29/21 that need f/u Largest 9 mm in left base   Signed: Charlton Haws 04/26/2023, 6:59 PM

## 2023-04-26 NOTE — Discharge Summary (Addendum)
Physician Discharge Summary  Autumn Walker UXL:[id number] DOB: 20-Sep-1950  PCP: [name], PA  Admitted from: Home Discharged to: Home  Admit date: 04/24/2023 Discharge date: 04/26/2023  Recommendations for Outpatient Follow-up:    Follow-up Information     [facility]. Schedule an appointment as soon as possible for a visit in 1 month(s).   Specialty: Neurology Why: stroke clinic Contact information: [address] [place] [address] [phone]        [name], [place]. Schedule an appointment as soon as possible for a visit in 1 week(s).   Specialty: Family Medicine Why: To be seen with repeat labs (CBC & BMP). Contact information: [address] [place] [address] [phone]          [facility] Follow up.   Why: MDs office will arrange for you to get an outpatient heart monitor and MD follow-up. Contact information: [address] [place] [address] [phone]                 Home Health: None recommended by therapies evaluation    Equipment/Devices: None    Discharge Condition: Improved and stable.   Code Status: Full Code Diet recommendation:  Discharge Diet Orders (From admission, onward)     Start     Ordered   04/26/23 0000  Diet - low sodium heart healthy        04/26/23 1254   04/26/23 0000  Diet Carb Modified        04/26/23 1254             Discharge Diagnoses:  Principal Problem:   Acute ischemic left MCA stroke [facility])   Brief Summary: 72 yrs old female with PMH significant of active tobacco use , hypertension, hyperlipidemia, CKD 2, COPD, presented in the ED with C/O : recurrent aphasia.  She was watching movie at 4 pm on day PTA and suddenly was unable to comprehend speech.  She also reports expressive aphasia, says she was unable to tell her  husband what groceries she wanted from the store.  Her symptoms continued until approximately 10 PM when suddenly they abated. She felt mostly back to her normal and could comprehend and express herself better.  On morning of ED visit, remained near her baseline, but she again developed similar symptoms of expressive aphasia and came here in ED.   CT head showed hypodensity in the left insular ribbon and left anterior temporal lobe.  Patient is admitted for further evaluation.   Assessment & Plan:  Acute ischemic stroke: Left M2 insular branch occlusion Patient presented with expressive aphasia, visual disturbance. CT head showed hypodensity in the left insular ribbon and left anterior temporal lobe. Last known well time 10/13/4 p.m. Transient symptom improvements after few hours. CTA head & neck 10/14 Occlusion of the left M2 insular branch, with reconstitution and intermittent poor perfusion of the more distal left M2.  CT perfusion with area of hypoperfusion in the distal left MCA territory 9 cc penumbra.   MRI BRAIN  Acute left MCA territory cortical/subcortical infarct affecting portions of the anterosuperior left temporal lobe and left insula, as described. 2D echo shows severe aortic stenosis, see discussion below Neurology signed off from yesterday appreciated.  They recommended 30-day cardiac event monitoring as outpatient to rule out A-fib.  [facility] heart care card master contacted to arrange the same.  Telemetry  shows sinus rhythm. She was out of the window for TNK and thrombectomy.  Etiology of stroke is large vessel occlusion suspected cardioembolic.  LDL 34, Hb A1c 6.1 well-controlled Intolerant to statins.  Continue home dose of Zetia. As per neurology, no antithrombotics prior to admission, now on aspirin 81 Mg daily + Plavix 75 Mg daily for 3 months and then aspirin alone. Therapies have evaluated and no recommendations for home. Outpatient follow-up with neurology. Smoking  cessation counseled.  Tobacco use: Current smoker at 1 quart a pack a day Nicotine gum 2 mg prn cravings, ready to quit. Counseled on cessation    Hypertension:  Discontinued losartan due to ongoing soft blood pressures with SBP in the 90s-100s, given recent acute stroke.  Follow-up closely with PCP and can consider resuming if blood pressures are better or increase   Hyperlipidemia:  Previous intolerance to statins and LDL at goal.  Therefore continue prior home dose of Zetia.   COPD:  Continue home albuterol prn, montelukast.  Smoking cessation above. Stable  Type II DM, well-controlled A1c 6.1.  Continue home dose of metformin.  Severe aortic stenosis As communicated with reading cardiologist, severe aortic stenosis, hence could not do bubble study. Requested cardiology consultation and await their input prior to consideration of DC.  This may take several hours and hence patient may not DC until tomorrow.  Updated patient Also per neurology, does not need bubble study to complete stroke workup.   Consultations: Neurology  Procedures: None   Discharge Instructions  Discharge Instructions     Ambulatory referral to Neurology   Complete by: As directed    Follow up with stroke clinic NP (Jessica Vanschaick or Darrol Angel, if both not available, consider Manson Allan, or [facility]) at [facility] in about 4 weeks. Thanks.   Call MD for:   Complete by: As directed    Recurrent strokelike symptoms.   Call MD for:  extreme fatigue   Complete by: As directed    Call MD for:  persistant dizziness or light-headedness   Complete by: As directed    Diet - low sodium heart healthy   Complete by: As directed    Diet Carb Modified   Complete by: As directed    Increase activity slowly   Complete by: As directed         Medication List     STOP taking these medications    ibuprofen 200 MG tablet Commonly known as: ADVIL   losartan 25 MG tablet Commonly known as:  COZAAR       TAKE these medications    acetaminophen 325 MG tablet Commonly known as: TYLENOL Take 2 tablets (650 mg total) by mouth every 6 (six) hours as needed (Pain).   albuterol 108 (90 Base) MCG/ACT inhaler Commonly known as: VENTOLIN HFA INHALE 1-2 PUFFS INTO THE LUNGS EVERY 4 (FOUR) HOURS AS NEEDED FOR WHEEZING OR SHORTNESS OF BREATH.   aspirin 81 MG chewable tablet Chew 1 tablet (81 mg total) by mouth daily. Start taking on: April 27, 2023   calcium carbonate 750 MG chewable tablet Commonly known as: TUMS EX Chew 2 tablets by mouth daily as needed for heartburn (Indigestion).   clopidogrel 75 MG tablet Commonly known as: PLAVIX Take 1 tablet (75 mg total) by mouth daily. Start taking on: April 27, 2023   ezetimibe 10 MG tablet Commonly known as: ZETIA TAKE 1 TABLET (10 MG TOTAL) BY MOUTH DAILY AFTER SUPPER.   fluticasone 50 MCG/ACT nasal spray Commonly  known as: FLONASE USE 1 SPRAY IN EACH NOSTRIL TWICE DAILY FOLLOWING SINUS RINSES   gabapentin 300 MG capsule Commonly known as: NEURONTIN Take 2 capsules (600 mg total) by mouth at bedtime.   metFORMIN 1000 MG tablet Commonly known as: GLUCOPHAGE Take 1 tablet (1,000 mg total) by mouth 2 (two) times daily with a meal.   montelukast 10 MG tablet Commonly known as: SINGULAIR TAKE 1 TABLET BY MOUTH EVERYDAY AT BEDTIME What changed: See the new instructions.   OVER THE COUNTER MEDICATION Take 2-3 tablets by mouth every 4 (four) hours as needed (leg cramps). : Medication name-Hyland's Homeopathic:  Place 2-3 tabs under tongue every 4 hours . If needed, place 2-3 additional tablets under the tongue every 15 mins for up to 6 doses.   Vitamin D (Ergocalciferol) 1.25 MG (50000 UNIT) Caps capsule Commonly known as: DRISDOL Take 50,000 Units by mouth every 7 (seven) days. Take on Saturdays       Allergies  Allergen Reactions   Statins     Severe leg cramps   Codeine Nausea Only       Procedures/Studies: MR BRAIN WO CONTRAST  Result Date: 04/25/2023 CLINICAL DATA:  Stroke, follow-up. EXAM: MRI HEAD WITHOUT CONTRAST TECHNIQUE: Multiplanar, multiecho pulse sequences of the brain and surrounding structures were obtained without intravenous contrast. COMPARISON:  Non-contrast head CT and CT angiogram head/neck 04/24/2023. FINDINGS: Brain: No age advanced or lobar predominant parenchymal atrophy. Acute left MCA territory cortical/subcortical infarct affecting portions of the anterosuperior left temporal lobe and left insula, spanning 3.2 cm. Parenchymal swelling at this site. No midline shift or evidence of hemorrhagic conversion. Minimal multifocal T2 FLAIR hyperintense signal abnormality elsewhere within the cerebral white matter, nonspecific but compatible with chronic small vessel ischemic disease. No evidence of an intracranial mass. No extra-axial fluid collection. Vascular: Occlusion of an M2 left middle cerebral artery vessel was demonstrated on the CTA head/neck of 04/24/2023. Flow voids preserved elsewhere within the proximal large arterial vessels. Skull and upper cervical spine: No focal suspicious marrow lesion. Sinuses/Orbits: No mass or acute finding within the imaged orbits. Mild mucosal thickening within the bilateral ethmoid sinuses. Trace mucosal thickening within the bilateral frontal and maxillary sinuses. Other: Trace fluid within the bilateral mastoid air cells. IMPRESSION: 1. Acute left MCA territory cortical/subcortical infarct affecting portions of the anterosuperior left temporal lobe and left insula, as described. 2. Minimal background cerebral white matter chronic small vessel ischemic disease. 3. Mild paranasal sinus mucosal thickening. Electronically Signed   By: Jackey Loge D.O.   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 Imaging results were communicated on 04/24/2023 at 7:45 pm to provider Dr. Wilford Corner via secure text paging. Electronically Signed   By: Wiliam Ke M.D.   On: 04/24/2023 19:46   Result Date: 04/24/2023 CLINICAL DATA:  Aphasia, stroke suspected EXAM: CT ANGIOGRAPHY HEAD AND NECK WITH AND WITHOUT CONTRAST CT PERFUSION TECHNIQUE: Multidetector CT imaging of the head and neck was performed using the standard protocol during bolus administration of intravenous contrast. Multiplanar CT image reconstructions and MIPs were obtained to evaluate the vascular anatomy. Carotid stenosis measurements (when applicable) are obtained utilizing NASCET criteria, using the distal internal carotid diameter as the denominator. Multiphase CT imaging  of the brain was performed following IV bolus contrast injection. Subsequent parametric perfusion maps were calculated using RAPID software. RADIATION DOSE REDUCTION: This exam was performed according to the departmental dose-optimization program which includes automated exposure control, adjustment of the mA and/or kV according to patient size and/or use of iterative reconstruction technique. CONTRAST:  75 mL Omnipaque 350 COMPARISON:  No prior CTA available. FINDINGS: CT HEAD FINDINGS For noncontrast findings, please see same day CT head. CTA NECK FINDINGS Aortic arch: Standard branching. Imaged portion shows no evidence of aneurysm or dissection. No significant stenosis of the major arch vessel origins. Aortic atherosclerosis. Right carotid system: No evidence of dissection, occlusion, or hemodynamically significant stenosis (greater than 50%). Left carotid system: No evidence of dissection, occlusion, or hemodynamically significant stenosis (greater than 50%). Vertebral arteries: No evidence of dissection, occlusion, or hemodynamically significant  stenosis (greater than 50%). Skeleton: No acute osseous abnormality. Degenerative changes in the cervical spine. Other neck: No acute finding. Upper chest: Centrilobular nodules, likely smoking related lung disease. Emphysematous changes. No focal pulmonary opacity or pleural effusion. Review of the MIP images confirms the above findings CTA HEAD FINDINGS Anterior circulation: Both internal carotid arteries are patent to the termini, with moderate stenosis in the right supraclinoid ICA. A1 segments patent, hypoplastic on the right. Normal anterior communicating artery. Anterior cerebral arteries are patent to their distal aspects without significant stenosis. No M1 stenosis or occlusion. 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 Right MCA branches perfused to their distal aspects without significant stenosis. Posterior circulation: Vertebral arteries patent to the vertebrobasilar junction without significant stenosis. Posterior inferior cerebellar arteries patent proximally. Basilar patent to its distal aspect without significant stenosis. Superior cerebellar arteries patent proximally. Patent left P1. Fetal origin of the right PCA from the right posterior communicating artery. PCAs perfused to their distal aspects without significant stenosis. Venous sinuses: As permitted by contrast timing, patent. Anatomic variants: Fetal origin of the right PCA. No evidence of aneurysm or vascular malformation. Review of the MIP images confirms the above findings CT Brain Perfusion Findings: ASPECTS: 8 CBF (<30%) Volume: 0mL Perfusion (Tmax>6.0s) volume: 9mL Mismatch Volume: 9mL Infarction Location:No infarct core; area of decreased perfusion in posterior left MCA territory, which does not correlate with  the area of hypodensity on the same-day CT. IMPRESSION: 1. Occlusion of the left M2 insular branch, with reconstitution and intermittent poor perfusion of the more distal left M2. 2. No other intracranial large vessel occlusion or significant stenosis. 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 3. No hemodynamically significant stenosis in the neck. 4. No infarct core; area of decreased perfusion in the posterior left MCA territory, which does not correlate with the area of hypodensity on the same-day CT and is favored to be related to the poor perfusion seen in the more distal left MCA. Aortic Atherosclerosis (ICD10-I70.0) and Emphysema (ICD10-J43.9). Imaging results were communicated on 04/24/2023 at 7:28 pm to provider Dr. Wilford Corner via secure text paging. Electronically Signed: By: Wiliam Ke M.D. On: 04/24/2023 19:35   CT HEAD CODE STROKE WO CONTRAST  Result Date: 04/24/2023 CLINICAL DATA:  Code stroke.  Aphasia EXAM: CT HEAD WITHOUT CONTRAST TECHNIQUE: Contiguous axial images were obtained from the base of the skull through the vertex without intravenous contrast. RADIATION DOSE REDUCTION: This  exam was performed according to the departmental dose-optimization program which includes automated exposure control, adjustment of the mA and/or kV according to patient size and/or use of iterative reconstruction technique. COMPARISON:  None Available. FINDINGS: Brain: Hypodensity in the anterior left temporal lobe (series 2, image 9). Some loss of the left insular ribbon (series 2, image 12). No hemorrhage, mass, mass effect, or midline shift. No hydrocephalus or extra-axial collection. Vascular: S. Mall focus of hyperdensity in the left MCA (series 2, image 10). Skull: Negative for fracture or focal lesion. Sinuses/Orbits: Mucosal thickening in the ethmoid air cells. No acute finding in the orbits. Other: The mastoid air cells are well aerated. ASPECTS [facility] Stroke Program Early CT Score) - Ganglionic  level infarction (caudate, lentiform nuclei, internal capsule, insula, M1-M3 cortex): 5 - Supraganglionic infarction (M4-M6 cortex): 3 Total score (0-10 with 10 being normal): 8 . IMPRESSION: 1. Hypodensity in the anterior left temporal lobe, with some loss of the left insular ribbon, concerning for acute infarct. ASPECTS is 8. 2. No intracranial hemorrhage. Imaging results were communicated on 04/24/2023 at 7:23 pm to provider Dr. Wilford Corner via secure text paging. Electronically Signed   By: Wiliam Ke M.D.   On: 04/24/2023 19:09      Subjective: Reports that she feels much better.  Still has some speech issues intermittently but significantly improved compared to admission.  Denies any other complaints.  Discharge Exam:  Vitals:   04/26/23 0003 04/26/23 0347 04/26/23 0827 04/26/23 1141  BP: (!) 91/52 (!) 103/48 (!) 96/52 94/79  Pulse: 81 76 77 72  Resp: 18 17 17 18   Temp: 97.8 F (36.6 C) 97.7 F (36.5 C) 98 F (36.7 C) 97.8 F (36.6 C)  TempSrc:   Oral Oral  SpO2: 97% 92% 98% 98%  Weight:      Height:        General: Elderly female, small built and moderately nourished sitting up comfortably in bed without distress. Cardiovascular: S1 & S2 heard, RRR, S1/S2 +. No murmurs, rubs, gallops or clicks. No JVD or pedal edema.  Telemetry personally reviewed: Sinus rhythm. Respiratory: Clear to auscultation without wheezing, rhonchi or crackles. No increased work of breathing. Abdominal:  Non distended, non tender & soft. No organomegaly or masses appreciated. Normal bowel sounds heard. CNS: Alert and oriented.  No facial asymmetry.  No dysarthria.  Questionable mild intermittent expressive aphasia at this time. Extremities: no edema, no cyanosis    The results of significant diagnostics from this hospitalization (including imaging, microbiology, ancillary and laboratory) are listed below for reference.     Microbiology: No results found for this or any previous visit (from the past  240 hour(s)).   Labs: CBC: Recent Labs  Lab 04/24/23 1848 04/24/23 1856 04/25/23 0405 04/26/23 0551  WBC 13.7*  --  14.7* 11.8*  NEUTROABS 7.6  --   --   --   HGB 13.0 13.6 11.8* 12.4  HCT 40.2 40.0 36.2 37.8  MCV 100.2*  --  98.9 100.0  PLT 299  --  274 291    Basic Metabolic Panel: Recent Labs  Lab 04/24/23 1848 04/24/23 1856 04/25/23 0405 04/26/23 0551  NA 135 135 135 142  K 3.9 3.9 3.7 4.6  CL 102 103 104 104  CO2 21*  --  21* 27  GLUCOSE 103* 102* 103* 145*  BUN 12 13 10 10   CREATININE 0.94 0.90 0.97 0.86  CALCIUM 9.4  --  8.7* 9.6  MG  --   --  1.7 2.3  PHOS  --   --  3.1 3.8    Liver Function Tests: Recent Labs  Lab 04/24/23 1848  AST 21  ALT 14  ALKPHOS 37*  BILITOT 0.6  PROT 7.2  ALBUMIN 4.1    CBG: Recent Labs  Lab 04/25/23 0743 04/25/23 1153 04/25/23 1708 04/26/23 0629 04/26/23 1140  GLUCAP 85 113* 105* 153* 114*    Hgb A1c Recent Labs    04/25/23 0405  HGBA1C 6.1*    Lipid Profile Recent Labs    04/25/23 0405  CHOL 99  HDL 40*  LDLCALC 34  TRIG 409  CHOLHDL 2.5       Time coordinating discharge: 35 minutes  SIGNED:  Marcellus Scott, MD,  FACP, [facility], [facility], [facility]   [facility] [place]     To contact the attending provider between 7A-7P or the covering provider during after hours 7P-7A, please log into the web site [email or web address] and access using universal [place] password for that web site. If you do not have the password, please call the hospital operator.

## 2023-04-26 NOTE — Discharge Instructions (Signed)

## 2023-04-26 NOTE — Plan of Care (Signed)
  Problem: Pain Managment: Goal: General experience of comfort will improve Outcome: Progressing   Problem: Safety: Goal: Ability to remain free from injury will improve Outcome: Progressing   

## 2023-04-26 NOTE — Progress Notes (Signed)
Patient ready for discharge. AVS printed and discussed with patient and spouse at bedside. Patient understands discharge education. PIV and telemetry removed.
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
## 2023-04-27 ENCOUNTER — Other Ambulatory Visit: Payer: Self-pay | Admitting: Family Medicine

## 2023-04-27 ENCOUNTER — Telehealth: Payer: Self-pay | Admitting: Nurse Practitioner

## 2023-04-27 ENCOUNTER — Telehealth: Payer: Self-pay

## 2023-04-27 DIAGNOSIS — E1169 Type 2 diabetes mellitus with other specified complication: Secondary | ICD-10-CM

## 2023-04-27 DIAGNOSIS — N1832 Chronic kidney disease, stage 3b: Secondary | ICD-10-CM

## 2023-04-27 NOTE — Telephone Encounter (Signed)
Patient is requesting a call back regarding event monitor order.

## 2023-04-27 NOTE — Transitions of Care (Post Inpatient/ED Visit) (Signed)
04/27/2023  Name: Autumn Walker MRN: [id number] DOB: 04-15-1951  Today's TOC FU Call Status: Today's TOC FU Call Status:: Successful TOC FU Call Completed Patient's Name and Date of Birth confirmed.  Transition Care Management Follow-up Telephone Call Date of Discharge: 04/26/23 Discharge Facility: [facility]) Type of Discharge: Inpatient Admission Primary Inpatient Discharge Diagnosis:: Acute CVA How have you been since you were released from the hospital?: Better (Patient notes she is doing well, feeling almost back to normal) Any questions or concerns?: No  Items Reviewed: Did you receive and understand the discharge instructions provided?: Yes Medications obtained,verified, and reconciled?: Yes (Medications Reviewed) Any new allergies since your discharge?: No Dietary orders reviewed?: Yes Type of Diet Ordered:: Low sodium, carb modified Do you have support at home?: Yes People in Home: spouse Name of Support/Comfort Primary Source: Autumn Walker  Medications Reviewed Today: Medications Reviewed Today     Reviewed by [name], RN (Case Manager) on 04/27/23 at 1455  Med List Status: <None>   Medication Order Taking? Sig Documenting Provider Last Dose Status Informant  acetaminophen (TYLENOL) 325 MG tablet [id number] Yes Take 2 tablets (650 mg total) by mouth every 6 (six) hours as needed (Pain). [name], MD Taking Active   albuterol (VENTOLIN HFA) 108 (90 Base) MCG/ACT inhaler [id number] Yes INHALE 1-2 PUFFS INTO THE LUNGS EVERY 4 (FOUR) HOURS AS NEEDED FOR WHEEZING OR SHORTNESS OF BREATH. Mayer Masker, PA-C Taking Active Self, Child, Multiple Informants, Pharmacy Records  aspirin 81 MG chewable tablet [id number] Yes Chew 1 tablet (81 mg total) by mouth daily. [name], MD Taking Active   calcium carbonate (TUMS EX) 750 MG chewable tablet [id number] Yes Chew 2 tablets by mouth daily as needed for heartburn (Indigestion). [provider]  Taking Active Self, Child, Multiple Informants, Pharmacy Records  clopidogrel (PLAVIX) 75 MG tablet [id number] Yes Take 1 tablet (75 mg total) by mouth daily. [name], MD Taking Active   ezetimibe (ZETIA) 10 MG tablet [id number] Yes TAKE 1 TABLET (10 MG TOTAL) BY MOUTH DAILY AFTER SUPPER. [name], PA Taking Active Self, Child, Multiple Informants, Pharmacy Records           Med Note [name] Apr 24, 2023  7:45 PM) Patient just switched due to allergy to statins.  fluticasone (FLONASE) 50 MCG/ACT nasal spray [id number] Yes USE 1 SPRAY IN EACH NOSTRIL TWICE DAILY FOLLOWING SINUS RINSES [name], [name], DO Taking Active Self, Child, Multiple Informants, Pharmacy Records           Med Note Autumn Walker   Mon Apr 24, 2023  7:46 PM) Seasonal allergies per patient  gabapentin (NEURONTIN) 300 MG capsule [id number] Yes Take 2 capsules (600 mg total) by mouth at bedtime. [name], MD Taking Active   metFORMIN (GLUCOPHAGE) 1000 MG tablet [id number] Yes Take 1 tablet (1,000 mg total) by mouth 2 (two) times daily with a meal. [name], PA Taking Active Self, Child, Multiple Informants, Pharmacy Records  montelukast (SINGULAIR) 10 MG tablet [id number] Yes TAKE 1 TABLET BY MOUTH EVERYDAY AT BEDTIME  Patient taking differently: Take 1 tablet by mouth daily as needed (COPD).   Mayer Masker, PA-C Taking Active Self, Child, Multiple Informants, Pharmacy Records  OVER THE COUNTER MEDICATION [id number] Yes Take 2-3 tablets by mouth every 4 (four) hours as needed (leg cramps). : Medication name-[facility]:  Place 2-3 tabs under tongue every 4 hours . If needed, place 2-3 additional tablets under the  tongue every 15 mins for up to 6 doses. [provider] Taking Active Self, Child, Multiple Informants, Pharmacy Records           Med Note Autumn Walker   Mon Apr 24, 2023  8:06 PM)    Vitamin D, Ergocalciferol, (DRISDOL) 1.25 MG (50000 UNIT) CAPS capsule  [id number] Yes Take 50,000 Units by mouth every 7 (seven) days. Take on Saturdays [provider] Taking Active Self, Child, Pharmacy Records, Multiple Informants            Home Care and Equipment/Supplies: Were Home Health Services Ordered?: No Any new equipment or medical supplies ordered?: No  Functional Questionnaire: Do you need assistance with bathing/showering or dressing?: No Do you need assistance with meal preparation?: No Do you need assistance with eating?: No Do you have difficulty maintaining continence: No Do you need assistance with getting out of bed/getting out of a chair/moving?: No Do you have difficulty managing or taking your medications?: No  Follow up appointments reviewed: PCP Follow-up appointment confirmed?: Yes Date of PCP follow-up appointment?: 05/15/23 Follow-up Provider: [name], PA Specialist Hospital Follow-up appointment confirmed?: Yes Date of Specialist follow-up appointment?: 04/11/23 Follow-Up Specialty Provider:: [name] Do you need transportation to your follow-up appointment?: No Do you understand care options if your condition(s) worsen?: Yes-patient verbalized understanding  SDOH Interventions Today    Flowsheet Row Most Recent Value  SDOH Interventions   Food Insecurity Interventions Intervention Not Indicated  Housing Interventions Intervention Not Indicated  Transportation Interventions Intervention Not Indicated  Utilities Interventions Intervention Not Indicated  Health Literacy Interventions Intervention Not Indicated     [name] RN, BSN, CCM RN Care Manager  Transitions of Care  [facility]  [phone]

## 2023-05-02 DIAGNOSIS — I639 Cerebral infarction, unspecified: Secondary | ICD-10-CM

## 2023-05-04 ENCOUNTER — Other Ambulatory Visit: Payer: Medicare HMO

## 2023-05-04 DIAGNOSIS — E1169 Type 2 diabetes mellitus with other specified complication: Secondary | ICD-10-CM | POA: Diagnosis not present

## 2023-05-04 DIAGNOSIS — N1832 Chronic kidney disease, stage 3b: Secondary | ICD-10-CM | POA: Diagnosis not present

## 2023-05-04 DIAGNOSIS — E782 Mixed hyperlipidemia: Secondary | ICD-10-CM

## 2023-05-05 ENCOUNTER — Other Ambulatory Visit: Payer: Self-pay | Admitting: Family Medicine

## 2023-05-05 DIAGNOSIS — E875 Hyperkalemia: Secondary | ICD-10-CM

## 2023-05-05 LAB — CBC WITH DIFFERENTIAL/PLATELET
Basophils Absolute: 0.2 10*3/uL (ref 0.0–0.2)
Basos: 1 %
EOS (ABSOLUTE): 0.6 10*3/uL — ABNORMAL HIGH (ref 0.0–0.4)
Eos: 5 %
Hematocrit: 41.2 % (ref 34.0–46.6)
Hemoglobin: 13.8 g/dL (ref 11.1–15.9)
Immature Grans (Abs): 0 10*3/uL (ref 0.0–0.1)
Immature Granulocytes: 0 %
Lymphocytes Absolute: 6 10*3/uL — ABNORMAL HIGH (ref 0.7–3.1)
Lymphs: 52 %
MCH: 33.2 pg — ABNORMAL HIGH (ref 26.6–33.0)
MCHC: 33.5 g/dL (ref 31.5–35.7)
MCV: 99 fL — ABNORMAL HIGH (ref 79–97)
Monocytes Absolute: 0.9 10*3/uL (ref 0.1–0.9)
Monocytes: 8 %
Neutrophils Absolute: 3.9 10*3/uL (ref 1.4–7.0)
Neutrophils: 34 %
Platelets: 386 10*3/uL (ref 150–450)
RBC: 4.16 x10E6/uL (ref 3.77–5.28)
RDW: 12.4 % (ref 11.7–15.4)
WBC: 11.6 10*3/uL — ABNORMAL HIGH (ref 3.4–10.8)

## 2023-05-05 LAB — LIPID PANEL
Chol/HDL Ratio: 2.3 ratio (ref 0.0–4.4)
Cholesterol, Total: 93 mg/dL — ABNORMAL LOW (ref 100–199)
HDL: 41 mg/dL (ref >39)
LDL Chol Calc (NIH): 34 mg/dL (ref 0–99)
Triglycerides: 91 mg/dL (ref 0–149)
VLDL Cholesterol Cal: 18 mg/dL (ref 5–40)

## 2023-05-05 LAB — COMPREHENSIVE METABOLIC PANEL
ALT: 9 [IU]/L (ref 0–32)
AST: 16 [IU]/L (ref 0–40)
Albumin: 4.4 g/dL (ref 3.8–4.8)
Alkaline Phosphatase: 49 [IU]/L (ref 44–121)
BUN/Creatinine Ratio: 14 (ref 12–28)
BUN: 13 mg/dL (ref 8–27)
Bilirubin Total: <0.2 mg/dL (ref 0.0–1.2)
CO2: 21 mmol/L (ref 20–29)
Calcium: 9.4 mg/dL (ref 8.7–10.3)
Chloride: 104 mmol/L (ref 96–106)
Creatinine, Ser: 0.94 mg/dL (ref 0.57–1.00)
Globulin, Total: 2.6 g/dL (ref 1.5–4.5)
Glucose: 115 mg/dL — ABNORMAL HIGH (ref 70–99)
Potassium: 5.8 mmol/L — ABNORMAL HIGH (ref 3.5–5.2)
Sodium: 140 mmol/L (ref 134–144)
Total Protein: 7 g/dL (ref 6.0–8.5)
eGFR: 65 mL/min/{1.73_m2} (ref >59)

## 2023-05-08 ENCOUNTER — Other Ambulatory Visit: Payer: Medicare HMO

## 2023-05-08 DIAGNOSIS — E875 Hyperkalemia: Secondary | ICD-10-CM

## 2023-05-09 LAB — BASIC METABOLIC PANEL
BUN/Creatinine Ratio: 14 (ref 12–28)
BUN: 14 mg/dL (ref 8–27)
CO2: 19 mmol/L — ABNORMAL LOW (ref 20–29)
Calcium: 9.6 mg/dL (ref 8.7–10.3)
Chloride: 103 mmol/L (ref 96–106)
Creatinine, Ser: 0.98 mg/dL (ref 0.57–1.00)
Glucose: 103 mg/dL — ABNORMAL HIGH (ref 70–99)
Potassium: 5.5 mmol/L — ABNORMAL HIGH (ref 3.5–5.2)
Sodium: 141 mmol/L (ref 134–144)
eGFR: 62 mL/min/{1.73_m2} (ref >59)

## 2023-05-10 ENCOUNTER — Telehealth: Payer: Self-pay

## 2023-05-10 DIAGNOSIS — B0229 Other postherpetic nervous system involvement: Secondary | ICD-10-CM

## 2023-05-10 MED ORDER — GABAPENTIN 300 MG PO CAPS: 600.0000 mg | ORAL_CAPSULE | Freq: Every day | ORAL | 0 refills | Status: DC

## 2023-05-10 NOTE — Telephone Encounter (Signed)
Prescription Request  05/10/2023  LOV: 01/13/23  What is the name of the medication or equipment? gabapentin (NEURONTIN) 300 MG capsule   Have you contacted your pharmacy to request a refill? No   Which pharmacy would you like this sent to?  [id number] [place], [place] - [address] [place] [address] Phone: [phone] Fax: [phone]   Patient notified that their request is being sent to the clinical staff for review and that they should receive a response within 2 business days.   Please advise at Mobile [phone] (mobile)

## 2023-05-10 NOTE — Addendum Note (Signed)
Addended by: Tonny Bollman on: 05/10/2023 03:34 PM   Modules accepted: Orders

## 2023-05-10 NOTE — Telephone Encounter (Signed)
She should keep checking her blood pressure at home, but for right now I agree with continuing to stay off of it since her potassium level is already high and it would make the potassium level increase further.

## 2023-05-10 NOTE — Telephone Encounter (Signed)
Pt is also concerned about being taken off of her Losartan while at the [facility].

## 2023-05-10 NOTE — Telephone Encounter (Signed)
Refill sent.

## 2023-05-11 ENCOUNTER — Other Ambulatory Visit: Payer: Medicare HMO
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
## 2023-05-11 NOTE — Telephone Encounter (Signed)
Please call patient to explain why her Rx was decreased to 2 a day. Pt called with questions concerning this
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
## 2023-05-12 ENCOUNTER — Ambulatory Visit: Payer: Medicare HMO | Attending: Cardiovascular Disease | Admitting: Cardiovascular Disease

## 2023-05-12 ENCOUNTER — Encounter: Payer: Self-pay | Admitting: Cardiovascular Disease

## 2023-05-12 VITALS — BP 110/70 | HR 88 | Ht 61.0 in | Wt 125.0 lb

## 2023-05-12 DIAGNOSIS — Z01810 Encounter for preprocedural cardiovascular examination: Secondary | ICD-10-CM | POA: Diagnosis not present

## 2023-05-12 DIAGNOSIS — I350 Nonrheumatic aortic (valve) stenosis: Secondary | ICD-10-CM

## 2023-05-12 NOTE — Telephone Encounter (Signed)
It looks like at the hospital it was reported as only being 2 capsules at bedtime and that is where they left it.  We can talk about it more at her upcoming appointment.

## 2023-05-12 NOTE — Patient Instructions (Signed)
Lab Work: CBC, BMET today  If you have labs (blood work) drawn today and your tests are completely normal, you will receive your results only by: MyChart Message (if you have MyChart) OR A paper copy in the mail If you have any lab test that is abnormal or we need to change your treatment, we will call you to review the results.  Testing/Procedures: L Heart catheterization Your physician has requested that you have a cardiac catheterization. Cardiac catheterization is used to diagnose and/or treat various heart conditions. Doctors may recommend this procedure for a number of different reasons. The most common reason is to evaluate chest pain. Chest pain can be a symptom of coronary artery disease (CAD), and cardiac catheterization can show whether plaque is narrowing or blocking your heart's arteries. This procedure is also used to evaluate the valves, as well as measure the blood flow and oxygen levels in different parts of your heart. For further information please visit [email or web address] Please follow instruction sheet, as given.  TAVR CT's and surgical appointment (you will be called to schedule)  Follow-Up: At [facility], you and your health needs are our priority.  As part of our continuing mission to provide you with exceptional heart care, we have created designated Provider Care Teams.  These Care Teams include your primary Cardiologist (physician) and Advanced Practice Providers (APPs -  Physician Assistants and Nurse Practitioners) who all work together to provide you with the care you need, when you need it.  Your next appointment:   Structural Team will follow-up  Provider:   Tonny Bollman, MD      Other Instructions       Cardiac/Peripheral Catheterization  You are scheduled for a Cardiac Catheterization on Wednesday, November 13 with Dr. Tonny Bollman.  1. Please arrive at the [facility] (Main Entrance A) at [facility]: [address]  [place], [place] [address] at 6:30 AM (This time is two hour(s) before your procedure to ensure your preparation). Free valet parking service is available. You will check in at ADMITTING. The support person will be asked to wait in the waiting room.  It is OK to have someone drop you off and come back when you are ready to be discharged.        Special note: Every effort is made to have your procedure done on time. Please understand that emergencies sometimes delay scheduled procedures.  2. Diet: Do not eat solid foods after midnight.  You may have clear liquids until 5 AM the day of the procedure.  3. Labs: Today  4. Medication instructions in preparation for your procedure:   Contrast Allergy: No  DO NOT TAKE Metformin the day of procedure and HOLD for two days afterwards (last dose Tuesday, resume Saturday)  On the morning of your procedure, take Aspirin 81 mg and Plavix/Clopidogrel and any morning medicines NOT listed above.  You may use sips of water.  5. Plan to go home the same day, you will only stay overnight if medically necessary. 6. You MUST have a responsible adult to drive you home. 7. An adult MUST be with you the first 24 hours after you arrive home. 8. Bring a current list of your medications, and the last time and date medication taken. 9. Bring ID and current insurance cards. 10.Please wear clothes that are easy to get on and off and wear slip-on shoes.  Thank you for allowing [place] to care for you!   -- [place] [facility]

## 2023-05-13 LAB — CBC
Hematocrit: 41.7 % (ref 34.0–46.6)
Hemoglobin: 14 g/dL (ref 11.1–15.9)
MCH: 33.2 pg — ABNORMAL HIGH (ref 26.6–33.0)
MCHC: 33.6 g/dL (ref 31.5–35.7)
MCV: 99 fL — ABNORMAL HIGH (ref 79–97)
Platelets: 362 10*3/uL (ref 150–450)
RBC: 4.22 x10E6/uL (ref 3.77–5.28)
RDW: 12.5 % (ref 11.7–15.4)
WBC: 14.5 10*3/uL — ABNORMAL HIGH (ref 3.4–10.8)

## 2023-05-13 LAB — BASIC METABOLIC PANEL
BUN/Creatinine Ratio: 20 (ref 12–28)
BUN: 18 mg/dL (ref 8–27)
CO2: 23 mmol/L (ref 20–29)
Calcium: 9.9 mg/dL (ref 8.7–10.3)
Chloride: 100 mmol/L (ref 96–106)
Creatinine, Ser: 0.9 mg/dL (ref 0.57–1.00)
Glucose: 123 mg/dL — ABNORMAL HIGH (ref 70–99)
Potassium: 4.6 mmol/L (ref 3.5–5.2)
Sodium: 138 mmol/L (ref 134–144)
eGFR: 68 mL/min/{1.73_m2} (ref >59)

## 2023-05-15 ENCOUNTER — Encounter: Payer: Self-pay | Admitting: Cardiovascular Disease

## 2023-05-15 ENCOUNTER — Encounter: Payer: Self-pay | Admitting: Family Medicine

## 2023-05-15 ENCOUNTER — Ambulatory Visit (INDEPENDENT_AMBULATORY_CARE_PROVIDER_SITE_OTHER): Payer: Medicare HMO | Admitting: Family Medicine

## 2023-05-15 ENCOUNTER — Telehealth: Payer: Self-pay

## 2023-05-15 VITALS — BP 115/68 | HR 96 | Resp 18 | Ht 61.0 in | Wt 125.0 lb

## 2023-05-15 DIAGNOSIS — F17200 Nicotine dependence, unspecified, uncomplicated: Secondary | ICD-10-CM | POA: Diagnosis not present

## 2023-05-15 DIAGNOSIS — T466X5A Adverse effect of antihyperlipidemic and antiarteriosclerotic drugs, initial encounter: Secondary | ICD-10-CM | POA: Diagnosis not present

## 2023-05-15 DIAGNOSIS — I63512 Cerebral infarction due to unspecified occlusion or stenosis of left middle cerebral artery: Secondary | ICD-10-CM | POA: Diagnosis not present

## 2023-05-15 DIAGNOSIS — E1169 Type 2 diabetes mellitus with other specified complication: Secondary | ICD-10-CM

## 2023-05-15 DIAGNOSIS — Z7984 Long term (current) use of oral hypoglycemic drugs: Secondary | ICD-10-CM

## 2023-05-15 DIAGNOSIS — G720 Drug-induced myopathy: Secondary | ICD-10-CM | POA: Insufficient documentation

## 2023-05-15 DIAGNOSIS — N1832 Chronic kidney disease, stage 3b: Secondary | ICD-10-CM

## 2023-05-15 DIAGNOSIS — B0229 Other postherpetic nervous system involvement: Secondary | ICD-10-CM | POA: Diagnosis not present

## 2023-05-15 DIAGNOSIS — E782 Mixed hyperlipidemia: Secondary | ICD-10-CM

## 2023-05-15 DIAGNOSIS — D72829 Elevated white blood cell count, unspecified: Secondary | ICD-10-CM

## 2023-05-15 MED ORDER — GABAPENTIN 300 MG PO CAPS: ORAL_CAPSULE | ORAL | 1 refills | Status: AC

## 2023-05-15 NOTE — Telephone Encounter (Signed)
   Pre-operative Risk Assessment    Patient Name: Autumn Walker  DOB: 26-Jan-1951 MRN: [id number]     Request for Surgical Clearance    Procedure:  17 Extractions   Date of Surgery:  Clearance TBD                                 Surgeon:  Dr. Juanetta Beets  Surgeon's Group or Practice Name:  [facility]  Phone number:  [phone] Fax number:  [phone]   Type of Clearance Requested:   - Pharmacy:  Hold Aspirin     Type of Anesthesia:  Local    Additional requests/questions:    SignedVernard Gambles   05/15/2023, 5:24 PM

## 2023-05-15 NOTE — Assessment & Plan Note (Signed)
Losartan discontinued in hospital due to soft blood pressures.  In the future, discussed switching metformin to SGLT2 or GLP-1 for both diabetes and kidney protection.  Recheck CMP with next labs as well.

## 2023-05-15 NOTE — Telephone Encounter (Signed)
Error

## 2023-05-15 NOTE — Progress Notes (Signed)
Established Patient Office Visit  Subjective   Patient ID: Blanka Rockholt, female    DOB: [birth year]  Age: 72 y.o. MRN: [id number]  Chief Complaint  Patient presents with   Hospitalization Follow-up    HPI Chandelle Harkey is a 72 y.o. female presenting today for follow up of hospitalization from 04/24/2023 through 04/26/2023. She was watching movie at 4 pm on day PTA and suddenly was unable to comprehend speech.  She also reported expressive aphasia, she was unable to tell her husband what groceries she wanted from the store.  Her symptoms continued until approximately 10 PM when suddenly they abated. She felt mostly back to her normal and could comprehend and express herself better.  On morning of ED visit, remained near her baseline, but she again developed similar symptoms of expressive aphasia and presented to ED.  CT head showed hypodensity in the left insular ribbon and left anterior temporal lobe.  Patient was admitted for further evaluation.  MRI showed acute left MCA territory cortical/subcortical infarct affecting portions of the anterosuperior left temporal lobe and left insula. 2D echo shows severe aortic stenosis.  Neurology recommended 30-day cardiac event monitoring outpatient to rule out atrial fibrillation.  Started on aspirin 81 mg daily + Plavix 75 Mg daily for 3 months and then aspirin alone.  Recommendations for outpatient follow-up with neurology and cardiology and smoking cessation.  Upcoming neurology appointment on 05/23/2023, left heart catheterization scheduled for 05/24/2023.  Losartan was discontinued given ongoing soft blood pressures while in the hospital.  She would like to ensure that we are still protecting her kidneys even with the losartan discontinued. Labs on 05/04/2023 revealed WBC 11.6 and potassium 5.8.  On repeat on 05/12/2023, potassium normalized to 4.6, but WBC further increased to 14.5.  Upcoming dental procedure to remove tooth abscesses is  scheduled for later this week or early next week.  Outpatient Medications Prior to Visit  Medication Sig   acetaminophen (TYLENOL) 325 MG tablet Take 2 tablets (650 mg total) by mouth every 6 (six) hours as needed (Pain).   albuterol (VENTOLIN HFA) 108 (90 Base) MCG/ACT inhaler INHALE 1-2 PUFFS INTO THE LUNGS EVERY 4 (FOUR) HOURS AS NEEDED FOR WHEEZING OR SHORTNESS OF BREATH.   aspirin 81 MG chewable tablet Chew 1 tablet (81 mg total) by mouth daily.   calcium carbonate (TUMS EX) 750 MG chewable tablet Chew 2 tablets by mouth daily as needed for heartburn (Indigestion).   clopidogrel (PLAVIX) 75 MG tablet Take 1 tablet (75 mg total) by mouth daily.   ezetimibe (ZETIA) 10 MG tablet TAKE 1 TABLET (10 MG TOTAL) BY MOUTH DAILY AFTER SUPPER.   fluticasone (FLONASE) 50 MCG/ACT nasal spray USE 1 SPRAY IN EACH NOSTRIL TWICE DAILY FOLLOWING SINUS RINSES (Patient taking differently: as needed. USE 1 SPRAY IN EACH NOSTRIL TWICE DAILY FOLLOWING SINUS RINSES)   metFORMIN (GLUCOPHAGE) 1000 MG tablet Take 1 tablet (1,000 mg total) by mouth 2 (two) times daily with a meal.   montelukast (SINGULAIR) 10 MG tablet TAKE 1 TABLET BY MOUTH EVERYDAY AT BEDTIME (Patient taking differently: Take 1 tablet by mouth daily as needed (COPD).)   OVER THE COUNTER MEDICATION Take 2-3 tablets by mouth every 4 (four) hours as needed (leg cramps). : Medication name-Hyland's Homeopathic:  Place 2-3 tabs under tongue every 4 hours . If needed, place 2-3 additional tablets under the tongue every 15 mins for up to 6 doses.   Vitamin D, Ergocalciferol, (DRISDOL) 1.25 MG (50000 UNIT) CAPS capsule  Take 50,000 Units by mouth every 7 (seven) days. Take on Saturdays   [DISCONTINUED] gabapentin (NEURONTIN) 300 MG capsule Take 2 capsules (600 mg total) by mouth at bedtime.   No facility-administered medications prior to visit.    ROS Negative unless otherwise noted in HPI   Objective:     BP 115/68 (BP Location: Left Arm, Patient  Position: Sitting, Cuff Size: Normal)   Pulse 96   Resp 18   Ht 5\' 1"  (1.549 m)   Wt 125 lb (56.7 kg)   SpO2 97%   BMI 23.62 kg/m   Physical Exam Constitutional:      General: She is not in acute distress.    Appearance: Normal appearance.  HENT:     Head: Normocephalic and atraumatic.  Cardiovascular:     Rate and Rhythm: Normal rate and regular rhythm.     Heart sounds: Murmur heard.     No friction rub. No gallop.  Pulmonary:     Effort: Pulmonary effort is normal. No respiratory distress.     Breath sounds: No wheezing, rhonchi or rales.  Skin:    General: Skin is warm and dry.  Neurological:     Mental Status: She is alert and oriented to person, place, and time.     Assessment & Plan:  Acute ischemic left MCA stroke [facility]) Assessment & Plan: Continue follow-up with neurology and cardiology as scheduled.  Continue aspirin 81 mg daily + Plavix 75 Mg daily for 3 months (until 08/15/2023) and then switch to aspirin alone.  Continue to work on mitigating risk factors like smoking and hyperlipidemia.   PHN (postherpetic neuralgia) Assessment & Plan: Stable.  Continue gabapentin 300 mg each morning, 300 mg each afternoon, and 600 mg at bedtime.  Orders: -     Gabapentin; Take 1 capsule (300 mg total) by mouth in the morning AND 1 capsule (300 mg total) daily in the afternoon AND 2 capsules (600 mg total) at bedtime.  Dispense: 360 capsule; Refill: 1  Statin myopathy Assessment & Plan: Continue Zetia 10 mg daily and low-fat diet.   Dyslipidemia with low high density lipoprotein (HDL) cholesterol with hypertriglyceridemia due to type 2 diabetes mellitus (HCC) Assessment & Plan: Last lipid panel: LDL 31, HDL 41, triglycerides 98.  History of statin myopathy, continue Zetia 10 mg daily.  May consider referral to lipid clinic in the future if needed.   Tobacco use disorder Assessment & Plan: Continue smoking cessation efforts, in the future can add on medication support or  CBT.   Stage 3b chronic kidney disease (HCC) Assessment & Plan: Losartan discontinued in hospital due to soft blood pressures.  In the future, discussed switching metformin to SGLT2 or GLP-1 for both diabetes and kidney protection.  Recheck CMP with next labs as well.   Will update influenza vaccine and mammogram at next visit.  Return in about 6 weeks (around 06/26/2023) for follow-up for diabetes medicine and kidney protection, labs 1 week before.   I spent 55 minutes on the day of the encounter to include pre-visit record review of hospital notes and diagnostics, face-to-face time with the patient, and post visit ordering of tests and coordination of care.  [name], PA

## 2023-05-15 NOTE — Assessment & Plan Note (Signed)
Last lipid panel: LDL 31, HDL 41, triglycerides 98.  History of statin myopathy, continue Zetia 10 mg daily.  May consider referral to lipid clinic in the future if needed.

## 2023-05-15 NOTE — Assessment & Plan Note (Signed)
Continue smoking cessation efforts, in the future can add on medication support or CBT.

## 2023-05-15 NOTE — Assessment & Plan Note (Signed)
Continue follow-up with neurology and cardiology as scheduled.  Continue aspirin 81 mg daily + Plavix 75 Mg daily for 3 months (until 08/15/2023) and then switch to aspirin alone.  Continue to work on mitigating risk factors like smoking and hyperlipidemia.

## 2023-05-15 NOTE — Patient Instructions (Signed)
You will not need to be fasting for the labs in about 6 weeks.  The gabapentin is safe to take without worrying about your heart or kidneys.  I have sent in a corrected prescription for you.  I was not able to find anything about the CT that cardiology mentioned to you.  Call your dentist to ask if they recommend that you start an antibiotic prior to next week.

## 2023-05-15 NOTE — Assessment & Plan Note (Signed)
Continue Zetia 10 mg daily and low-fat diet.

## 2023-05-15 NOTE — Assessment & Plan Note (Signed)
Stable.  Continue gabapentin 300 mg each morning, 300 mg each afternoon, and 600 mg at bedtime.

## 2023-05-16 ENCOUNTER — Ambulatory Visit: Payer: Medicare HMO | Admitting: Family Medicine

## 2023-05-16 NOTE — Telephone Encounter (Signed)
Left a message for the DDS office if extractions are simple or surgical. A duplicate request was received today. Asked for a call back to confirm the above.   Pt is also scheduled for heart cath 05/24/23.

## 2023-05-17 NOTE — Telephone Encounter (Signed)
Patient is calling for an update. Advise patient we waiting to hear back for dental office. She would like a call back once decision is made. Please advise

## 2023-05-18 NOTE — Telephone Encounter (Signed)
Callback team please contact patient and advise that per [name] he recommended that she wait until after her right and left heart catheterization to proceed with dental extractions.  Please also update requesting provider.  Thanks, Alden Server

## 2023-05-18 NOTE — Telephone Encounter (Signed)
Patient called to follow-up on dental clearance.

## 2023-05-18 NOTE — Telephone Encounter (Signed)
Patient and requesting office made aware that patient will need to have heart cath done prior to dental work

## 2023-05-18 NOTE — Telephone Encounter (Signed)
Spoke with requesting office and they stated that the extractions are surgical

## 2023-05-22 NOTE — Progress Notes (Unsigned)
[facility] [address] [place]. [place] [address] [phone]       HOSPITAL FOLLOW UP NOTE  [name] Date of Birth:  01-14-1951 Medical Record Number:  [id number]   Reason for Referral:  hospital stroke follow up    SUBJECTIVE:   CHIEF COMPLAINT:  No chief complaint on file.   HPI:   [name] is a 72 y.o. female with history of diabetes, tobacco abuse, hyperlipidemia, cataracts presenting on 04/24/2023 with >24 hours intermittent word-finding difficulty and agnosia that began the previous day, symptoms improved but worsened on 10/14 with worsening aphasia.  Stroke workup revealed left temporal infarct due to left M2 high-grade stenosis vs short segmental occlusion, etiology likely large vessel disease given multiple risk factors but could not completely rule out cardioembolic source.  Recommended cardiac event monitor outpatient to rule out A-fib.  LDL 34.  A1c 6.1.  Recommended DAPT for 3 months and aspirin alone and continued Zetia 10 mg daily.  Current tobacco use with smoking cessation counseling provided.  Discharged home in stable condition without therapy needs.        PERTINENT IMAGING  Code Stroke - CT head Hypodensity in the anterior left temporal lobe, with some loss of the left insular ribbon, concerning for acute infarct. ASPECTS is 8. CTA head & neck 10/14 Occlusion of the left M2 insular branch, with reconstitution and intermittent poor perfusion of the more distal left M2.  CTP 0/9. MRI BRAIN  Acute left MCA territory cortical/subcortical infarct affecting portions of the anterosuperior left temporal lobe and left insula, as described. 2D Echo  Pending  Recommend 30 day cardiac event monitoring as outpt to rule out afib LDL 34 HgbA1c 6.1    ROS:   14 system review of systems performed and negative with exception of ***  PMH:  Past Medical History:  Diagnosis Date   Allergy    Blood  transfusion without reported diagnosis    Cataract    Diabetes mellitus without complication (HCC)    Heart murmur    Stroke (HCC)     PSH:  Past Surgical History:  Procedure Laterality Date   APPENDECTOMY     CHOLECYSTECTOMY     PANCREATECTOMY     SPLENECTOMY, TOTAL      Social History:  Social History   Socioeconomic History   Marital status: Married    Spouse name: Not on file   Number of children: Not on file   Years of education: Not on file   Highest education level: Not on file  Occupational History   Not on file  Tobacco Use   Smoking status: Some Days    Current packs/day: 0.00    Average packs/day: 0.5 packs/day for 40.0 years (20.0 ttl pk-yrs)    Types: Cigarettes    Passive exposure: Past   Smokeless tobacco: Never   Tobacco comments:    2-3 cigs weekly   Vaping Use   Vaping status: Never Used  Substance and Sexual Activity   Alcohol use: No   Drug use: Never   Sexual activity: Not Currently    Birth control/protection: Post-menopausal  Other Topics Concern   Not on file  Social History Narrative   Not on file   Social Determinants of Health   Financial Resource Strain: Low Risk  (01/17/2023)   Overall Financial Resource Strain (CARDIA)    Difficulty of Paying Living Expenses: Not hard at all  Food Insecurity: No Food Insecurity (04/27/2023)  Hunger Vital Sign    Worried About Running Out of Food in the Last Year: Never true    Ran Out of Food in the Last Year: Never true  Transportation Needs: No Transportation Needs (04/27/2023)   PRAPARE - Administrator, Civil Service (Medical): No    Lack of Transportation (Non-Medical): No  Physical Activity: Insufficiently Active (01/17/2023)   Exercise Vital Sign    Days of Exercise per Week: 1 day    Minutes of Exercise per Session: 30 min  Stress: No Stress Concern Present (01/17/2023)   [employer] of Occupational Health - Occupational Stress Questionnaire    Feeling of Stress : Not  at all  Social Connections: Moderately Isolated (01/17/2023)   Social Connection and Isolation Panel [NHANES]    Frequency of Communication with Friends and Family: More than three times a week    Frequency of Social Gatherings with Friends and Family: More than three times a week    Attends Religious Services: Never    Database administrator or Organizations: No    Attends Banker Meetings: Never    Marital Status: Married  Catering manager Violence: Not At Risk (04/27/2023)   Humiliation, Afraid, Rape, and Kick questionnaire    Fear of Current or Ex-Partner: No    Emotionally Abused: No    Physically Abused: No    Sexually Abused: No    Family History:  Family History  Problem Relation Age of Onset   Thyroid disease Mother    Varicose Veins Mother    Heart disease Father    Alcohol abuse Father    Heart attack Father    Hyperlipidemia Father    COPD Father    Depression Sister    Thyroid disease Sister    Anxiety disorder Sister    Miscarriages / [place] Sister    Thyroid disease Daughter    Diabetes Maternal Grandmother    Heart disease Maternal Grandfather    Cancer Paternal Grandfather        prostate   Hypertension Paternal Grandfather    Alcohol abuse Brother    Colon cancer Neg Hx    Breast cancer Neg Hx     Medications:   Current Outpatient Medications on File Prior to Visit  Medication Sig Dispense Refill   acetaminophen (TYLENOL) 325 MG tablet Take 2 tablets (650 mg total) by mouth every 6 (six) hours as needed (Pain).     albuterol (VENTOLIN HFA) 108 (90 Base) MCG/ACT inhaler INHALE 1-2 PUFFS INTO THE LUNGS EVERY 4 (FOUR) HOURS AS NEEDED FOR WHEEZING OR SHORTNESS OF BREATH. 18 each 0   aspirin 81 MG chewable tablet Chew 1 tablet (81 mg total) by mouth daily. 90 tablet 0   calcium carbonate (TUMS EX) 750 MG chewable tablet Chew 2 tablets by mouth daily as needed for heartburn (Indigestion).     clopidogrel (PLAVIX) 75 MG tablet Take 1 tablet  (75 mg total) by mouth daily. 88 tablet 0   ezetimibe (ZETIA) 10 MG tablet TAKE 1 TABLET (10 MG TOTAL) BY MOUTH DAILY AFTER SUPPER. 90 tablet 0   fluticasone (FLONASE) 50 MCG/ACT nasal spray USE 1 SPRAY IN EACH NOSTRIL TWICE DAILY FOLLOWING SINUS RINSES (Patient taking differently: as needed. USE 1 SPRAY IN EACH NOSTRIL TWICE DAILY FOLLOWING SINUS RINSES) 48 mL 0   gabapentin (NEURONTIN) 300 MG capsule Take 1 capsule (300 mg total) by mouth in the morning AND 1 capsule (300 mg total) daily in the afternoon  AND 2 capsules (600 mg total) at bedtime. 360 capsule 1   metFORMIN (GLUCOPHAGE) 1000 MG tablet Take 1 tablet (1,000 mg total) by mouth 2 (two) times daily with a meal. 180 tablet 1   montelukast (SINGULAIR) 10 MG tablet TAKE 1 TABLET BY MOUTH EVERYDAY AT BEDTIME (Patient taking differently: Take 1 tablet by mouth daily as needed (COPD).) 90 tablet 1   OVER THE COUNTER MEDICATION Take 2-3 tablets by mouth every 4 (four) hours as needed (leg cramps). : Medication name-Hyland's Homeopathic:  Place 2-3 tabs under tongue every 4 hours . If needed, place 2-3 additional tablets under the tongue every 15 mins for up to 6 doses.     Vitamin D, Ergocalciferol, (DRISDOL) 1.25 MG (50000 UNIT) CAPS capsule Take 50,000 Units by mouth every 7 (seven) days. Take on Saturdays     No current facility-administered medications on file prior to visit.    Allergies:   Allergies  Allergen Reactions   Statins     Severe leg cramps   Codeine Nausea Only      OBJECTIVE:  Physical Exam  There were no vitals filed for this visit. There is no height or weight on file to calculate BMI. No results found.     01/17/2023    8:43 AM  Depression screen PHQ 2/9  Decreased Interest 0  Down, Depressed, Hopeless 0  PHQ - 2 Score 0     General: well developed, well nourished, seated, in no evident distress Head: head normocephalic and atraumatic.   Neck: supple with no carotid or supraclavicular  bruits Cardiovascular: regular rate and rhythm, no murmurs Musculoskeletal: no deformity Skin:  no rash/petichiae Vascular:  Normal pulses all extremities   Neurologic Exam Mental Status: Awake and fully alert. Oriented to place and time. Recent and remote memory intact. Attention span, concentration and fund of knowledge appropriate. Mood and affect appropriate.  Cranial Nerves: Fundoscopic exam reveals sharp disc margins. Pupils equal, briskly reactive to light. Extraocular movements full without nystagmus. Visual fields full to confrontation. Hearing intact. Facial sensation intact. Face, tongue, palate moves normally and symmetrically.  Motor: Normal bulk and tone. Normal strength in all tested extremity muscles Sensory.: intact to touch , pinprick , position and vibratory sensation.  Coordination: Rapid alternating movements normal in all extremities. Finger-to-nose and heel-to-shin performed accurately bilaterally. Gait and Station: Arises from chair without difficulty. Stance is normal. Gait demonstrates normal stride length and balance with ***. Tandem walk and heel toe ***.  Reflexes: 1+ and symmetric. Toes downgoing.     NIHSS  *** Modified Rankin  ***      ASSESSMENT: Autumn Walker is a 72 y.o. year old female with left temporal infarct on 04/24/2023 due to left M2 high-grade stenosis vs short segmental occlusion, etiology likely large vessel disease given multiple risk factors but unable to completely rule out cardioembolic source. Vascular risk factors include HTN, HLD, DM, tobacco use, and advanced age.      PLAN:  Left temporal stroke:  Residual deficit: ***.  Cardiac monitor *** Continue aspirin 81mg  daily and Plavix for additional 2 months then aspirin alone and continue Zetia 10 mg daily for secondary stroke prevention.   Discussed secondary stroke prevention measures and importance of close PCP follow up for aggressive stroke risk factor management  including BP goal<130/90, HLD with LDL goal<70 and DM with A1c.<7 .  Stroke labs 04/2023: LDL 34, A1c 6.1 I have gone over the pathophysiology of stroke, warning signs and symptoms, risk  factors and their management in some detail with instructions to go to the closest emergency room for symptoms of concern.     Follow up in *** or call earlier if needed   CC:  [facility] provider: Dr. Pearlean Brownie PCP: [name], PA    I spent *** minutes of face-to-face and non-face-to-face time with patient.  This included previsit chart review including review of recent hospitalization, lab review, study review, order entry, electronic health record documentation, patient education regarding recent stroke including etiology, secondary stroke prevention measures and importance of managing stroke risk factors, residual deficits and typical recovery time and answered all other questions to patient satisfaction   Ihor Austin, AGNP-BC  [facility] [address] [place], [place] [address]  Phone [phone] Fax [phone] Note: This document was prepared with digital dictation and possible smart phrase technology. Any transcriptional errors that result from this process are unintentional.

## 2023-05-23 ENCOUNTER — Encounter: Payer: Self-pay | Admitting: Adult Health

## 2023-05-23 ENCOUNTER — Telehealth: Payer: Self-pay | Admitting: *Deleted

## 2023-05-23 ENCOUNTER — Ambulatory Visit: Payer: Medicare HMO | Admitting: Adult Health

## 2023-05-23 VITALS — BP 102/60 | HR 106 | Ht 61.0 in | Wt 122.0 lb

## 2023-05-23 DIAGNOSIS — I63512 Cerebral infarction due to unspecified occlusion or stenosis of left middle cerebral artery: Secondary | ICD-10-CM

## 2023-05-23 NOTE — Patient Instructions (Addendum)
Continue aspirin 81 mg daily and clopidogrel 75 mg daily for additional 2 months then aspirin alone and continue Zetia 10mg  daily  for secondary stroke prevention  Complete cardiac monitor in another 2 weeks, will follow up with cardiology regarding results   Continue working on tobacco cessation as continued use greatly increases risk of additional strokes   Continue to follow up with PCP regarding cholesterol, blood pressure and diabetes management  Maintain strict control of hypertension with blood pressure goal below 130/90, diabetes with hemoglobin A1c goal below 7.0 % and cholesterol with LDL cholesterol (bad cholesterol) goal below 70 mg/dL.   Signs of a Stroke? Follow the BEFAST method:  Balance Watch for a sudden loss of balance, trouble with coordination or vertigo Eyes Is there a sudden loss of vision in one or both eyes? Or double vision?  Face: Ask the person to smile. Does one side of the face droop or is it numb?  Arms: Ask the person to raise both arms. Does one arm drift downward? Is there weakness or numbness of a leg? Speech: Ask the person to repeat a simple phrase. Does the speech sound slurred/strange? Is the person confused ? Time: If you observe any of these signs, call 911.       Thank you for coming to see [place] at [facility]. I hope we have been able to provide you high quality care today.  You may receive a patient satisfaction survey over the next few weeks. We would appreciate your feedback and comments so that we may continue to improve ourselves and the health of our patients.    Stroke Prevention Some medical conditions and lifestyle choices can lead to a higher risk for a stroke. You can help to prevent a stroke by eating healthy foods and exercising. It also helps to not smoke and to manage any health problems you may have. How can this condition affect me? A stroke is an emergency. It should be treated right away. A stroke can lead  to brain damage or threaten your life. There is a better chance of surviving and getting better after a stroke if you get medical help right away. What can increase my risk? The following medical conditions may increase your risk of a stroke: Diseases of the heart and blood vessels (cardiovascular disease). High blood pressure (hypertension). Diabetes. High cholesterol. Sickle cell disease. Problems with blood clotting. Being very overweight. Sleeping problems (obstructivesleep apnea). Other risk factors include: Being older than age 84. A history of blood clots, stroke, or mini-stroke (TIA). Race, ethnic background, or a family history of stroke. Smoking or using tobacco products. Taking birth control pills, especially if you smoke. Heavy alcohol and drug use. Not being active. What actions can I take to prevent this? Manage your health conditions High cholesterol. Eat a healthy diet. If this is not enough to manage your cholesterol, you may need to take medicines. Take medicines as told by your doctor. High blood pressure. Try to keep your blood pressure below 130/80. If your blood pressure cannot be managed through a healthy diet and regular exercise, you may need to take medicines. Take medicines as told by your doctor. Ask your doctor if you should check your blood pressure at home. Have your blood pressure checked every year. Diabetes. Eat a healthy diet and get regular exercise. If your blood sugar (glucose) cannot be managed through diet and exercise, you may need to take medicines. Take medicines as told by your doctor. Talk  to your doctor about getting checked for sleeping problems. Signs of a problem can include: Snoring a lot. Feeling very tired. Make sure that you manage any other conditions you have. Nutrition  Follow instructions from your doctor about what to eat or drink. You may be told to: Eat and drink fewer calories each day. Limit how much salt (sodium)  you use to 1,500 milligrams (mg) each day. Use only healthy fats for cooking, such as olive oil, canola oil, and sunflower oil. Eat healthy foods. To do this: Choose foods that are high in fiber. These include whole grains, and fresh fruits and vegetables. Eat at least 5 servings of fruits and vegetables a day. Try to fill one-half of your plate with fruits and vegetables at each meal. Choose low-fat (lean) proteins. These include low-fat cuts of meat, chicken without skin, fish, tofu, beans, and nuts. Eat low-fat dairy products. Avoid foods that: Are high in salt. Have saturated fat. Have trans fat. Have cholesterol. Are processed or pre-made. Count how many carbohydrates you eat and drink each day. Lifestyle If you drink alcohol: Limit how much you have to: 0-1 drink a day for women who are not pregnant. 0-2 drinks a day for men. Know how much alcohol is in your drink. In the [place] one drink equals one 12 oz bottle of beer ( ), one 5 oz glass of wine ( ), or one 1 oz glass of hard liquor (44mL). Do not smoke or use any products that have nicotine or tobacco. If you need help quitting, ask your doctor. Avoid secondhand smoke. Do not use drugs. Activity  Try to stay at a healthy weight. Get at least 30 minutes of exercise on most days, such as: Fast walking. Biking. Swimming. Medicines Take over-the-counter and prescription medicines only as told by your doctor. Avoid taking birth control pills. Talk to your doctor about the risks of taking birth control pills if: You are over 11 years old. You smoke. You get very bad headaches. You have had a blood clot. Where to find more information American Stroke Association: www.strokeassociation.org Get help right away if: You or a loved one has any signs of a stroke. "BE FAST" is an easy way to remember the warning signs: B - Balance. Dizziness, sudden trouble walking, or loss of balance. E - Eyes. Trouble seeing or a change  in how you see. F - Face. Sudden weakness or loss of feeling of the face. The face or eyelid may droop on one side. A - Arms. Weakness or loss of feeling in an arm. This happens all of a sudden and most often on one side of the body. S - Speech. Sudden trouble speaking, slurred speech, or trouble understanding what people say. T - Time. Time to call emergency services. Write down what time symptoms started. You or a loved one has other signs of a stroke, such as: A sudden, very bad headache with no known cause. Feeling like you may vomit (nausea). Vomiting. A seizure. These symptoms may be an emergency. Get help right away. Call your local emergency services (911 in the [place] Do not wait to see if the symptoms will go away. Do not drive yourself to the hospital. Summary You can help to prevent a stroke by eating healthy, exercising, and not smoking. It also helps to manage any health problems you have. Do not smoke or use any products that contain nicotine or tobacco. Get help right away if you or a loved one has any  signs of a stroke. This information is not intended to replace advice given to you by your health care provider. Make sure you discuss any questions you have with your health care provider. Document Revised: 05/30/2022 Document Reviewed: 05/30/2022 Elsevier Patient Education  2024 [employer].

## 2023-05-23 NOTE — Telephone Encounter (Signed)
Cardiac Catheterization scheduled at [facility] for: Wednesday May 24, 2023 8:30 AM Arrival time [facility] Main Entrance A at: 6:30 AM  Nothing to eat after midnight prior to procedure, clear liquids until 5 AM day of procedure.  Medication instructions: -Hold:  Metformin-day of procedure and 48 hours post procedure -Other usual morning medications can be taken with sips of water including aspirin 81 mg and Plavix 75 mg  Plan to go home the same day, you will only stay overnight if medically necessary.  You must have responsible adult to drive you home.  Someone must be with you the first 24 hours after you arrive home.  Reviewed procedure instructions with patient.

## 2023-05-23 NOTE — Progress Notes (Signed)
I agree with the above plan 

## 2023-05-24 ENCOUNTER — Encounter (HOSPITAL_COMMUNITY): Payer: Self-pay | Admitting: Cardiovascular Disease

## 2023-05-24 ENCOUNTER — Other Ambulatory Visit: Payer: Self-pay

## 2023-05-24 ENCOUNTER — Ambulatory Visit (HOSPITAL_COMMUNITY)
Admission: RE | Admit: 2023-05-24 | Discharge: 2023-05-24 | Disposition: A | Discharge status: Home / Self Care | Payer: Medicare HMO | Attending: Cardiovascular Disease | Admitting: Cardiovascular Disease

## 2023-05-24 ENCOUNTER — Ambulatory Visit (HOSPITAL_COMMUNITY)
Admission: RE | Disposition: A | Discharge status: Home / Self Care | Payer: Self-pay | Source: Home / Self Care | Attending: Cardiovascular Disease

## 2023-05-24 DIAGNOSIS — Z8673 Personal history of transient ischemic attack (TIA), and cerebral infarction without residual deficits: Secondary | ICD-10-CM | POA: Insufficient documentation

## 2023-05-24 DIAGNOSIS — R0602 Shortness of breath: Secondary | ICD-10-CM | POA: Insufficient documentation

## 2023-05-24 DIAGNOSIS — I350 Nonrheumatic aortic (valve) stenosis: Secondary | ICD-10-CM

## 2023-05-24 HISTORY — PX: LEFT HEART CATH AND CORONARY ANGIOGRAPHY: CATH118249

## 2023-05-24 LAB — GLUCOSE, CAPILLARY: Glucose-Capillary: 89 mg/dL (ref 70–99)

## 2023-05-24 SURGERY — LEFT HEART CATH AND CORONARY ANGIOGRAPHY
Anesthesia: LOCAL

## 2023-05-24 MED ORDER — SODIUM CHLORIDE 0.9 % WEIGHT BASED INFUSION
3.0000 mL/kg/h | INTRAVENOUS | Status: AC
  Administered 2023-05-24: 3 mL/kg/h via INTRAVENOUS

## 2023-05-24 MED ORDER — LIDOCAINE HCL (PF) 1 % IJ SOLN
INTRAMUSCULAR | Status: DC | PRN
  Administered 2023-05-24: 2 mL

## 2023-05-24 MED ORDER — LIDOCAINE HCL (PF) 1 % IJ SOLN
INTRAMUSCULAR | Status: AC
  Filled 2023-05-24: qty 30

## 2023-05-24 MED ORDER — SODIUM CHLORIDE 0.9% FLUSH: 3.0000 mL | INTRAVENOUS | Status: DC | PRN

## 2023-05-24 MED ORDER — SODIUM CHLORIDE 0.9 % WEIGHT BASED INFUSION: 1.0000 mL/kg/h | INTRAVENOUS | Status: DC

## 2023-05-24 MED ORDER — MIDAZOLAM HCL 2 MG/2ML IJ SOLN
INTRAMUSCULAR | Status: AC
Start: 2023-05-24 — End: ?
  Filled 2023-05-24: qty 2

## 2023-05-24 MED ORDER — VERAPAMIL HCL 2.5 MG/ML IV SOLN
INTRAVENOUS | Status: AC
  Filled 2023-05-24: qty 2

## 2023-05-24 MED ORDER — LABETALOL HCL 5 MG/ML IV SOLN: 10.0000 mg | INTRAVENOUS | Status: DC | PRN

## 2023-05-24 MED ORDER — ASPIRIN 81 MG PO CHEW: 81.0000 mg | CHEWABLE_TABLET | ORAL | Status: DC

## 2023-05-24 MED ORDER — HEPARIN SODIUM (PORCINE) 1000 UNIT/ML IJ SOLN
INTRAMUSCULAR | Status: DC | PRN
  Administered 2023-05-24: 4000 [IU] via INTRAVENOUS

## 2023-05-24 MED ORDER — ONDANSETRON HCL 4 MG/2ML IJ SOLN: 4.0000 mg | Freq: Four times a day (QID) | INTRAMUSCULAR | Status: DC | PRN

## 2023-05-24 MED ORDER — METOPROLOL TARTRATE 50 MG PO TABS: ORAL_TABLET | ORAL | 0 refills | Status: DC

## 2023-05-24 MED ORDER — SODIUM CHLORIDE 0.9% FLUSH: 3.0000 mL | Freq: Two times a day (BID) | INTRAVENOUS | Status: DC

## 2023-05-24 MED ORDER — FENTANYL CITRATE (PF) 100 MCG/2ML IJ SOLN
INTRAMUSCULAR | Status: DC | PRN
  Administered 2023-05-24: 25 ug via INTRAVENOUS

## 2023-05-24 MED ORDER — HEPARIN (PORCINE) IN NACL 1000-0.9 UT/500ML-% IV SOLN
INTRAVENOUS | Status: DC | PRN
  Administered 2023-05-24 (×2): 500 mL

## 2023-05-24 MED ORDER — ACETAMINOPHEN 325 MG PO TABS: 650.0000 mg | ORAL_TABLET | ORAL | Status: DC | PRN

## 2023-05-24 MED ORDER — MIDAZOLAM HCL 2 MG/2ML IJ SOLN
INTRAMUSCULAR | Status: DC | PRN
  Administered 2023-05-24: 1 mg via INTRAVENOUS

## 2023-05-24 MED ORDER — HEPARIN SODIUM (PORCINE) 1000 UNIT/ML IJ SOLN
INTRAMUSCULAR | Status: AC
  Filled 2023-05-24: qty 10

## 2023-05-24 MED ORDER — SODIUM CHLORIDE 0.9 % IV SOLN: 250.0000 mL | INTRAVENOUS | Status: DC | PRN

## 2023-05-24 MED ORDER — FENTANYL CITRATE (PF) 100 MCG/2ML IJ SOLN
INTRAMUSCULAR | Status: AC
  Filled 2023-05-24: qty 2

## 2023-05-24 MED ORDER — CLOPIDOGREL BISULFATE 75 MG PO TABS: 75.0000 mg | ORAL_TABLET | ORAL | Status: DC

## 2023-05-24 MED ORDER — IOHEXOL 350 MG/ML SOLN
INTRAVENOUS | Status: DC | PRN
  Administered 2023-05-24: 44 mL via INTRA_ARTERIAL

## 2023-05-24 MED ORDER — VERAPAMIL HCL 2.5 MG/ML IV SOLN
INTRAVENOUS | Status: DC | PRN
  Administered 2023-05-24: 10 mL via INTRA_ARTERIAL

## 2023-05-24 MED ORDER — HYDRALAZINE HCL 20 MG/ML IJ SOLN: 10.0000 mg | INTRAMUSCULAR | Status: DC | PRN

## 2023-05-24 SURGICAL SUPPLY — 8 items
CATH 5FR JL3.5 JR4 ANG PIG MP (CATHETERS) IMPLANT
DEVICE RAD COMP TR BAND LRG (VASCULAR PRODUCTS) IMPLANT
GLIDESHEATH SLEND SS 6F .021 (SHEATH) IMPLANT
GUIDEWIRE INQWIRE 1.5J.035X260 (WIRE) IMPLANT
INQWIRE 1.5J .035X260CM (WIRE) ×1
KIT SYRINGE INJ CVI SPIKEX1 (MISCELLANEOUS) IMPLANT
PACK CARDIAC CATHETERIZATION (CUSTOM PROCEDURE TRAY) ×1 IMPLANT
SET ATX-X65L (MISCELLANEOUS) IMPLANT

## 2023-05-24 NOTE — Progress Notes (Signed)
TR band removed at 1100, gauze dressing applied. Right radial level 0, clean, dry, and intact. Patient walked to the bathroom without difficulties.

## 2023-05-24 NOTE — Discharge Instructions (Signed)

## 2023-05-24 NOTE — Interval H&P Note (Signed)
History and Physical Interval Note:  05/24/2023 8:51 AM  Autumn Walker  has presented today for surgery, with the diagnosis of aortic stenosis.  The various methods of treatment have been discussed with the patient and family. After consideration of risks, benefits and other options for treatment, the patient has consented to  Procedure(s): LEFT HEART CATH AND CORONARY ANGIOGRAPHY (N/A) as a surgical intervention.  The patient's history has been reviewed, patient examined, no change in status, stable for surgery.  I have reviewed the patient's chart and labs.  Questions were answered to the patient's satisfaction.     Tonny Bollman

## 2023-05-25 ENCOUNTER — Telehealth: Payer: Self-pay | Admitting: Cardiovascular Disease

## 2023-05-25 MED FILL — Verapamil HCl IV Soln 2.5 MG/ML: INTRAVENOUS | Qty: 2 | Status: AC

## 2023-05-25 NOTE — Telephone Encounter (Signed)
Patient calling in asking that we send update to dental office. Please advise

## 2023-05-25 NOTE — Telephone Encounter (Signed)
Patient is calling to ask that we update the dental office on clearance. Please advise

## 2023-05-26 NOTE — Telephone Encounter (Signed)
   Patient Name: Autumn Walker  DOB: September 20, 1950 MRN: [id number]  Primary Cardiologist: None  Chart reviewed as part of pre-operative protocol coverage. Given past medical history and time since last visit, based on ACC/AHA guidelines, Autumn Walker is at acceptable risk for the planned procedure without further cardiovascular testing.   Per [name], "Pt ok to proceed with dental extraction."    From a cardiology standpoint, he may hold aspirin for 5 to 7 days prior to procedure.  However, patient is taking Aspirin primarily for history of CVA. Therefore, additional recommendations for holding Aspirin prior to surgery should come from managing provider (neurology).   I will route this recommendation to the requesting party via Epic fax function and remove from pre-op pool.  Please call with questions.  Joylene Grapes, NP 05/26/2023, 10:07 AM

## 2023-05-26 NOTE — Telephone Encounter (Signed)
Pt ok to proceed with dental extraction. Thank you.

## 2023-05-29 ENCOUNTER — Encounter: Payer: Self-pay | Admitting: Physician Assistant

## 2023-05-29 DIAGNOSIS — I350 Nonrheumatic aortic (valve) stenosis: Secondary | ICD-10-CM | POA: Insufficient documentation

## 2023-05-29 NOTE — Progress Notes (Signed)
Procedure Type: Isolated CABG Perioperative Outcome Estimate % Operative Mortality 3.99% Morbidity & Mortality 13% Stroke 3.51% Renal Failure 1.33% Reoperation 3.28% Prolonged Ventilation 8.54% Deep Sternal Wound Infection 0.218% Long Hospital Stay (>14 days) 7.74% [facility] Stay (<6 days)* 35.1%

## 2023-05-31 ENCOUNTER — Ambulatory Visit (HOSPITAL_COMMUNITY)
Admission: RE | Admit: 2023-05-31 | Discharge: 2023-05-31 | Disposition: A | Discharge status: Home / Self Care | Payer: Medicare HMO | Source: Ambulatory Visit | Attending: Cardiovascular Disease | Admitting: Cardiovascular Disease

## 2023-05-31 DIAGNOSIS — Q600 Renal agenesis, unilateral: Secondary | ICD-10-CM | POA: Diagnosis not present

## 2023-05-31 DIAGNOSIS — Z48812 Encounter for surgical aftercare following surgery on the circulatory system: Secondary | ICD-10-CM | POA: Diagnosis not present

## 2023-05-31 DIAGNOSIS — I358 Other nonrheumatic aortic valve disorders: Secondary | ICD-10-CM | POA: Diagnosis not present

## 2023-05-31 DIAGNOSIS — K7689 Other specified diseases of liver: Secondary | ICD-10-CM | POA: Diagnosis not present

## 2023-05-31 DIAGNOSIS — I350 Nonrheumatic aortic (valve) stenosis: Secondary | ICD-10-CM | POA: Insufficient documentation

## 2023-05-31 DIAGNOSIS — Z01818 Encounter for other preprocedural examination: Secondary | ICD-10-CM | POA: Diagnosis not present

## 2023-05-31 DIAGNOSIS — K429 Umbilical hernia without obstruction or gangrene: Secondary | ICD-10-CM | POA: Diagnosis not present

## 2023-05-31 MED ORDER — IOHEXOL 350 MG/ML SOLN
95.0000 mL | Freq: Once | INTRAVENOUS | Status: AC | PRN
  Administered 2023-05-31: 95 mL via INTRAVENOUS

## 2023-05-31 NOTE — Progress Notes (Signed)
Pt wearing 30 day heart monitor on arrival, states she has no adhesive left to replace if monitor needs to be removed. After speaking to [facility], RN cardiac navigator, pt will proceed with scan and call company to order more adhesives if needed. Monitor removed for scan, pt confirmed she will call provider and company for next steps.

## 2023-06-02 ENCOUNTER — Institutional Professional Consult (permissible substitution): Payer: Medicare HMO | Admitting: Thoracic Surgery (Cardiothoracic Vascular Surgery)

## 2023-06-02 VITALS — BP 122/52 | HR 110 | Resp 18 | Ht 61.0 in | Wt 120.0 lb

## 2023-06-02 DIAGNOSIS — I350 Nonrheumatic aortic (valve) stenosis: Secondary | ICD-10-CM | POA: Diagnosis not present

## 2023-06-02 NOTE — Progress Notes (Signed)
[address]       [place] [address]             [phone]           [name] [facility] Medical Record #[id number] Date of Birth: [birth year]  Tonny Bollman, MD [name], [place]  Chief Complaint:   increasing DOE  History of Present Illness:     72 yo female who was told had a murmur for many years has over the past year more SOB with activity especially when carrying groceries or laundry. She would get a "heaviness" feeling in chest with her SOB when carrying objects. She also at the end of the day would have swollen ankles. She unfortunately suffered a TIA with inability to find words and slurred speech in October and was admitted and on work up with echo was found to have severe AS with moderate AI and an EF of 60%. Her TIA symptoms resolved in a few hours. Was treated with ASA and Plavix and has seen Neurology last week and has cleared her for TAVR. She underwent cath and no CAD and TAVR CTA and has femoral access and annular sizing for either a sapien or medtronic valve. She has recently had her teeth extracted and has another 10 days of antibiotics. She has had her monitor removed two days ago in a work up of possible afib as her source of embolic TIA      Past Medical History:  Diagnosis Date   Allergy    Blood transfusion without reported diagnosis    Cataract    Diabetes mellitus without complication (HCC)    Severe aortic stenosis    Stroke [facility])     Past Surgical History:  Procedure Laterality Date   APPENDECTOMY     CHOLECYSTECTOMY     LEFT HEART CATH AND CORONARY ANGIOGRAPHY N/A 05/24/2023   Procedure: LEFT HEART CATH AND CORONARY ANGIOGRAPHY;  Surgeon: Tonny Bollman, MD;  Location: [facility] INVASIVE CV LAB;  Service: Cardiovascular;  Laterality: N/A;   PANCREATECTOMY     SPLENECTOMY, TOTAL      Social History   Tobacco Use  Smoking Status Some Days   Current packs/day: 0.00   Average packs/day: 0.5 packs/day for  40.0 years (20.0 ttl pk-yrs)   Types: Cigarettes   Passive exposure: Past  Smokeless Tobacco Never  Tobacco Comments   2-3 cigs weekly     Social History   Substance and Sexual Activity  Alcohol Use No    Social History   Socioeconomic History   Marital status: Married    Spouse name: Not on file   Number of children: Not on file   Years of education: Not on file   Highest education level: Not on file  Occupational History   Not on file  Tobacco Use   Smoking status: Some Days    Current packs/day: 0.00    Average packs/day: 0.5 packs/day for 40.0 years (20.0 ttl pk-yrs)    Types: Cigarettes    Passive exposure: Past   Smokeless tobacco: Never   Tobacco comments:    2-3 cigs weekly   Vaping Use   Vaping status: Never Used  Substance and Sexual Activity   Alcohol use: No   Drug use: Never   Sexual activity: Not Currently    Birth control/protection: Post-menopausal  Other Topics Concern   Not on file  Social History Narrative   Not on file   Social Determinants  of Health   Financial Resource Strain: Low Risk  (01/17/2023)   Overall Financial Resource Strain (CARDIA)    Difficulty of Paying Living Expenses: Not hard at all  Food Insecurity: No Food Insecurity (04/27/2023)   Hunger Vital Sign    Worried About Running Out of Food in the Last Year: Never true    Ran Out of Food in the Last Year: Never true  Transportation Needs: No Transportation Needs (04/27/2023)   PRAPARE - Administrator, Civil Service (Medical): No    Lack of Transportation (Non-Medical): No  Physical Activity: Insufficiently Active (01/17/2023)   Exercise Vital Sign    Days of Exercise per Week: 1 day    Minutes of Exercise per Session: 30 min  Stress: No Stress Concern Present (01/17/2023)   [employer] of Occupational Health - Occupational Stress Questionnaire    Feeling of Stress : Not at all  Social Connections: Moderately Isolated (01/17/2023)   Social Connection and  Isolation Panel [NHANES]    Frequency of Communication with Friends and Family: More than three times a week    Frequency of Social Gatherings with Friends and Family: More than three times a week    Attends Religious Services: Never    Database administrator or Organizations: No    Attends Banker Meetings: Never    Marital Status: Married  Catering manager Violence: Not At Risk (04/27/2023)   Humiliation, Afraid, Rape, and Kick questionnaire    Fear of Current or Ex-Partner: No    Emotionally Abused: No    Physically Abused: No    Sexually Abused: No    Allergies  Allergen Reactions   Statins     Severe leg cramps   Codeine Nausea Only    Current Outpatient Medications  Medication Sig Dispense Refill   acetaminophen (TYLENOL) 325 MG tablet Take 2 tablets (650 mg total) by mouth every 6 (six) hours as needed (Pain).     albuterol (VENTOLIN HFA) 108 (90 Base) MCG/ACT inhaler INHALE 1-2 PUFFS INTO THE LUNGS EVERY 4 (FOUR) HOURS AS NEEDED FOR WHEEZING OR SHORTNESS OF BREATH. 18 each 0   amoxicillin (AMOXIL) 875 MG tablet Take 875 mg by mouth 2 (two) times daily.     aspirin 81 MG chewable tablet Chew 1 tablet (81 mg total) by mouth daily. 90 tablet 0   calcium carbonate (TUMS EX) 750 MG chewable tablet Chew 2 tablets by mouth daily as needed for heartburn (Indigestion).     clopidogrel (PLAVIX) 75 MG tablet Take 1 tablet (75 mg total) by mouth daily. 88 tablet 0   ezetimibe (ZETIA) 10 MG tablet TAKE 1 TABLET (10 MG TOTAL) BY MOUTH DAILY AFTER SUPPER. 90 tablet 0   fluticasone (FLONASE) 50 MCG/ACT nasal spray USE 1 SPRAY IN EACH NOSTRIL TWICE DAILY FOLLOWING SINUS RINSES (Patient taking differently: as needed. USE 1 SPRAY IN EACH NOSTRIL TWICE DAILY FOLLOWING SINUS RINSES) 48 mL 0   gabapentin (NEURONTIN) 300 MG capsule Take 1 capsule (300 mg total) by mouth in the morning AND 1 capsule (300 mg total) daily in the afternoon AND 2 capsules (600 mg total) at bedtime. 360  capsule 1   metFORMIN (GLUCOPHAGE) 1000 MG tablet Take 1 tablet (1,000 mg total) by mouth 2 (two) times daily with a meal. 180 tablet 1   metoprolol tartrate (LOPRESSOR) 50 MG tablet Take one tablet by mouth as directed the morning of CT scan 1 tablet 0   montelukast (SINGULAIR) 10 MG  tablet TAKE 1 TABLET BY MOUTH EVERYDAY AT BEDTIME (Patient taking differently: Take 1 tablet by mouth daily as needed (COPD).) 90 tablet 1   OVER THE COUNTER MEDICATION Take 2-3 tablets by mouth every 4 (four) hours as needed (leg cramps). : Medication name-Hyland's Homeopathic:  Place 2-3 tabs under tongue every 4 hours . If needed, place 2-3 additional tablets under the tongue every 15 mins for up to 6 doses.     Vitamin D, Ergocalciferol, (DRISDOL) 1.25 MG (50000 UNIT) CAPS capsule Take 50,000 Units by mouth every 7 (seven) days. Take on Saturdays     No current facility-administered medications for this visit.     Family History  Problem Relation Age of Onset   Thyroid disease Mother    Varicose Veins Mother    Heart disease Father    Alcohol abuse Father    Heart attack Father    Hyperlipidemia Father    COPD Father    Depression Sister    Thyroid disease Sister    Anxiety disorder Sister    Miscarriages / [place] Sister    Thyroid disease Daughter    Diabetes Maternal Grandmother    Heart disease Maternal Grandfather    Cancer Paternal Grandfather        prostate   Hypertension Paternal Grandfather    Alcohol abuse Brother    Colon cancer Neg Hx    Breast cancer Neg Hx        Physical Exam: Teeth in good repair Lungs: clear Card: RR with harsh systolic murmur Ext: no edema Neuro: intact     Diagnostic Studies & Laboratory data: I have personally reviewed the following studies and agree with the findings   TTE (04/2023) IMPRESSIONS     1. Severely calcified aortic valve. Vmax 3.8 m/s, MG 36 mmHG, AVA 0.99  cm2, DI 0. 26. Moderate AI. Moderate to severe aortic stenosis.   Combination of moderate AI and moderate to severe AS is overall severe  aortic valve disease. The aortic valve is  calcified. There is severe calcifcation of the aortic valve. There is  severe thickening of the aortic valve. Aortic valve regurgitation is  moderate. Moderate to severe aortic valve stenosis. Aortic valve area, by  VTI measures 0.99 cm. Aortic valve mean  gradient measures 36.1 mmHg. Aortic valve Vmax measures 3.80 m/s.   2. Left ventricular ejection fraction, by estimation, is 60 to 65%. The  left ventricle has normal function. The left ventricle has no regional  wall motion abnormalities. Left ventricular diastolic parameters are  consistent with Grade I diastolic  dysfunction (impaired relaxation).   3. Right ventricular systolic function is normal. The right ventricular  size is normal. Tricuspid regurgitation signal is inadequate for assessing  PA pressure.   4. The mitral valve is grossly normal. No evidence of mitral valve  regurgitation. No evidence of mitral stenosis.   5. The inferior vena cava is normal in size with greater than 50%  respiratory variability, suggesting right atrial pressure of 3 mmHg.   FINDINGS   Left Ventricle: Left ventricular ejection fraction, by estimation, is 60  to 65%. The left ventricle has normal function. The left ventricle has no  regional wall motion abnormalities. The left ventricular internal cavity  size was normal in size. There is   no left ventricular hypertrophy. Left ventricular diastolic parameters  are consistent with Grade I diastolic dysfunction (impaired relaxation).   Right Ventricle: The right ventricular size is normal. No increase in  right  ventricular wall thickness. Right ventricular systolic function is  normal. Tricuspid regurgitation signal is inadequate for assessing PA  pressure.   Left Atrium: Left atrial size was normal in size.   Right Atrium: Right atrial size was normal in size.   Pericardium:  There is no evidence of pericardial effusion. Presence of  epicardial fat layer.   Mitral Valve: The mitral valve is grossly normal. No evidence of mitral  valve regurgitation. No evidence of mitral valve stenosis.   Tricuspid Valve: The tricuspid valve is grossly normal. Tricuspid valve  regurgitation is trivial. No evidence of tricuspid stenosis.   Aortic Valve: Severely calcified aortic valve. Vmax 3.8 m/s, MG 36 mmHG,  AVA 0.99 cm2, DI 0. 26. Moderate AI. Moderate to severe aortic stenosis.  Combination of moderate AI and moderate to severe AS is overall severe  aortic valve disease. The aortic  valve is calcified. There is severe calcifcation of the aortic valve.  There is severe thickening of the aortic valve. Aortic valve regurgitation  is moderate. Aortic regurgitation PHT measures 336 msec. Moderate to  severe aortic stenosis is present.  Aortic valve mean gradient measures 36.1 mmHg. Aortic valve peak gradient  measures 57.8 mmHg. Aortic valve area, by VTI measures 0.99 cm.   Pulmonic Valve: The pulmonic valve was grossly normal. Pulmonic valve  regurgitation is not visualized. No evidence of pulmonic stenosis.   Aorta: The aortic root and ascending aorta are structurally normal, with  no evidence of dilitation.   Venous: The inferior vena cava is normal in size with greater than 50%  respiratory variability, suggesting right atrial pressure of 3 mmHg.   IAS/Shunts: The atrial septum is grossly normal.     LEFT VENTRICLE  PLAX 2D  LVIDd:         3.70 cm   Diastology  LVIDs:         2.40 cm   LV e' medial:    6.96 cm/s  LV PW:         0.90 cm   LV E/e' medial:  11.2  LV IVS:        1.00 cm   LV e' lateral:   11.50 cm/s  LVOT diam:     2.20 cm   LV E/e' lateral: 6.8  LV SV:         89  LV SV Index:   57  LVOT Area:     3.80 cm     RIGHT VENTRICLE  RV Basal diam:  2.70 cm  RV Mid diam:    1.80 cm  RV S prime:     12.50 cm/s  TAPSE (M-mode): 1.8 cm   LEFT  ATRIUM             Index        RIGHT ATRIUM           Index  LA diam:        2.60 cm 1.67 cm/m   RA Area:     10.10 cm  LA Vol (A2C):   53.1 ml 34.15 ml/m  RA Volume:   18.60 ml  11.96 ml/m  LA Vol (A4C):   31.4 ml 20.19 ml/m  LA Biplane Vol: 43.8 ml 28.17 ml/m   AORTIC VALVE  AV Area (Vmax):    0.90 cm  AV Area (Vmean):   0.91 cm  AV Area (VTI):     0.99 cm  AV Vmax:  380.19 cm/s  AV Vmean:          272.957 cm/s  AV VTI:            0.897 m  AV Peak Grad:      57.8 mmHg  AV Mean Grad:      36.1 mmHg  LVOT Vmax:         90.14 cm/s  LVOT Vmean:        65.607 cm/s  LVOT VTI:          0.234 m  LVOT/AV VTI ratio: 0.26  AI PHT:            336 msec    AORTA  Ao Root diam: 3.10 cm  Ao Asc diam:  3.10 cm   MITRAL VALVE  MV Area (PHT): 3.14 cm     SHUNTS  MV Decel Time: 242 msec     Systemic VTI:  0.23 m  MV E velocity: 77.80 cm/s   Systemic Diam: 2.20 cm  MV A velocity: 143.00 cm/s  MV E/A ratio:  0.54   CATH (05/2023) Conclusion  1.  Widely patent coronary arteries with minimal plaquing in the RCA and LAD, but no stenosis 2.  Calcified, restricted aortic valve on plain fluoroscopy consistent with the patient's known severe aortic stenosis   Recommendations: Continue evaluation for aortic valve replacement with CTA studies followed by surgical evaluation    Recent Radiology Findings:   CT CORONARY MORPH W/CTA COR W/SCORE W/CA W/CM &/OR WO/CM  Addendum Date: 06/01/2023   ADDENDUM REPORT: 06/01/2023 13:59 ADDENDUM: Along the visualized portions of the chest there is no specific abnormal lymph node enlargement identified in the axillary regions, hilum or mediastinum. No significant pericardial effusion. The thoracic esophagus has a normal course and caliber. Mild areas of debris along the trachea. Visualized lungs are without consolidation, pneumothorax or effusion. Mild degenerative changes along the spine. Electronically Signed   By: Karen Kays M.D.   On:  06/01/2023 13:59   Result Date: 06/01/2023 CLINICAL DATA:  Severe Aortic Stenosis. EXAM: Cardiac TAVR CT TECHNIQUE: A non-contrast, gated CT scan was obtained with axial slices of 3 mm through the heart for aortic valve calcium scoring. A 90 kV retrospective, gated, contrast cardiac scan was obtained. Gantry rotation speed was 250 msecs and collimation was 0.6 mm. Nitroglycerin was not given. The 3D data set was reconstructed in 5% intervals of the 0-95% of the R-R cycle. Systolic and diastolic phases were analyzed on a dedicated workstation using MPR, MIP, and VRT modes. The patient received 100 cc of contrast. FINDINGS: Image quality: Excellent. Noise artifact is: Limited. Valve Morphology: Bicuspid aortic valve with fusion of the RCC/LCC with bulky commissural calcification. Severely calcified leaflets with restricted leaflet movement. Aortic Valve Calcium score: 1481 Aortic annular dimension: Phase assessed: 15% Annular area: 573 mm2 Annular perimeter: 85.7 mm Max diameter: 29.3 mm Min diameter: 25.6 mm Annular and subannular calcification: None. Membranous septum length: 8.6 mm Optimal coplanar projection: LAO 7 CAU 13 Coronary Artery Height above Annulus: Left Main: 13.3 mm Right Coronary: 16.1 mm Sinus of Valsalva Measurements: Non-coronary: 33 mm Right-coronary: 35 mm Left-coronary: 35 mm Sinus of Valsalva Height: Non-coronary: 25.3 mm Right-coronary: 23.8 mm Left-coronary: 21.0 mm Sinotubular Junction: 31 mm Ascending Thoracic Aorta: 33 mm Coronary Arteries: Normal coronary origin. Right dominance. The study was performed without use of NTG and is insufficient for plaque evaluation. Please refer to recent cardiac catheterization for coronary assessment. Right Atrium: Right atrial size is  within normal limits. Right Ventricle: The right ventricular cavity is within normal limits. Left Atrium: Left atrial size is normal in size with no left atrial appendage filling defect. Left Ventricle: The ventricular  cavity size is within normal limits. Pulmonary arteries: Normal in size without proximal filling defect. Pulmonary veins: Normal pulmonary venous drainage. Pericardium: Normal thickness with no significant effusion or calcium present. Mitral Valve: The mitral valve is normal structure with mild annular calcification. Extra-cardiac findings: See attached radiology report for non-cardiac structures. IMPRESSION: 1. Bicuspid aortic valve with fusion of the RCC/LCC with bulky commissural calcification 2. Annular measurements support a 29 mm S3 or 24 mm Evolut Pro. 3. No significant annular or subannular calcifications. 4. Sufficient coronary to annulus distance. 5. Optimal Fluoroscopic Angle for Delivery: LAO 7 CAU 13 [place] T. Flora Lipps, MD Electronically Signed: By: Lennie Odor M.D. On: 05/31/2023 20:02   CT ANGIO ABDOMEN PELVIS  W & WO CONTRAST  Result Date: 06/01/2023 CLINICAL DATA:  Preoperative examination prior to TAVR EXAM: CT ANGIOGRAPHY CHEST, ABDOMEN AND PELVIS TECHNIQUE: Non-contrast CT of the chest was initially obtained. Multidetector CT imaging through the chest, abdomen and pelvis was performed using the standard protocol during bolus administration of intravenous contrast. Multiplanar reconstructed images and MIPs were obtained and reviewed to evaluate the vascular anatomy. RADIATION DOSE REDUCTION: This exam was performed according to the departmental dose-optimization program which includes automated exposure control, adjustment of the mA and/or kV according to patient size and/or use of iterative reconstruction technique. CONTRAST:  95 mL OMNIPAQUE IOHEXOL 350 MG/ML SOLN COMPARISON:  Chest CT-10/27/2021; CT abdomen and pelvis-09/27/2005 FINDINGS: CTA CHEST FINDINGS Vascular Findings: No evidence of thoracic aortic aneurysm or dissection with measurements as follows. Scattered atherosclerotic plaque within the aortic arch and descending thoracic aorta, not resulting in hemodynamically significant  stenosis. No evidence of thoracic aortic dissection or perivascular stranding on this nongated examination. Conventional configuration of the aortic arch. The branch vessels of the aortic arch are diseased though without hemodynamically significant narrowing and appear patent throughout their imaged courses. Borderline cardiomegaly. Coronary artery calcifications. Exuberant calcifications involving the aortic leaflets. Although this examination was not tailored for the evaluation the pulmonary arteries, there are no discrete filling defects within the central pulmonary arterial tree to suggest central pulmonary embolism. Normal caliber of the main pulmonary artery. ------------------------------------------------------------- Thoracic aortic measurements: SINOTUBULAR JUNCTION: 32 mm as measured in greatest oblique short axis coronal dimension. PROXIMAL ASCENDING THORACIC AORTA: 35 mm as measured in greatest oblique short axis axial dimension (axial image 163, series 3) at the level of the main pulmonary artery and approximately 34 mm as measured in greatest oblique short axis coronal dimension (image 82, series 6). AORTIC ARCH: 27 mm as measured in greatest oblique short axis sagittal dimension. PROXIMAL DESCENDING THORACIC AORTA: 23 mm as measured in greatest oblique short axis axial dimension at the level of the main pulmonary artery. DISTAL DESCENDING THORACIC AORTA: 24 mm as measured in greatest oblique short axis axial dimension at the level of the diaphragmatic hiatus. Review of the MIP images confirms the above findings. ------------------------------------------------------------- Non-Vascular Findings: Mediastinum/Lymph Nodes: No bulky mediastinal, hilar or axillary lymphadenopathy. Lungs/Pleura: Minimal biapical pleural-parenchymal thickening. No discrete focal airspace opacities. No pleural effusion or pneumothorax. There is a minimal amount of nonocclusive debris within the trachea, extending to the  right mainstem bronchus. Previously questioned subpleural nodular opacity within the right lung apex is less conspicuous on the present examination and favored to represent an area of pleural-parenchymal scarring. Previously questioned  ground-glass nodules within the left lung base have also resolved in the interval. No discrete pulmonary nodules. Musculoskeletal: Regional soft tissues appear normal. Normal appearance of the thyroid gland. No acute or aggressive osseous abnormalities. _________________________________________________________ _________________________________________________________ CTA ABDOMEN AND PELVIS FINDINGS VASCULAR Aorta: There is a moderate amount of slightly irregular mixed calcified and noncalcified atherosclerotic plaque within a normal caliber abdominal aorta, not resulting in hemodynamically significant stenosis. No evidence of abdominal aortic dissection or perivascular stranding. Celiac: Widely patent without hemodynamically significant narrowing. Conventional branching pattern. SMA: Widely patent without hemodynamically significant narrowing. The distal tributaries of the SMA appear widely patent without discrete lumen filling defect to suggest distal embolism. Renals: Solitary bilaterally; the bilateral renal arteries are widely patent without hemodynamically significant narrowing. No vessel irregularity to suggest FMD. IMA: Widely patent Inflow: There is a minimal amount of eccentric calcified and noncalcified atherosclerotic plaque involving the bilateral common iliac arteries, not resulting in hemodynamically significant stenosis. The bilateral internal iliac arteries are mildly diseased though patent and of normal caliber. The bilateral external iliac arteries are of normal caliber and widely patent without hemodynamically significant narrowing The bilateral common and imaged portions of the bilateral deep and superficial femoral arteries are of normal caliber and widely patent  without hemodynamically significant narrowing throughout their imaged courses. Veins: The IVC and pelvic venous systems appear patent on this arterial phase examination. Review of the MIP images confirms the above findings. _________________________________________________________ NON-VASCULAR Evaluation of the abdominal organs is limited to the arterial phase of enhancement. Hepatobiliary: There is diffuse decreased attenuation of the hepatic parenchyma on this postcontrast examination, findings suggestive of hepatic steatosis. No discrete hyperenhancing hepatic lesions. Mild dilatation of the CBD and intrahepatic biliary ductal dilatation, likely the sequela of postcholecystectomy state biliary reservoir phenomena. No ascites. Pancreas: Normal appearance of the pancreas. 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 Adrenals/Urinary Tract: There is symmetric enhancement of the bilateral kidneys. No evidence of nephrolithiasis on this postcontrast examination. No discrete renal lesions. No urinary obstruction or perinephric stranding. Normal appearance of the bilateral adrenal glands. Normal appearance of the urinary bladder given degree of distention. Stomach/Bowel: The sigmoid and descending colon are underdistended. No evidence of enteric obstruction. Normal appearance of the terminal ileum. The appendix is not visualized however there is no pericecal inflammatory change. No hiatal hernia no pneumoperitoneum, pneumatosis or portal venous gas. Lymphatic: No bulky retroperitoneal, mesenteric, pelvic or inguinal lymphadenopathy. Reproductive: Normal appearance of the pelvic organs for age. No discrete adnexal lesions. No free fluid in the pelvic cul-de-sac. Other: There is mild diastasis involving of the midline abdominal rectus musculature. Small periumbilical mesenteric fat containing hernia. Musculoskeletal: No acute or aggressive osseous abnormalities. Review of the  MIP images confirms the above findings. IMPRESSION: 1. No evidence of thoracic or abdominal aortic aneurysm or dissection. 2. Exuberant calcifications involving the aortic leaflets compatible with impending TAVR evaluation. 3. Scattered atherosclerotic plaque within a normal caliber abdominal aorta, not resulting in a hemodynamically significant stenosis. Aortic Atherosclerosis (ICD10-I70.0). 4. Minimal amount of eccentric calcified and noncalcified atherosclerotic plaque involving the bilateral common iliac arteries, not resulting in a hemodynamically significant stenosis. 5. The bilateral external iliac arteries are of normal caliber and widely patent without a hemodynamically significant narrowing. 6. Hepatic steatosis. Electronically Signed   By: [name] M.D.   On: 06/01/2023 13:47   CT ANGIO CHEST AORTA W/CM & OR WO/CM  Result Date: 06/01/2023 CLINICAL DATA:  Preop evaluation.  Aortic valve replacement (TAVR). EXAM:  CT ANGIOGRAPHY CHEST, ABDOMEN AND PELVIS TECHNIQUE: Non-contrast CT of the chest was initially obtained. Multidetector CT imaging through the chest, abdomen and pelvis was performed using the standard protocol during bolus administration of intravenous contrast. Multiplanar reconstructed images and MIPs were obtained and reviewed to evaluate the vascular anatomy. RADIATION DOSE REDUCTION: This exam was performed according to the departmental dose-optimization program which includes automated exposure control, adjustment of the mA and/or kV according to patient size and/or use of iterative reconstruction technique. CONTRAST:  95mL OMNIPAQUE IOHEXOL 350 MG/ML SOLN COMPARISON:  CT chest 10/27/2021 FINDINGS: CTA CHEST FINDINGS Cardiovascular: The heart size is normal. Aortic atherosclerosis and coronary artery calcifications. The thoracic aorta appears intact without evidence for aneurysm or dissection. Mediastinum/Nodes: Thyroid gland, trachea, and esophagus are unremarkable. No enlarged  mediastinal or hilar lymph nodes. Lungs/Pleura: No pleural effusion, airspace consolidation, atelectasis or pneumothorax. Mild emphysema with diffuse bronchial wall thickening. Biapical pleuroparenchymal scarring identified. Unchanged subpleural nodule overlying the posterolateral right upper lobe measures 6 mm, image 31/5. This is compatible with a benign nodule requiring no further follow-up. Previous nodule within the posterior left lower lobe has resolved in the interval. No new or suspicious lung nodules identified. Musculoskeletal: No chest wall abnormality. No acute or significant osseous findings. Review of the MIP images confirms the above findings. CTA ABDOMEN AND PELVIS FINDINGS VASCULAR Aorta: Aortic atherosclerosis. 2Normal caliber aorta without aneurysm, dissection, vasculitis or significant stenosis. Celiac: Patent without evidence of aneurysm, dissection, vasculitis or significant stenosis. SMA: Patent without evidence of aneurysm, dissection, vasculitis or significant stenosis. Renals: Both renal arteries are patent without evidence of aneurysm, dissection, vasculitis, fibromuscular dysplasia or significant stenosis. IMA: Patent without evidence of aneurysm, dissection, vasculitis or significant stenosis. Inflow: Patent without evidence of aneurysm, dissection, vasculitis or significant stenosis. Veins: No obvious venous abnormality within the limitations of this arterial phase study. Review of the MIP images confirms the above findings. NON-VASCULAR Hepatobiliary: The contour the liver appears slightly irregular/nodular. There is hypertrophy of the lateral segment of left lobe of liver. Subjective steatosis identified. There is no focal liver lesion identified. Status post cholecystectomy. Chronic common bile duct measures up to 1.2 cm on today's study. Mild intrahepatic bile duct dilatation. No obstructing stone or mass identified Pancreas: Unremarkable. No pancreatic ductal dilatation or  surrounding inflammatory changes. Spleen: Status post splenectomy. Several splenules identified within the left upper quadrant of the abdomen. Adrenals/Urinary Tract: Adrenal glands are unremarkable. Kidneys are normal, without renal calculi, focal lesion, or hydronephrosis. Bladder is unremarkable. Stomach/Bowel: Stomach is within normal limits. Appendix appears normal. No evidence of bowel wall thickening, distention, or inflammatory changes. Lymphatic: No signs of abdominopelvic adenopathy. Reproductive: Uterus and bilateral adnexa are unremarkable. Other: There is no ascites or focal fluid collections identified. Supraumbilical, midline ventral abdominal wall contains fat. Additionally the anterior wall of the gastric antrum protrudes into the proximal portion of the hernia, image 117/4. Small fat containing umbilical hernia is also noted. No signs of pneumoperitoneum. Musculoskeletal: No acute or significant osseous findings. Review of the MIP images confirms the above findings. IMPRESSION: 1. No acute findings within the chest, abdomen or pelvis. 2. No evidence for thoracic or abdominal aortic aneurysm or dissection. 3. Coronary artery calcifications. 4. Supraumbilical, midline ventral abdominal wall contains fat. Additionally the anterior wall of the gastric antrum protrudes into the proximal portion of the hernia. 5. Status post cholecystectomy. Chronic common bile duct measures up to 1.2 cm on today's study. Mild intrahepatic bile duct dilatation. No obstructing stone or mass  identified. 6. Subjective hepatic steatosis. The contour the liver appears slightly irregular/nodular. There is hypertrophy of the lateral segment of left lobe of liver. Findings are suggestive of cirrhosis. 7. Aortic Atherosclerosis (ICD10-I70.0) and Emphysema (ICD10-J43.9). Electronically Signed   By: Signa Kell M.D.   On: 06/01/2023 13:27      Recent Lab Findings: Lab Results  Component Value Date   WBC 14.5 (H) 05/12/2023    HGB 14.0 05/12/2023   HCT 41.7 05/12/2023   PLT 362 05/12/2023   GLUCOSE 123 (H) 05/12/2023   CHOL 93 (L) 05/04/2023   TRIG 91 05/04/2023   HDL 41 05/04/2023   LDLDIRECT 171.2 02/10/2009   LDLCALC 34 05/04/2023   ALT 9 05/04/2023   AST 16 05/04/2023   NA 138 05/12/2023   K 4.6 05/12/2023   CL 100 05/12/2023   CREATININE 0.90 05/12/2023   BUN 18 05/12/2023   CO2 23 05/12/2023   TSH 1.970 09/13/2022   INR 0.9 04/24/2023   HGBA1C 6.1 (H) 04/25/2023      Assessment / Plan:     72 yo female with NYHA class 2 symptoms of severe AS with normal LV function, no CAD and a recent history of TIA now treated with ASA and Plavix. Holter monitor pending. She has a class I indication for AVR and with her age and comorbidities would best be served with TAVR. She understands all the risks and goals of TAVR and wishes to proceed. We discussed that if complications with TAVR would require surgery she would be a candidate for bail out and will need to stop her plavix for 5 days prior in anticipation for that. She was counseled to stop smoking. Final valve type and size per discussion with Dr Lynnette Caffey   I have spent 60 min in review of the records, viewing studies and in face to face with patient and in coordination of future care    Eugenio Hoes 06/02/2023 8:00 AM

## 2023-06-02 NOTE — Patient Instructions (Signed)
TAVR 12/3 Stop plavix 5 days prior

## 2023-06-02 NOTE — Progress Notes (Signed)
Pre Surgical Assessment: 5 M Walk Test  75M=16.48ft  5 Meter Walk Test- trial 1: 5.91 seconds 5 Meter Walk Test- trial 2: 6.25 seconds 5 Meter Walk Test- trial 3: 4.58 seconds 5 Meter Walk Test Average: 5.58 seconds

## 2023-06-05 ENCOUNTER — Encounter: Payer: Self-pay | Admitting: Physician Assistant

## 2023-06-05 ENCOUNTER — Other Ambulatory Visit: Payer: Self-pay | Admitting: Physician Assistant

## 2023-06-05 ENCOUNTER — Encounter: Payer: Self-pay | Admitting: Thoracic Surgery (Cardiothoracic Vascular Surgery)

## 2023-06-05 DIAGNOSIS — I350 Nonrheumatic aortic (valve) stenosis: Secondary | ICD-10-CM

## 2023-06-06 ENCOUNTER — Ambulatory Visit: Payer: Medicare HMO | Attending: Nurse Practitioner

## 2023-06-06 DIAGNOSIS — I639 Cerebral infarction, unspecified: Secondary | ICD-10-CM

## 2023-06-07 NOTE — Addendum Note (Signed)
Addended by: Janetta Hora on: 06/07/2023 12:41 PM   Modules accepted: Orders

## 2023-06-09 ENCOUNTER — Other Ambulatory Visit: Payer: Self-pay

## 2023-06-09 ENCOUNTER — Other Ambulatory Visit (HOSPITAL_COMMUNITY): Payer: Medicare HMO

## 2023-06-09 ENCOUNTER — Ambulatory Visit (HOSPITAL_COMMUNITY)
Admission: RE | Admit: 2023-06-09 | Discharge: 2023-06-09 | Disposition: A | Discharge status: Home / Self Care | Payer: Medicare HMO | Source: Ambulatory Visit | Attending: Physician Assistant | Admitting: Physician Assistant

## 2023-06-09 ENCOUNTER — Encounter (HOSPITAL_COMMUNITY)
Admission: RE | Admit: 2023-06-09 | Discharge: 2023-06-09 | Disposition: A | Discharge status: Home / Self Care | Payer: Medicare HMO | Source: Ambulatory Visit | Attending: Internal Medicine | Admitting: Internal Medicine

## 2023-06-09 DIAGNOSIS — Z01818 Encounter for other preprocedural examination: Secondary | ICD-10-CM | POA: Diagnosis not present

## 2023-06-09 DIAGNOSIS — I350 Nonrheumatic aortic (valve) stenosis: Secondary | ICD-10-CM | POA: Insufficient documentation

## 2023-06-09 DIAGNOSIS — I700 Atherosclerosis of aorta: Secondary | ICD-10-CM | POA: Diagnosis not present

## 2023-06-09 DIAGNOSIS — R9431 Abnormal electrocardiogram [ECG] [EKG]: Secondary | ICD-10-CM | POA: Insufficient documentation

## 2023-06-09 DIAGNOSIS — R918 Other nonspecific abnormal finding of lung field: Secondary | ICD-10-CM | POA: Diagnosis not present

## 2023-06-09 LAB — COMPREHENSIVE METABOLIC PANEL
ALT: 12 U/L (ref 0–44)
AST: 21 U/L (ref 15–41)
Albumin: 3.8 g/dL (ref 3.5–5.0)
Alkaline Phosphatase: 51 U/L (ref 38–126)
Anion gap: 9 (ref 5–15)
BUN: 11 mg/dL (ref 8–23)
CO2: 23 mmol/L (ref 22–32)
Calcium: 9.4 mg/dL (ref 8.9–10.3)
Chloride: 106 mmol/L (ref 98–111)
Creatinine, Ser: 0.82 mg/dL (ref 0.44–1.00)
GFR, Estimated: >60 mL/min (ref >60)
Glucose, Bld: 152 mg/dL — ABNORMAL HIGH (ref 70–99)
Potassium: 3.6 mmol/L (ref 3.5–5.1)
Sodium: 138 mmol/L (ref 135–145)
Total Bilirubin: 0.4 mg/dL (ref <1.2)
Total Protein: 7.4 g/dL (ref 6.5–8.1)

## 2023-06-09 LAB — CBC
HCT: 41.3 % (ref 36.0–46.0)
Hemoglobin: 13.2 g/dL (ref 12.0–15.0)
MCH: 32.5 pg (ref 26.0–34.0)
MCHC: 32 g/dL (ref 30.0–36.0)
MCV: 101.7 fL — ABNORMAL HIGH (ref 80.0–100.0)
Platelets: 419 10*3/uL — ABNORMAL HIGH (ref 150–400)
RBC: 4.06 MIL/uL (ref 3.87–5.11)
RDW: 13.2 % (ref 11.5–15.5)
WBC: 14.6 10*3/uL — ABNORMAL HIGH (ref 4.0–10.5)
nRBC: 0 % (ref 0.0–0.2)

## 2023-06-09 LAB — URINALYSIS, ROUTINE W REFLEX MICROSCOPIC
Bilirubin Urine: NEGATIVE
Glucose, UA: NEGATIVE mg/dL
Hgb urine dipstick: NEGATIVE
Ketones, ur: NEGATIVE mg/dL
Leukocytes,Ua: NEGATIVE
Nitrite: NEGATIVE
Protein, ur: NEGATIVE mg/dL
Specific Gravity, Urine: 1.014 (ref 1.005–1.030)
pH: 5 (ref 5.0–8.0)

## 2023-06-09 LAB — TYPE AND SCREEN
ABO/RH(D): A NEG
Antibody Screen: NEGATIVE

## 2023-06-09 LAB — SURGICAL PCR SCREEN
MRSA, PCR: NEGATIVE
Staphylococcus aureus: NEGATIVE

## 2023-06-09 LAB — PROTIME-INR
INR: 0.9 (ref 0.8–1.2)
Prothrombin Time: 12.4 s (ref 11.4–15.2)

## 2023-06-09 LAB — SARS CORONAVIRUS 2 (TAT 6-24 HRS): SARS Coronavirus 2: NEGATIVE

## 2023-06-09 NOTE — Pre-Procedure Instructions (Signed)
 Patient signed all consents at PAT lab appointment. CHG soap and instructions were given to patient. CHG surgical prep reviewed with patient and all questions answered.  Patients chart send to anesthesia for review. Pt denies any respiratory illness/infection in the last two months.

## 2023-06-12 MED ORDER — MAGNESIUM SULFATE 50 % IJ SOLN
40.0000 meq | INTRAMUSCULAR | Status: DC
  Filled 2023-06-12 (×2): qty 9.85

## 2023-06-12 MED ORDER — POTASSIUM CHLORIDE 2 MEQ/ML IV SOLN
80.0000 meq | INTRAVENOUS | Status: DC
  Filled 2023-06-12 (×2): qty 40

## 2023-06-12 MED ORDER — CEFAZOLIN SODIUM-DEXTROSE 2-4 GM/100ML-% IV SOLN
2.0000 g | INTRAVENOUS | Status: AC
  Administered 2023-06-13: 2 g via INTRAVENOUS
  Filled 2023-06-12 (×2): qty 100

## 2023-06-12 MED ORDER — DEXMEDETOMIDINE HCL IN NACL 400 MCG/100ML IV SOLN
0.1000 ug/kg/h | INTRAVENOUS | Status: AC
  Administered 2023-06-13: 54 ug via INTRAVENOUS
  Administered 2023-06-13: 1 ug/kg/h via INTRAVENOUS
  Filled 2023-06-12: qty 100

## 2023-06-12 MED ORDER — HEPARIN 30,000 UNITS/1000 ML (OHS) CELLSAVER SOLUTION
Status: DC
  Filled 2023-06-12 (×2): qty 1000

## 2023-06-12 MED ORDER — NOREPINEPHRINE 4 MG/250ML-% IV SOLN
0.0000 ug/min | INTRAVENOUS | Status: AC
  Administered 2023-06-13: 1 ug/min via INTRAVENOUS
  Filled 2023-06-12: qty 250

## 2023-06-12 NOTE — H&P (Signed)
[address]       [place] [address]             [phone]                                   [name] [facility] Medical Record #[id number] Date of Birth: Jul 14, 1950   Tonny Bollman, MD [name], [place]   Chief Complaint:   increasing DOE   History of Present Illness:     72 yo female who was told had a murmur for many years has over the past year more SOB with activity especially when carrying groceries or laundry. She would get a "heaviness" feeling in chest with her SOB when carrying objects. She also at the end of the day would have swollen ankles. She unfortunately suffered a TIA with inability to find words and slurred speech in October and was admitted and on work up with echo was found to have severe AS with moderate AI and an EF of 60%. Her TIA symptoms resolved in a few hours. Was treated with ASA and Plavix and has seen Neurology last week and has cleared her for TAVR. She underwent cath and no CAD and TAVR CTA and has femoral access and annular sizing for either a sapien or medtronic valve. She has recently had her teeth extracted and has another 10 days of antibiotics. She has had her monitor removed two days ago in a work up of possible afib as her source of embolic TIA             Past Medical History:  Diagnosis Date   Allergy     Blood transfusion without reported diagnosis     Cataract     Diabetes mellitus without complication (HCC)     Severe aortic stenosis     Stroke [facility])                 Past Surgical History:  Procedure Laterality Date   APPENDECTOMY       CHOLECYSTECTOMY       LEFT HEART CATH AND CORONARY ANGIOGRAPHY N/A 05/24/2023    Procedure: LEFT HEART CATH AND CORONARY ANGIOGRAPHY;  Surgeon: Tonny Bollman, MD;  Location: [facility] INVASIVE CV LAB;  Service: Cardiovascular;  Laterality: N/A;   PANCREATECTOMY       SPLENECTOMY, TOTAL              Tobacco Use History  Social History         Tobacco Use  Smoking Status Some Days   Current packs/day: 0.00   Average packs/day: 0.5 packs/day for 40.0 years (20.0 ttl pk-yrs)   Types: Cigarettes   Passive exposure: Past  Smokeless Tobacco Never  Tobacco Comments    2-3 cigs weekly       Social History       Substance and Sexual Activity  Alcohol Use No      Social History         Socioeconomic History   Marital status: Married      Spouse name: Not on file   Number of children: Not on file   Years of education: Not on file   Highest education level: Not on file  Occupational History   Not on file  Tobacco Use   Smoking status: Some Days      Current packs/day: 0.00  Average packs/day: 0.5 packs/day for 40.0 years (20.0 ttl pk-yrs)      Types: Cigarettes      Passive exposure: Past   Smokeless tobacco: Never   Tobacco comments:      2-3 cigs weekly   Vaping Use   Vaping status: Never Used  Substance and Sexual Activity   Alcohol use: No   Drug use: Never   Sexual activity: Not Currently      Birth control/protection: Post-menopausal  Other Topics Concern   Not on file  Social History Narrative   Not on file    Social Determinants of Health        Financial Resource Strain: Low Risk  (01/17/2023)    Overall Financial Resource Strain (CARDIA)     Difficulty of Paying Living Expenses: Not hard at all  Food Insecurity: No Food Insecurity (04/27/2023)    Hunger Vital Sign     Worried About Running Out of Food in the Last Year: Never true     Ran Out of Food in the Last Year: Never true  Transportation Needs: No Transportation Needs (04/27/2023)    PRAPARE - Therapist, art (Medical): No     Lack of Transportation (Non-Medical): No  Physical Activity: Insufficiently Active (01/17/2023)    Exercise Vital Sign     Days of Exercise per Week: 1 day     Minutes of Exercise per Session: 30 min  Stress: No Stress Concern Present (01/17/2023)    [employer] of Occupational  Health - Occupational Stress Questionnaire     Feeling of Stress : Not at all  Social Connections: Moderately Isolated (01/17/2023)    Social Connection and Isolation Panel [NHANES]     Frequency of Communication with Friends and Family: More than three times a week     Frequency of Social Gatherings with Friends and Family: More than three times a week     Attends Religious Services: Never     Database administrator or Organizations: No     Attends Banker Meetings: Never     Marital Status: Married  Catering manager Violence: Not At Risk (04/27/2023)    Humiliation, Afraid, Rape, and Kick questionnaire     Fear of Current or Ex-Partner: No     Emotionally Abused: No     Physically Abused: No     Sexually Abused: No      Allergies       Allergies  Allergen Reactions   Statins        Severe leg cramps   Codeine Nausea Only              Current Outpatient Medications  Medication Sig Dispense Refill   acetaminophen (TYLENOL) 325 MG tablet Take 2 tablets (650 mg total) by mouth every 6 (six) hours as needed (Pain).       albuterol (VENTOLIN HFA) 108 (90 Base) MCG/ACT inhaler INHALE 1-2 PUFFS INTO THE LUNGS EVERY 4 (FOUR) HOURS AS NEEDED FOR WHEEZING OR SHORTNESS OF BREATH. 18 each 0   amoxicillin (AMOXIL) 875 MG tablet Take 875 mg by mouth 2 (two) times daily.       aspirin 81 MG chewable tablet Chew 1 tablet (81 mg total) by mouth daily. 90 tablet 0   calcium carbonate (TUMS EX) 750 MG chewable tablet Chew 2 tablets by mouth daily as needed for heartburn (Indigestion).       clopidogrel (PLAVIX) 75 MG tablet Take 1 tablet (  75 mg total) by mouth daily. 88 tablet 0   ezetimibe (ZETIA) 10 MG tablet TAKE 1 TABLET (10 MG TOTAL) BY MOUTH DAILY AFTER SUPPER. 90 tablet 0   fluticasone (FLONASE) 50 MCG/ACT nasal spray USE 1 SPRAY IN EACH NOSTRIL TWICE DAILY FOLLOWING SINUS RINSES (Patient taking differently: as needed. USE 1 SPRAY IN EACH NOSTRIL TWICE DAILY FOLLOWING SINUS  RINSES) 48 mL 0   gabapentin (NEURONTIN) 300 MG capsule Take 1 capsule (300 mg total) by mouth in the morning AND 1 capsule (300 mg total) daily in the afternoon AND 2 capsules (600 mg total) at bedtime. 360 capsule 1   metFORMIN (GLUCOPHAGE) 1000 MG tablet Take 1 tablet (1,000 mg total) by mouth 2 (two) times daily with a meal. 180 tablet 1   metoprolol tartrate (LOPRESSOR) 50 MG tablet Take one tablet by mouth as directed the morning of CT scan 1 tablet 0   montelukast (SINGULAIR) 10 MG tablet TAKE 1 TABLET BY MOUTH EVERYDAY AT BEDTIME (Patient taking differently: Take 1 tablet by mouth daily as needed (COPD).) 90 tablet 1   OVER THE COUNTER MEDICATION Take 2-3 tablets by mouth every 4 (four) hours as needed (leg cramps). : Medication name-Hyland's Homeopathic:  Place 2-3 tabs under tongue every 4 hours . If needed, place 2-3 additional tablets under the tongue every 15 mins for up to 6 doses.       Vitamin D, Ergocalciferol, (DRISDOL) 1.25 MG (50000 UNIT) CAPS capsule Take 50,000 Units by mouth every 7 (seven) days. Take on Saturdays          No current facility-administered medications for this visit.               Family History  Problem Relation Age of Onset   Thyroid disease Mother     Varicose Veins Mother     Heart disease Father     Alcohol abuse Father     Heart attack Father     Hyperlipidemia Father     COPD Father     Depression Sister     Thyroid disease Sister     Anxiety disorder Sister     Miscarriages / [place] Sister     Thyroid disease Daughter     Diabetes Maternal Grandmother     Heart disease Maternal Grandfather     Cancer Paternal Grandfather          prostate   Hypertension Paternal Grandfather     Alcohol abuse Brother     Colon cancer Neg Hx     Breast cancer Neg Hx                  Physical Exam: Teeth in good repair Lungs: clear Card: RR with harsh systolic murmur Ext: no edema Neuro: intact         Diagnostic Studies &  Laboratory data: I have personally reviewed the following studies and agree with the findings   TTE (04/2023) IMPRESSIONS     1. Severely calcified aortic valve. Vmax 3.8 m/s, MG 36 mmHG, AVA 0.99  cm2, DI 0. 26. Moderate AI. Moderate to severe aortic stenosis.  Combination of moderate AI and moderate to severe AS is overall severe  aortic valve disease. The aortic valve is  calcified. There is severe calcifcation of the aortic valve. There is  severe thickening of the aortic valve. Aortic valve regurgitation is  moderate. Moderate to severe aortic valve stenosis. Aortic valve area, by  VTI measures 0.99 cm. Aortic  valve mean  gradient measures 36.1 mmHg. Aortic valve Vmax measures 3.80 m/s.   2. Left ventricular ejection fraction, by estimation, is 60 to 65%. The  left ventricle has normal function. The left ventricle has no regional  wall motion abnormalities. Left ventricular diastolic parameters are  consistent with Grade I diastolic  dysfunction (impaired relaxation).   3. Right ventricular systolic function is normal. The right ventricular  size is normal. Tricuspid regurgitation signal is inadequate for assessing  PA pressure.   4. The mitral valve is grossly normal. No evidence of mitral valve  regurgitation. No evidence of mitral stenosis.   5. The inferior vena cava is normal in size with greater than 50%  respiratory variability, suggesting right atrial pressure of 3 mmHg.   FINDINGS   Left Ventricle: Left ventricular ejection fraction, by estimation, is 60  to 65%. The left ventricle has normal function. The left ventricle has no  regional wall motion abnormalities. The left ventricular internal cavity  size was normal in size. There is   no left ventricular hypertrophy. Left ventricular diastolic parameters  are consistent with Grade I diastolic dysfunction (impaired relaxation).   Right Ventricle: The right ventricular size is normal. No increase in  right  ventricular wall thickness. Right ventricular systolic function is  normal. Tricuspid regurgitation signal is inadequate for assessing PA  pressure.   Left Atrium: Left atrial size was normal in size.   Right Atrium: Right atrial size was normal in size.   Pericardium: There is no evidence of pericardial effusion. Presence of  epicardial fat layer.   Mitral Valve: The mitral valve is grossly normal. No evidence of mitral  valve regurgitation. No evidence of mitral valve stenosis.   Tricuspid Valve: The tricuspid valve is grossly normal. Tricuspid valve  regurgitation is trivial. No evidence of tricuspid stenosis.   Aortic Valve: Severely calcified aortic valve. Vmax 3.8 m/s, MG 36 mmHG,  AVA 0.99 cm2, DI 0. 26. Moderate AI. Moderate to severe aortic stenosis.  Combination of moderate AI and moderate to severe AS is overall severe  aortic valve disease. The aortic  valve is calcified. There is severe calcifcation of the aortic valve.  There is severe thickening of the aortic valve. Aortic valve regurgitation  is moderate. Aortic regurgitation PHT measures 336 msec. Moderate to  severe aortic stenosis is present.  Aortic valve mean gradient measures 36.1 mmHg. Aortic valve peak gradient  measures 57.8 mmHg. Aortic valve area, by VTI measures 0.99 cm.   Pulmonic Valve: The pulmonic valve was grossly normal. Pulmonic valve  regurgitation is not visualized. No evidence of pulmonic stenosis.   Aorta: The aortic root and ascending aorta are structurally normal, with  no evidence of dilitation.   Venous: The inferior vena cava is normal in size with greater than 50%  respiratory variability, suggesting right atrial pressure of 3 mmHg.   IAS/Shunts: The atrial septum is grossly normal.     LEFT VENTRICLE  PLAX 2D  LVIDd:         3.70 cm   Diastology  LVIDs:         2.40 cm   LV e' medial:    6.96 cm/s  LV PW:         0.90 cm   LV E/e' medial:  11.2  LV IVS:        1.00 cm   LV e'  lateral:   11.50 cm/s  LVOT diam:     2.20 cm  LV E/e' lateral: 6.8  LV SV:         89  LV SV Index:   57  LVOT Area:     3.80 cm     RIGHT VENTRICLE  RV Basal diam:  2.70 cm  RV Mid diam:    1.80 cm  RV S prime:     12.50 cm/s  TAPSE (M-mode): 1.8 cm   LEFT ATRIUM             Index        RIGHT ATRIUM           Index  LA diam:        2.60 cm 1.67 cm/m   RA Area:     10.10 cm  LA Vol (A2C):   53.1 ml 34.15 ml/m  RA Volume:   18.60 ml  11.96 ml/m  LA Vol (A4C):   31.4 ml 20.19 ml/m  LA Biplane Vol: 43.8 ml 28.17 ml/m   AORTIC VALVE  AV Area (Vmax):    0.90 cm  AV Area (Vmean):   0.91 cm  AV Area (VTI):     0.99 cm  AV Vmax:           380.19 cm/s  AV Vmean:          272.957 cm/s  AV VTI:            0.897 m  AV Peak Grad:      57.8 mmHg  AV Mean Grad:      36.1 mmHg  LVOT Vmax:         90.14 cm/s  LVOT Vmean:        65.607 cm/s  LVOT VTI:          0.234 m  LVOT/AV VTI ratio: 0.26  AI PHT:            336 msec    AORTA  Ao Root diam: 3.10 cm  Ao Asc diam:  3.10 cm   MITRAL VALVE  MV Area (PHT): 3.14 cm     SHUNTS  MV Decel Time: 242 msec     Systemic VTI:  0.23 m  MV E velocity: 77.80 cm/s   Systemic Diam: 2.20 cm  MV A velocity: 143.00 cm/s  MV E/A ratio:  0.54    CATH (05/2023) Conclusion   1.  Widely patent coronary arteries with minimal plaquing in the RCA and LAD, but no stenosis 2.  Calcified, restricted aortic valve on plain fluoroscopy consistent with the patient's known severe aortic stenosis   Recommendations: Continue evaluation for aortic valve replacement with CTA studies followed by surgical evaluation    Recent Radiology Findings:    Imaging Results (Last 48 hours)  CT CORONARY MORPH W/CTA COR W/SCORE W/CA W/CM &/OR WO/CM   Addendum Date: 06/01/2023   ADDENDUM REPORT: 06/01/2023 13:59 ADDENDUM: Along the visualized portions of the chest there is no specific abnormal lymph node enlargement identified in the axillary regions, hilum or  mediastinum. No significant pericardial effusion. The thoracic esophagus has a normal course and caliber. Mild areas of debris along the trachea. Visualized lungs are without consolidation, pneumothorax or effusion. Mild degenerative changes along the spine. Electronically Signed   By: Karen Kays M.D.   On: 06/01/2023 13:59    Result Date: 06/01/2023 CLINICAL DATA:  Severe Aortic Stenosis. EXAM: Cardiac TAVR CT TECHNIQUE: A non-contrast, gated CT scan was obtained with axial slices of 3 mm through the heart for aortic valve  calcium scoring. A 90 kV retrospective, gated, contrast cardiac scan was obtained. Gantry rotation speed was 250 msecs and collimation was 0.6 mm. Nitroglycerin was not given. The 3D data set was reconstructed in 5% intervals of the 0-95% of the R-R cycle. Systolic and diastolic phases were analyzed on a dedicated workstation using MPR, MIP, and VRT modes. The patient received 100 cc of contrast. FINDINGS: Image quality: Excellent. Noise artifact is: Limited. Valve Morphology: Bicuspid aortic valve with fusion of the RCC/LCC with bulky commissural calcification. Severely calcified leaflets with restricted leaflet movement. Aortic Valve Calcium score: 1481 Aortic annular dimension: Phase assessed: 15% Annular area: 573 mm2 Annular perimeter: 85.7 mm Max diameter: 29.3 mm Min diameter: 25.6 mm Annular and subannular calcification: None. Membranous septum length: 8.6 mm Optimal coplanar projection: LAO 7 CAU 13 Coronary Artery Height above Annulus: Left Main: 13.3 mm Right Coronary: 16.1 mm Sinus of Valsalva Measurements: Non-coronary: 33 mm Right-coronary: 35 mm Left-coronary: 35 mm Sinus of Valsalva Height: Non-coronary: 25.3 mm Right-coronary: 23.8 mm Left-coronary: 21.0 mm Sinotubular Junction: 31 mm Ascending Thoracic Aorta: 33 mm Coronary Arteries: Normal coronary origin. Right dominance. The study was performed without use of NTG and is insufficient for plaque evaluation. Please refer to  recent cardiac catheterization for coronary assessment. Right Atrium: Right atrial size is within normal limits. Right Ventricle: The right ventricular cavity is within normal limits. Left Atrium: Left atrial size is normal in size with no left atrial appendage filling defect. Left Ventricle: The ventricular cavity size is within normal limits. Pulmonary arteries: Normal in size without proximal filling defect. Pulmonary veins: Normal pulmonary venous drainage. Pericardium: Normal thickness with no significant effusion or calcium present. Mitral Valve: The mitral valve is normal structure with mild annular calcification. Extra-cardiac findings: See attached radiology report for non-cardiac structures. IMPRESSION: 1. Bicuspid aortic valve with fusion of the RCC/LCC with bulky commissural calcification 2. Annular measurements support a 29 mm S3 or 24 mm Evolut Pro. 3. No significant annular or subannular calcifications. 4. Sufficient coronary to annulus distance. 5. Optimal Fluoroscopic Angle for Delivery: LAO 7 CAU 13 [place] T. Flora Lipps, MD Electronically Signed: By: Lennie Odor M.D. On: 05/31/2023 20:02    CT ANGIO ABDOMEN PELVIS  W & WO CONTRAST   Result Date: 06/01/2023 CLINICAL DATA:  Preoperative examination prior to TAVR EXAM: CT ANGIOGRAPHY CHEST, ABDOMEN AND PELVIS TECHNIQUE: Non-contrast CT of the chest was initially obtained. Multidetector CT imaging through the chest, abdomen and pelvis was performed using the standard protocol during bolus administration of intravenous contrast. Multiplanar reconstructed images and MIPs were obtained and reviewed to evaluate the vascular anatomy. RADIATION DOSE REDUCTION: This exam was performed according to the departmental dose-optimization program which includes automated exposure control, adjustment of the mA and/or kV according to patient size and/or use of iterative reconstruction technique. CONTRAST:  95 mL OMNIPAQUE IOHEXOL 350 MG/ML SOLN COMPARISON:  Chest  CT-10/27/2021; CT abdomen and pelvis-09/27/2005 FINDINGS: CTA CHEST FINDINGS Vascular Findings: No evidence of thoracic aortic aneurysm or dissection with measurements as follows. Scattered atherosclerotic plaque within the aortic arch and descending thoracic aorta, not resulting in hemodynamically significant stenosis. No evidence of thoracic aortic dissection or perivascular stranding on this nongated examination. Conventional configuration of the aortic arch. The branch vessels of the aortic arch are diseased though without hemodynamically significant narrowing and appear patent throughout their imaged courses. Borderline cardiomegaly. Coronary artery calcifications. Exuberant calcifications involving the aortic leaflets. Although this examination was not tailored for the evaluation the pulmonary arteries, there  are no discrete filling defects within the central pulmonary arterial tree to suggest central pulmonary embolism. Normal caliber of the main pulmonary artery. ------------------------------------------------------------- Thoracic aortic measurements: SINOTUBULAR JUNCTION: 32 mm as measured in greatest oblique short axis coronal dimension. PROXIMAL ASCENDING THORACIC AORTA: 35 mm as measured in greatest oblique short axis axial dimension (axial image 163, series 3) at the level of the main pulmonary artery and approximately 34 mm as measured in greatest oblique short axis coronal dimension (image 82, series 6). AORTIC ARCH: 27 mm as measured in greatest oblique short axis sagittal dimension. PROXIMAL DESCENDING THORACIC AORTA: 23 mm as measured in greatest oblique short axis axial dimension at the level of the main pulmonary artery. DISTAL DESCENDING THORACIC AORTA: 24 mm as measured in greatest oblique short axis axial dimension at the level of the diaphragmatic hiatus. Review of the MIP images confirms the above findings. ------------------------------------------------------------- Non-Vascular  Findings: Mediastinum/Lymph Nodes: No bulky mediastinal, hilar or axillary lymphadenopathy. Lungs/Pleura: Minimal biapical pleural-parenchymal thickening. No discrete focal airspace opacities. No pleural effusion or pneumothorax. There is a minimal amount of nonocclusive debris within the trachea, extending to the right mainstem bronchus. Previously questioned subpleural nodular opacity within the right lung apex is less conspicuous on the present examination and favored to represent an area of pleural-parenchymal scarring. Previously questioned ground-glass nodules within the left lung base have also resolved in the interval. No discrete pulmonary nodules. Musculoskeletal: Regional soft tissues appear normal. Normal appearance of the thyroid gland. No acute or aggressive osseous abnormalities. _________________________________________________________ _________________________________________________________ CTA ABDOMEN AND PELVIS FINDINGS VASCULAR Aorta: There is a moderate amount of slightly irregular mixed calcified and noncalcified atherosclerotic plaque within a normal caliber abdominal aorta, not resulting in hemodynamically significant stenosis. No evidence of abdominal aortic dissection or perivascular stranding. Celiac: Widely patent without hemodynamically significant narrowing. Conventional branching pattern. SMA: Widely patent without hemodynamically significant narrowing. The distal tributaries of the SMA appear widely patent without discrete lumen filling defect to suggest distal embolism. Renals: Solitary bilaterally; the bilateral renal arteries are widely patent without hemodynamically significant narrowing. No vessel irregularity to suggest FMD. IMA: Widely patent Inflow: There is a minimal amount of eccentric calcified and noncalcified atherosclerotic plaque involving the bilateral common iliac arteries, not resulting in hemodynamically significant stenosis. The bilateral internal iliac arteries  are mildly diseased though patent and of normal caliber. The bilateral external iliac arteries are of normal caliber and widely patent without hemodynamically significant narrowing The bilateral common and imaged portions of the bilateral deep and superficial femoral arteries are of normal caliber and widely patent without hemodynamically significant narrowing throughout their imaged courses. Veins: The IVC and pelvic venous systems appear patent on this arterial phase examination. Review of the MIP images confirms the above findings. _________________________________________________________ NON-VASCULAR Evaluation of the abdominal organs is limited to the arterial phase of enhancement. Hepatobiliary: There is diffuse decreased attenuation of the hepatic parenchyma on this postcontrast examination, findings suggestive of hepatic steatosis. No discrete hyperenhancing hepatic lesions. Mild dilatation of the CBD and intrahepatic biliary ductal dilatation, likely the sequela of postcholecystectomy state biliary reservoir phenomena. No ascites. Pancreas: Normal appearance of the pancreas. 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 Adrenals/Urinary Tract: There is symmetric enhancement of the bilateral kidneys. No evidence of nephrolithiasis on this postcontrast examination. No discrete renal lesions. No urinary obstruction or perinephric stranding. Normal appearance of the bilateral adrenal glands. Normal appearance of the urinary bladder given degree of distention. Stomach/Bowel: The sigmoid and descending colon are  underdistended. No evidence of enteric obstruction. Normal appearance of the terminal ileum. The appendix is not visualized however there is no pericecal inflammatory change. No hiatal hernia no pneumoperitoneum, pneumatosis or portal venous gas. Lymphatic: No bulky retroperitoneal, mesenteric, pelvic or inguinal lymphadenopathy. Reproductive: Normal appearance  of the pelvic organs for age. No discrete adnexal lesions. No free fluid in the pelvic cul-de-sac. Other: There is mild diastasis involving of the midline abdominal rectus musculature. Small periumbilical mesenteric fat containing hernia. Musculoskeletal: No acute or aggressive osseous abnormalities. Review of the MIP images confirms the above findings. IMPRESSION: 1. No evidence of thoracic or abdominal aortic aneurysm or dissection. 2. Exuberant calcifications involving the aortic leaflets compatible with impending TAVR evaluation. 3. Scattered atherosclerotic plaque within a normal caliber abdominal aorta, not resulting in a hemodynamically significant stenosis. Aortic Atherosclerosis (ICD10-I70.0). 4. Minimal amount of eccentric calcified and noncalcified atherosclerotic plaque involving the bilateral common iliac arteries, not resulting in a hemodynamically significant stenosis. 5. The bilateral external iliac arteries are of normal caliber and widely patent without a hemodynamically significant narrowing. 6. Hepatic steatosis. Electronically Signed   By: [name] M.D.   On: 06/01/2023 13:47    CT ANGIO CHEST AORTA W/CM & OR WO/CM   Result Date: 06/01/2023 CLINICAL DATA:  Preop evaluation.  Aortic valve replacement (TAVR). EXAM: CT ANGIOGRAPHY CHEST, ABDOMEN AND PELVIS TECHNIQUE: Non-contrast CT of the chest was initially obtained. Multidetector CT imaging through the chest, abdomen and pelvis was performed using the standard protocol during bolus administration of intravenous contrast. Multiplanar reconstructed images and MIPs were obtained and reviewed to evaluate the vascular anatomy. RADIATION DOSE REDUCTION: This exam was performed according to the departmental dose-optimization program which includes automated exposure control, adjustment of the mA and/or kV according to patient size and/or use of iterative reconstruction technique. CONTRAST:  95mL OMNIPAQUE IOHEXOL 350 MG/ML SOLN COMPARISON:  CT  chest 10/27/2021 FINDINGS: CTA CHEST FINDINGS Cardiovascular: The heart size is normal. Aortic atherosclerosis and coronary artery calcifications. The thoracic aorta appears intact without evidence for aneurysm or dissection. Mediastinum/Nodes: Thyroid gland, trachea, and esophagus are unremarkable. No enlarged mediastinal or hilar lymph nodes. Lungs/Pleura: No pleural effusion, airspace consolidation, atelectasis or pneumothorax. Mild emphysema with diffuse bronchial wall thickening. Biapical pleuroparenchymal scarring identified. Unchanged subpleural nodule overlying the posterolateral right upper lobe measures 6 mm, image 31/5. This is compatible with a benign nodule requiring no further follow-up. Previous nodule within the posterior left lower lobe has resolved in the interval. No new or suspicious lung nodules identified. Musculoskeletal: No chest wall abnormality. No acute or significant osseous findings. Review of the MIP images confirms the above findings. CTA ABDOMEN AND PELVIS FINDINGS VASCULAR Aorta: Aortic atherosclerosis. 2Normal caliber aorta without aneurysm, dissection, vasculitis or significant stenosis. Celiac: Patent without evidence of aneurysm, dissection, vasculitis or significant stenosis. SMA: Patent without evidence of aneurysm, dissection, vasculitis or significant stenosis. Renals: Both renal arteries are patent without evidence of aneurysm, dissection, vasculitis, fibromuscular dysplasia or significant stenosis. IMA: Patent without evidence of aneurysm, dissection, vasculitis or significant stenosis. Inflow: Patent without evidence of aneurysm, dissection, vasculitis or significant stenosis. Veins: No obvious venous abnormality within the limitations of this arterial phase study. Review of the MIP images confirms the above findings. NON-VASCULAR Hepatobiliary: The contour the liver appears slightly irregular/nodular. There is hypertrophy of the lateral segment of left lobe of liver.  Subjective steatosis identified. There is no focal liver lesion identified. Status post cholecystectomy. Chronic common bile duct measures up to 1.2 cm on today's  study. Mild intrahepatic bile duct dilatation. No obstructing stone or mass identified Pancreas: Unremarkable. No pancreatic ductal dilatation or surrounding inflammatory changes. Spleen: Status post splenectomy. Several splenules identified within the left upper quadrant of the abdomen. Adrenals/Urinary Tract: Adrenal glands are unremarkable. Kidneys are normal, without renal calculi, focal lesion, or hydronephrosis. Bladder is unremarkable. Stomach/Bowel: Stomach is within normal limits. Appendix appears normal. No evidence of bowel wall thickening, distention, or inflammatory changes. Lymphatic: No signs of abdominopelvic adenopathy. Reproductive: Uterus and bilateral adnexa are unremarkable. Other: There is no ascites or focal fluid collections identified. Supraumbilical, midline ventral abdominal wall contains fat. Additionally the anterior wall of the gastric antrum protrudes into the proximal portion of the hernia, image 117/4. Small fat containing umbilical hernia is also noted. No signs of pneumoperitoneum. Musculoskeletal: No acute or significant osseous findings. Review of the MIP images confirms the above findings. IMPRESSION: 1. No acute findings within the chest, abdomen or pelvis. 2. No evidence for thoracic or abdominal aortic aneurysm or dissection. 3. Coronary artery calcifications. 4. Supraumbilical, midline ventral abdominal wall contains fat. Additionally the anterior wall of the gastric antrum protrudes into the proximal portion of the hernia. 5. Status post cholecystectomy. Chronic common bile duct measures up to 1.2 cm on today's study. Mild intrahepatic bile duct dilatation. No obstructing stone or mass identified. 6. Subjective hepatic steatosis. The contour the liver appears slightly irregular/nodular. There is hypertrophy of the  lateral segment of left lobe of liver. Findings are suggestive of cirrhosis. 7. Aortic Atherosclerosis (ICD10-I70.0) and Emphysema (ICD10-J43.9). Electronically Signed   By: Signa Kell M.D.   On: 06/01/2023 13:27         Recent Lab Findings: Recent Labs       Lab Results  Component Value Date    WBC 14.5 (H) 05/12/2023    HGB 14.0 05/12/2023    HCT 41.7 05/12/2023    PLT 362 05/12/2023    GLUCOSE 123 (H) 05/12/2023    CHOL 93 (L) 05/04/2023    TRIG 91 05/04/2023    HDL 41 05/04/2023    LDLDIRECT 171.2 02/10/2009    LDLCALC 34 05/04/2023    ALT 9 05/04/2023    AST 16 05/04/2023    NA 138 05/12/2023    K 4.6 05/12/2023    CL 100 05/12/2023    CREATININE 0.90 05/12/2023    BUN 18 05/12/2023    CO2 23 05/12/2023    TSH 1.970 09/13/2022    INR 0.9 04/24/2023    HGBA1C 6.1 (H) 04/25/2023            Assessment / Plan:     72 yo female with NYHA class 2 symptoms of severe AS with normal LV function, no CAD and a recent history of TIA now treated with ASA and Plavix. Holter monitor pending. She has a class I indication for AVR and with her age and comorbidities would best be served with TAVR. She understands all the risks and goals of TAVR and wishes to proceed. We discussed that if complications with TAVR would require surgery she would be a candidate for bail out and will need to stop her plavix for 5 days prior in anticipation for that. She was counseled to stop smoking. Final valve type and size per discussion with Dr Lynnette Caffey

## 2023-06-13 ENCOUNTER — Inpatient Hospital Stay (HOSPITAL_COMMUNITY)
Admission: RE | Admit: 2023-06-13 | Discharge: 2023-06-14 | DRG: 267 | Disposition: A | Discharge status: Home / Self Care | Payer: Medicare HMO | Attending: Internal Medicine | Admitting: Internal Medicine

## 2023-06-13 ENCOUNTER — Inpatient Hospital Stay (HOSPITAL_COMMUNITY): Payer: Self-pay

## 2023-06-13 ENCOUNTER — Other Ambulatory Visit: Payer: Self-pay

## 2023-06-13 ENCOUNTER — Other Ambulatory Visit: Payer: Self-pay | Admitting: Physician Assistant

## 2023-06-13 ENCOUNTER — Inpatient Hospital Stay (HOSPITAL_COMMUNITY)
Admission: RE | Disposition: A | Discharge status: Home / Self Care | Payer: Medicare HMO | Source: Home / Self Care | Attending: Internal Medicine

## 2023-06-13 ENCOUNTER — Inpatient Hospital Stay (HOSPITAL_COMMUNITY): Payer: Medicare HMO

## 2023-06-13 ENCOUNTER — Encounter (HOSPITAL_COMMUNITY): Payer: Self-pay | Admitting: Internal Medicine

## 2023-06-13 ENCOUNTER — Inpatient Hospital Stay (HOSPITAL_COMMUNITY): Payer: Self-pay | Admitting: Physician Assistant

## 2023-06-13 DIAGNOSIS — F17210 Nicotine dependence, cigarettes, uncomplicated: Secondary | ICD-10-CM

## 2023-06-13 DIAGNOSIS — I70201 Unspecified atherosclerosis of native arteries of extremities, right leg: Secondary | ICD-10-CM | POA: Diagnosis not present

## 2023-06-13 DIAGNOSIS — J439 Emphysema, unspecified: Secondary | ICD-10-CM | POA: Diagnosis not present

## 2023-06-13 DIAGNOSIS — Z006 Encounter for examination for normal comparison and control in clinical research program: Secondary | ICD-10-CM | POA: Diagnosis not present

## 2023-06-13 DIAGNOSIS — I350 Nonrheumatic aortic (valve) stenosis: Secondary | ICD-10-CM

## 2023-06-13 DIAGNOSIS — I351 Nonrheumatic aortic (valve) insufficiency: Secondary | ICD-10-CM | POA: Diagnosis not present

## 2023-06-13 DIAGNOSIS — E119 Type 2 diabetes mellitus without complications: Secondary | ICD-10-CM | POA: Diagnosis not present

## 2023-06-13 DIAGNOSIS — N183 Chronic kidney disease, stage 3 unspecified: Secondary | ICD-10-CM | POA: Diagnosis present

## 2023-06-13 DIAGNOSIS — Z818 Family history of other mental and behavioral disorders: Secondary | ICD-10-CM | POA: Diagnosis not present

## 2023-06-13 DIAGNOSIS — Z952 Presence of prosthetic heart valve: Secondary | ICD-10-CM

## 2023-06-13 DIAGNOSIS — F17200 Nicotine dependence, unspecified, uncomplicated: Secondary | ICD-10-CM | POA: Diagnosis present

## 2023-06-13 DIAGNOSIS — Z79899 Other long term (current) drug therapy: Secondary | ICD-10-CM

## 2023-06-13 DIAGNOSIS — Z9081 Acquired absence of spleen: Secondary | ICD-10-CM

## 2023-06-13 DIAGNOSIS — I352 Nonrheumatic aortic (valve) stenosis with insufficiency: Principal | ICD-10-CM | POA: Diagnosis present

## 2023-06-13 DIAGNOSIS — Z83438 Family history of other disorder of lipoprotein metabolism and other lipidemia: Secondary | ICD-10-CM

## 2023-06-13 DIAGNOSIS — I129 Hypertensive chronic kidney disease with stage 1 through stage 4 chronic kidney disease, or unspecified chronic kidney disease: Secondary | ICD-10-CM | POA: Diagnosis present

## 2023-06-13 DIAGNOSIS — Z811 Family history of alcohol abuse and dependence: Secondary | ICD-10-CM

## 2023-06-13 DIAGNOSIS — R531 Weakness: Secondary | ICD-10-CM | POA: Diagnosis present

## 2023-06-13 DIAGNOSIS — E1122 Type 2 diabetes mellitus with diabetic chronic kidney disease: Secondary | ICD-10-CM | POA: Diagnosis present

## 2023-06-13 DIAGNOSIS — Z7984 Long term (current) use of oral hypoglycemic drugs: Secondary | ICD-10-CM | POA: Diagnosis not present

## 2023-06-13 DIAGNOSIS — Z833 Family history of diabetes mellitus: Secondary | ICD-10-CM

## 2023-06-13 DIAGNOSIS — Z825 Family history of asthma and other chronic lower respiratory diseases: Secondary | ICD-10-CM

## 2023-06-13 DIAGNOSIS — Z7982 Long term (current) use of aspirin: Secondary | ICD-10-CM | POA: Diagnosis not present

## 2023-06-13 DIAGNOSIS — Z8673 Personal history of transient ischemic attack (TIA), and cerebral infarction without residual deficits: Secondary | ICD-10-CM

## 2023-06-13 DIAGNOSIS — Z8249 Family history of ischemic heart disease and other diseases of the circulatory system: Secondary | ICD-10-CM

## 2023-06-13 DIAGNOSIS — Z8349 Family history of other endocrine, nutritional and metabolic diseases: Secondary | ICD-10-CM | POA: Diagnosis not present

## 2023-06-13 DIAGNOSIS — Z7902 Long term (current) use of antithrombotics/antiplatelets: Secondary | ICD-10-CM

## 2023-06-13 DIAGNOSIS — Z885 Allergy status to narcotic agent status: Secondary | ICD-10-CM

## 2023-06-13 DIAGNOSIS — Z888 Allergy status to other drugs, medicaments and biological substances status: Secondary | ICD-10-CM

## 2023-06-13 DIAGNOSIS — Z954 Presence of other heart-valve replacement: Secondary | ICD-10-CM | POA: Diagnosis not present

## 2023-06-13 DIAGNOSIS — R208 Other disturbances of skin sensation: Secondary | ICD-10-CM | POA: Diagnosis not present

## 2023-06-13 HISTORY — PX: INTRAOPERATIVE TRANSTHORACIC ECHOCARDIOGRAM: SHX6523

## 2023-06-13 HISTORY — DX: Presence of prosthetic heart valve: Z95.2

## 2023-06-13 LAB — POCT I-STAT, CHEM 8
BUN: 8 mg/dL (ref 8–23)
Calcium, Ion: 1.25 mmol/L (ref 1.15–1.40)
Chloride: 111 mmol/L (ref 98–111)
Creatinine, Ser: 0.7 mg/dL (ref 0.44–1.00)
Glucose, Bld: 135 mg/dL — ABNORMAL HIGH (ref 70–99)
HCT: 34 % — ABNORMAL LOW (ref 36.0–46.0)
Hemoglobin: 11.6 g/dL — ABNORMAL LOW (ref 12.0–15.0)
Potassium: 4.2 mmol/L (ref 3.5–5.1)
Sodium: 145 mmol/L (ref 135–145)
TCO2: 21 mmol/L — ABNORMAL LOW (ref 22–32)

## 2023-06-13 LAB — ECHOCARDIOGRAM LIMITED
AR max vel: 4.75 cm2
AV Area VTI: 4.99 cm2
AV Area mean vel: 4.84 cm2
AV Mean grad: 2 mm[Hg]
AV Peak grad: 4.3 mm[Hg]
Ao pk vel: 1.04 m/s
Area-P 1/2: 4.68 cm2
Calc EF: 60.5 %
S' Lateral: 2.7 cm
Single Plane A2C EF: 64.8 %
Single Plane A4C EF: 58.6 %

## 2023-06-13 LAB — GLUCOSE, CAPILLARY
Glucose-Capillary: 129 mg/dL — ABNORMAL HIGH (ref 70–99)
Glucose-Capillary: 116 mg/dL — ABNORMAL HIGH (ref 70–99)
Glucose-Capillary: 160 mg/dL — ABNORMAL HIGH (ref 70–99)
Glucose-Capillary: 123 mg/dL — ABNORMAL HIGH (ref 70–99)
Glucose-Capillary: 114 mg/dL — ABNORMAL HIGH (ref 70–99)

## 2023-06-13 LAB — POCT ACTIVATED CLOTTING TIME
Activated Clotting Time: 198 s
Activated Clotting Time: 239 s

## 2023-06-13 LAB — ABO/RH: ABO/RH(D): A NEG

## 2023-06-13 SURGERY — TRANSCATHETER AORTIC VALVE REPLACEMENT, TRANSFEMORAL (CATHLAB)
Anesthesia: Monitor Anesthesia Care

## 2023-06-13 MED ORDER — LACTATED RINGERS IV SOLN: INTRAVENOUS | Status: DC | PRN

## 2023-06-13 MED ORDER — ONDANSETRON HCL 4 MG/2ML IJ SOLN: 4.0000 mg | Freq: Four times a day (QID) | INTRAMUSCULAR | Status: DC | PRN

## 2023-06-13 MED ORDER — ORAL CARE MOUTH RINSE: 15.0000 mL | OROMUCOSAL | Status: DC | PRN

## 2023-06-13 MED ORDER — LIDOCAINE HCL (PF) 1 % IJ SOLN
INTRAMUSCULAR | Status: AC
Start: 2023-06-13 — End: ?
  Filled 2023-06-13: qty 30

## 2023-06-13 MED ORDER — HEPARIN SODIUM (PORCINE) 1000 UNIT/ML IJ SOLN
INTRAMUSCULAR | Status: DC | PRN
  Administered 2023-06-13: 2000 [IU] via INTRAVENOUS
  Administered 2023-06-13: 7000 [IU] via INTRAVENOUS
  Administered 2023-06-13: 8000 [IU] via INTRAVENOUS

## 2023-06-13 MED ORDER — CLOPIDOGREL BISULFATE 75 MG PO TABS
75.0000 mg | ORAL_TABLET | Freq: Every day | ORAL | Status: DC
  Administered 2023-06-14: 75 mg via ORAL
  Filled 2023-06-13: qty 1

## 2023-06-13 MED ORDER — ACETAMINOPHEN 325 MG PO TABS: 650.0000 mg | ORAL_TABLET | Freq: Four times a day (QID) | ORAL | Status: DC | PRN

## 2023-06-13 MED ORDER — ONDANSETRON HCL 4 MG/2ML IJ SOLN
INTRAMUSCULAR | Status: DC | PRN
  Administered 2023-06-13: 4 mg via INTRAVENOUS

## 2023-06-13 MED ORDER — CHLORHEXIDINE GLUCONATE 4 % EX SOLN: 30.0000 mL | CUTANEOUS | Status: DC

## 2023-06-13 MED ORDER — FENTANYL CITRATE (PF) 100 MCG/2ML IJ SOLN
INTRAMUSCULAR | Status: AC
  Filled 2023-06-13: qty 2

## 2023-06-13 MED ORDER — CEFAZOLIN SODIUM-DEXTROSE 2-4 GM/100ML-% IV SOLN
2.0000 g | Freq: Three times a day (TID) | INTRAVENOUS | Status: AC
Start: 2023-06-13 — End: 2023-06-13
  Administered 2023-06-13 (×2): 2 g via INTRAVENOUS
  Filled 2023-06-13 (×2): qty 100

## 2023-06-13 MED ORDER — SODIUM CHLORIDE 0.9% FLUSH: 3.0000 mL | INTRAVENOUS | Status: DC | PRN

## 2023-06-13 MED ORDER — CHLORHEXIDINE GLUCONATE 4 % EX SOLN: 60.0000 mL | Freq: Once | CUTANEOUS | Status: DC

## 2023-06-13 MED ORDER — LIDOCAINE HCL (PF) 1 % IJ SOLN
INTRAMUSCULAR | Status: DC | PRN
  Administered 2023-06-13: 10 mL
  Administered 2023-06-13: 2 mL
  Administered 2023-06-13: 12 mL

## 2023-06-13 MED ORDER — PROTAMINE SULFATE 10 MG/ML IV SOLN
INTRAVENOUS | Status: AC
  Filled 2023-06-13: qty 5

## 2023-06-13 MED ORDER — SODIUM CHLORIDE 0.9 % IV SOLN: 250.0000 mL | INTRAVENOUS | Status: DC | PRN

## 2023-06-13 MED ORDER — EZETIMIBE 10 MG PO TABS
10.0000 mg | ORAL_TABLET | Freq: Every day | ORAL | Status: DC
  Administered 2023-06-13: 10 mg via ORAL
  Filled 2023-06-13: qty 1

## 2023-06-13 MED ORDER — PROPOFOL 500 MG/50ML IV EMUL
INTRAVENOUS | Status: DC | PRN
  Administered 2023-06-13: 20 mg via INTRAVENOUS
  Administered 2023-06-13: 10 ug/kg/min via INTRAVENOUS
  Administered 2023-06-13 (×2): 20 mg via INTRAVENOUS

## 2023-06-13 MED ORDER — IOHEXOL 350 MG/ML SOLN
INTRAVENOUS | Status: DC | PRN
  Administered 2023-06-13: 100 mL

## 2023-06-13 MED ORDER — EPHEDRINE SULFATE (PRESSORS) 50 MG/ML IJ SOLN
INTRAMUSCULAR | Status: DC | PRN
  Administered 2023-06-13: 2.5 mg via INTRAVENOUS

## 2023-06-13 MED ORDER — GABAPENTIN 300 MG PO CAPS
600.0000 mg | ORAL_CAPSULE | Freq: Every day | ORAL | Status: DC
  Administered 2023-06-13: 600 mg via ORAL
  Filled 2023-06-13: qty 2

## 2023-06-13 MED ORDER — SODIUM CHLORIDE 0.9% FLUSH
3.0000 mL | Freq: Two times a day (BID) | INTRAVENOUS | Status: DC
  Administered 2023-06-13: 3 mL via INTRAVENOUS

## 2023-06-13 MED ORDER — FENTANYL CITRATE (PF) 100 MCG/2ML IJ SOLN
INTRAMUSCULAR | Status: DC | PRN
  Administered 2023-06-13: 25 ug via INTRAVENOUS

## 2023-06-13 MED ORDER — LACTATED RINGERS IV SOLN
INTRAVENOUS | Status: DC
Start: 2023-06-13 — End: 2023-06-13

## 2023-06-13 MED ORDER — GABAPENTIN 300 MG PO CAPS
300.0000 mg | ORAL_CAPSULE | Freq: Every day | ORAL | Status: DC
  Administered 2023-06-13: 300 mg via ORAL
  Filled 2023-06-13: qty 1

## 2023-06-13 MED ORDER — VERAPAMIL HCL 2.5 MG/ML IV SOLN
INTRAVENOUS | Status: AC
Start: 2023-06-13 — End: ?
  Filled 2023-06-13: qty 2

## 2023-06-13 MED ORDER — ACETAMINOPHEN 650 MG RE SUPP: 650.0000 mg | Freq: Four times a day (QID) | RECTAL | Status: DC | PRN

## 2023-06-13 MED ORDER — LIDOCAINE HCL (PF) 1 % IJ SOLN
INTRAMUSCULAR | Status: AC
  Filled 2023-06-13: qty 30

## 2023-06-13 MED ORDER — OXYCODONE HCL 5 MG PO TABS
5.0000 mg | ORAL_TABLET | ORAL | Status: DC | PRN
  Administered 2023-06-14: 5 mg via ORAL
  Filled 2023-06-13: qty 1

## 2023-06-13 MED ORDER — CLEVIDIPINE BUTYRATE 0.5 MG/ML IV EMUL
INTRAVENOUS | Status: AC
Start: 2023-06-13 — End: ?
  Filled 2023-06-13: qty 50

## 2023-06-13 MED ORDER — HEPARIN (PORCINE) 25000 UT/250ML-% IV SOLN
INTRAVENOUS | Status: AC
  Filled 2023-06-13: qty 250

## 2023-06-13 MED ORDER — CHLORHEXIDINE GLUCONATE 0.12 % MT SOLN
15.0000 mL | Freq: Once | OROMUCOSAL | Status: AC
  Administered 2023-06-13: 15 mL via OROMUCOSAL
  Filled 2023-06-13: qty 15

## 2023-06-13 MED ORDER — PROTAMINE SULFATE 10 MG/ML IV SOLN
INTRAVENOUS | Status: DC | PRN
Start: 2023-06-13 — End: 2023-06-13
  Administered 2023-06-13: 50 mg via INTRAVENOUS

## 2023-06-13 MED ORDER — GABAPENTIN 300 MG PO CAPS
300.0000 mg | ORAL_CAPSULE | Freq: Every morning | ORAL | Status: DC
  Administered 2023-06-14: 300 mg via ORAL
  Filled 2023-06-13: qty 1

## 2023-06-13 MED ORDER — NITROGLYCERIN IN D5W 200-5 MCG/ML-% IV SOLN
0.0000 ug/min | INTRAVENOUS | Status: DC
  Filled 2023-06-13: qty 250

## 2023-06-13 MED ORDER — PROTAMINE SULFATE 10 MG/ML IV SOLN
INTRAVENOUS | Status: AC
  Filled 2023-06-13: qty 10

## 2023-06-13 MED ORDER — SODIUM CHLORIDE 0.9 % IV SOLN
INTRAVENOUS | Status: DC
Start: 2023-06-14 — End: 2023-06-13

## 2023-06-13 MED ORDER — INSULIN ASPART 100 UNIT/ML IJ SOLN
0.0000 [IU] | Freq: Three times a day (TID) | INTRAMUSCULAR | Status: DC
  Administered 2023-06-13: 2 [IU] via SUBCUTANEOUS

## 2023-06-13 MED ORDER — VERAPAMIL HCL 2.5 MG/ML IV SOLN
INTRAVENOUS | Status: DC | PRN
  Administered 2023-06-13: 5 mL via INTRA_ARTERIAL

## 2023-06-13 MED ORDER — HEPARIN SODIUM (PORCINE) 1000 UNIT/ML IJ SOLN
INTRAMUSCULAR | Status: AC
  Filled 2023-06-13: qty 10

## 2023-06-13 MED ORDER — ASPIRIN 81 MG PO CHEW
81.0000 mg | CHEWABLE_TABLET | Freq: Every day | ORAL | Status: DC
  Administered 2023-06-13 – 2023-06-14 (×2): 81 mg via ORAL
  Filled 2023-06-13 (×2): qty 1

## 2023-06-13 MED ORDER — SODIUM CHLORIDE 0.9 % IV SOLN: INTRAVENOUS | Status: AC

## 2023-06-13 SURGICAL SUPPLY — 38 items
BAG SNAP BAND KOVER 36X36 (MISCELLANEOUS) ×2 IMPLANT
BALLN MUSTANG 4.0X40 75 (BALLOONS) ×1
BALLN MUSTANG 6.0X40 75 (BALLOONS) ×1
BALLOON MUSTANG 4.0X40 75 (BALLOONS) IMPLANT
BALLOON MUSTANG 6.0X40 75 (BALLOONS) IMPLANT
CABLE SURGICAL S-101-97-12 (CABLE) IMPLANT
CATH COMMANDER DELIVERY SYS 29 (CATHETERS) IMPLANT
CATH CROSS OVER TEMPO 5F (CATHETERS) IMPLANT
CATH DIAG 6FR PIGTAIL ANGLED (CATHETERS) IMPLANT
CATH INFINITI 5FR ANG PIGTAIL (CATHETERS) IMPLANT
CATH INFINITI 6F AL1 (CATHETERS) IMPLANT
CATH QUICKCROSS .035X135CM (MICROCATHETER) IMPLANT
CLOSURE PERCLOSE PROSTYLE (VASCULAR PRODUCTS) IMPLANT
CRIMPER (MISCELLANEOUS) IMPLANT
DEVICE INFLATION ATRION QL38 (MISCELLANEOUS) IMPLANT
DEVICE RAD COMP TR BAND LRG (VASCULAR PRODUCTS) IMPLANT
FILTER CEREBRAL PROT SENTINEL (FILTER) IMPLANT
GLIDESHEATH SLEND SS 6F .021 (SHEATH) IMPLANT
GUIDEWIRE ANGLED .035X260CM (WIRE) IMPLANT
KIT ENCORE 26 ADVANTAGE (KITS) IMPLANT
KIT SAPIAN 3 ULTRA RESILIA 29 (Valve) IMPLANT
PACK CARDIAC CATHETERIZATION (CUSTOM PROCEDURE TRAY) ×1 IMPLANT
SET ATX-X65L (MISCELLANEOUS) IMPLANT
SHEATH CATAPULT 6FR 45 (SHEATH) IMPLANT
SHEATH INTRODUCER SET 29 (SHEATH) IMPLANT
SHEATH PINNACLE 6F 10CM (SHEATH) IMPLANT
SHEATH PINNACLE 8F 10CM (SHEATH) IMPLANT
SHEATH PROBE COVER 6X72 (BAG) IMPLANT
STOPCOCK MORSE 400PSI 3WAY (MISCELLANEOUS) ×2 IMPLANT
TUBING CIL FLEX 10 FLL-RA (TUBING) IMPLANT
WIRE AMPLATZ SS-J .035X180CM (WIRE) IMPLANT
WIRE CHOICE GRAPHX PT 300 (WIRE) IMPLANT
WIRE EMERALD 3MM-J .035X150CM (WIRE) IMPLANT
WIRE EMERALD 3MM-J .035X260CM (WIRE) IMPLANT
WIRE EMERALD ST .035X260CM (WIRE) IMPLANT
WIRE HI TORQ VERSACORE 300 (WIRE) IMPLANT
WIRE MICRO SET SILHO 5FR 7 (SHEATH) IMPLANT
WIRE SAFARI SM CURVE 275 (WIRE) IMPLANT

## 2023-06-13 NOTE — Progress Notes (Signed)
  Echocardiogram 2D Echocardiogram has been performed.  Autumn Walker 06/13/2023, 11:53 AM
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
## 2023-06-13 NOTE — Interval H&P Note (Signed)
History and Physical Interval Note:  06/13/2023 7:09 AM  Autumn Walker  has presented today for surgery, with the diagnosis of severe aortic stenosis.  The various methods of treatment have been discussed with the patient and family. After consideration of risks, benefits and other options for treatment, the patient has consented to  Procedure(s): Transcatheter Aortic Valve Replacement, Transfemoral with Cerebral Embolic Protection (N/A) INTRAOPERATIVE TRANSTHORACIC ECHOCARDIOGRAM (N/A) as a surgical intervention.  The patient's history has been reviewed, patient examined, no change in status, stable for surgery.  I have reviewed the patient's chart and labs.  Questions were answered to the patient's satisfaction.     Eugenio Hoes

## 2023-06-13 NOTE — Anesthesia Preprocedure Evaluation (Signed)
Anesthesia Evaluation  Patient identified by MRN, date of birth, ID band Patient awake    Reviewed: Allergy & Precautions, NPO status , Patient's Chart, lab work & pertinent test results  Airway Mallampati: II  TM Distance: >3 FB Neck ROM: Full    Dental no notable dental hx.    Pulmonary COPD, Current Smoker and Patient abstained from smoking.   Pulmonary exam normal        Cardiovascular hypertension, Pt. on medications and Pt. on home beta blockers + Valvular Problems/Murmurs AS and AI  Rhythm:Regular Rate:Normal + Systolic murmurs ECHO:   1. Severely calcified aortic valve. Vmax 3.8 m/s, MG 36 mmHG, AVA 0.99 cm2, DI 0. 26. Moderate AI. Moderate to severe aortic stenosis. Combination of moderate AI and moderate to severe AS is overall severe aortic valve disease. The aortic valve is calcified. There is severe calcifcation of the aortic valve. There is severe thickening of the aortic valve. Aortic valve regurgitation is moderate. Moderate to severe aortic valve stenosis. Aortic valve area, by VTI measures 0.99 cm. Aortic valve mean gradient measures 36.1 mmHg. Aortic valve Vmax measures 3.80 m/s.  2. Left ventricular ejection fraction, by estimation, is 60 to 65%. The left ventricle has normal function. The left ventricle has no regional wall motion abnormalities. Left ventricular diastolic parameters are consistent with Grade I diastolic dysfunction (impaired relaxation).  3. Right ventricular systolic function is normal. The right ventricular size is normal. Tricuspid regurgitation signal is inadequate for assessing PA pressure.  4. The mitral valve is grossly normal. No evidence of mitral valve regurgitation. No evidence of mitral stenosis.  5. The inferior vena cava is normal in size with greater than 50% respiratory variability, suggesting right atrial pressure of 3 mmHg.      Neuro/Psych CVA  negative psych ROS    GI/Hepatic negative GI ROS, Neg liver ROS,,,  Endo/Other  diabetes, Type 2, Oral Hypoglycemic Agents    Renal/GU   negative genitourinary   Musculoskeletal negative musculoskeletal ROS (+)    Abdominal Normal abdominal exam  (+)   Peds  Hematology Lab Results      Component                Value               Date                      WBC                      14.6 (H)            06/09/2023                HGB                      13.2                06/09/2023                HCT                      41.3                06/09/2023                MCV                      101.7 (  H)           06/09/2023                PLT                      419 (H)             06/09/2023              Anesthesia Other Findings   Reproductive/Obstetrics                             Anesthesia Physical Anesthesia Plan  ASA: 4  Anesthesia Plan: MAC   Post-op Pain Management:    Induction: Intravenous  PONV Risk Score and Plan: 1 and Treatment may vary due to age or medical condition, Ondansetron and Dexamethasone  Airway Management Planned: Simple Face Mask and Nasal Cannula  Additional Equipment: None  Intra-op Plan:   Post-operative Plan:   Informed Consent: I have reviewed the patients History and Physical, chart, labs and discussed the procedure including the risks, benefits and alternatives for the proposed anesthesia with the patient or authorized representative who has indicated his/her understanding and acceptance.     Dental advisory given  Plan Discussed with: CRNA  Anesthesia Plan Comments:        Anesthesia Quick Evaluation

## 2023-06-13 NOTE — Progress Notes (Signed)
Patients left groin site continues to ooze blood. Blood contained to gauze in place. Old, dry blood noted with fresh blood present. Blood does not drain outside of the dressing in place. TR band to right radial remains in place, slowly releasing air and monitoring site frequently d/t bleeding. Currently TR band has 0cc of air, but in place. Plan to transition to gauze and clear dressing. Per Cardiology note; will hold heparin gtt d/t patient continuing to ooze blood at surgical site.

## 2023-06-13 NOTE — Op Note (Signed)
[facility]   MULTIDISCIPLINARY HEART VALVE TEAM   TAVR OPERATIVE NOTE   Date of Procedure:  06/13/2023  Preoperative Diagnosis: Severe Aortic Stenosis   Postoperative Diagnosis: Same   Procedure:   Transcatheter Aortic Valve Replacement - Percutaneous Right Transfemoral Approach  Edwards Sapien 3 Ultra THV (size 29 mm, model # [id number])  Sentinal Cerebral protection device placement              Right common femoral artery balloon angioplasty  Co-Surgeons:  Eugenio Hoes MD and Alverda Skeans, MD   Anesthesiologist:  Dr Nance Pew  Echocardiographer:  Dr Rosealee Albee  Pre-operative Echo Findings: Severe aortic stenosis normal left ventricular systolic function  Post-operative Echo Findings: no paravalvular leak Normal left ventricular systolic function   BRIEF CLINICAL NOTE AND INDICATIONS FOR SURGERY  72 yo female with NYHA class 2 symptoms of severe AS with normal LV function, no CAD and a recent history of TIA now treated with ASA and Plavix. Holter monitor pending. She has a class I indication for AVR and with her age and comorbidities would best be served with TAVR. She understands all the risks and goals of TAVR and wishes to proceed.     DETAILS OF THE OPERATIVE PROCEDURE  PREPARATION:    The patient was brought to the operating room on the above mentioned date and appropriate monitoring was established by the anesthesia team. The patient was placed in the supine position on the operating table.  Intravenous antibiotics were administered. The patient was monitored closely throughout the procedure under conscious sedation.   Baseline transthoracic echocardiogram was performed. The patient's abdomen and both groins were prepped and draped in a sterile manner. A time out procedure was performed.   PERIPHERAL ACCESS:    Using the modified Seldinger technique, femoral arterial and venous access was obtained with placement of 6 Fr sheaths on the left  side.  A pigtail diagnostic catheter was passed through the left arterial sheath under fluoroscopic guidance into the aortic root.. Aortic root angiography was performed in order to determine the optimal angiographic angle for valve deployment. A right radial arterial catheter was placed for introduction of the Sentinal Cerebral protection device which was advanced into position after full heparinization was performed again under flouroscopic guidance   TRANSFEMORAL ACCESS:   Percutaneous transfemoral access and sheath placement was performed using ultrasound guidance.  The right common femoral artery was cannulated using a micropuncture needle and appropriate location was verified using hand injection angiogram.  A pair of Abbott Perclose percutaneous closure devices were placed and a 6 French sheath replaced into the femoral artery.  The patient was heparinized systemically and ACT verified > 250 seconds.    A 16 Fr transfemoral E-sheath was introduced into the right common femoral artery after progressively dilating over an Amplatz superstiff wire. An AL2 catheter was used to direct a straight-tip exchange length wire across the native aortic valve into the left ventricle. This was exchanged out for a pigtail catheter and position was confirmed in the LV apex. Simultaneous LV and Ao pressures were recorded with a LVEDP of .  The pigtail catheter was exchanged for a Safari wire in the LV apex. This was used for direct LV pacing and the pacemaker was tested to ensure stable lead placement and pacemaker capture.   BALLOON AORTIC VALVULOPLASTY:   Was not perfromed  TRANSCATHETER HEART VALVE DEPLOYMENT:   An Edwards Sapien 3 Ultra transcatheter heart valve (size 29 mm) was prepared and crimped per  manufacturer's guidelines, and the proper orientation of the valve is confirmed on the [employer] delivery system. The valve was advanced through the introducer sheath using normal technique until  in an appropriate position in the abdominal aorta beyond the sheath tip. The balloon was then retracted and using the fine-tuning wheel was centered on the valve. The valve was then advanced across the aortic arch using appropriate flexion of the catheter. The valve was carefully positioned across the aortic valve annulus. The Commander catheter was retracted using normal technique. Once final position of the valve has been confirmed by angiographic assessment, the valve is deployed during rapid ventricular pacing to maintain systolic blood pressure < 50 mmHg and pulse pressure < 10 mmHg. The balloon inflation is held for >3 seconds after reaching full deployment volume. Once the balloon has fully deflated the balloon is retracted into the ascending aorta and valve function is assessed using echocardiography. There is felt to be no paravalvular leak and no central aortic insufficiency.  The patient's hemodynamic recovery following valve deployment is good.  The deployment balloon and guidewire are both removed.    PROCEDURE COMPLETION:   The sheath was removed and femoral artery closure performed. Angiography of the Right iliac system noted occlusion of the access site. The left femoral artery catheter was then exchanged for a sheath and a glide wire was advanced across the access site. Serial 6mm then 8mm balloon inflations were then performed with resolution of the obstruction. Vascular surgery was consulted and heparinization for 24hrs was felt best with close observation.   The , pigtail catheter and femoral sheaths were removed with manual pressure used for venous hemostasis.  A perclose femoral closure device was utilized following removal of the diagnostic sheath in the left femoral artery.  The patient tolerated the procedure well and is transported to the cath lab recovery area in stable condition. There were no immediate intraoperative complications. All sponge instrument and needle counts are  verified correct at completion of the operation.   No blood products were administered during the operation.  The patient received a total of 60 mL of intravenous contrast during the procedure.   Eugenio Hoes, MD 06/13/2023 1:11 PM

## 2023-06-13 NOTE — Consult Note (Signed)
Hospital Consult    Reason for Consult: Right common femoral artery stenosis Referring Physician: Dr. Lynnette Caffey MRN #:  [id number]  History of Present Illness: This is a 72 y.o. female underwent TAVR today.  During the procedure she was noted to have occlusion of her right common femoral artery this was subsequently ballooned open and there was a palpable right common femoral pulse.  Patient states that she does have some numbness in the right great toe.  She does not have any preoperative right lower extremity symptoms.  Her walking preoperatively was limited by shortness of breath for over 1 year for which she was undergone TAVR but she did not have any frank claudication rest pain or tissue loss.  She has never had any lower extremity vascular procedures.  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 Lynnette Caffey and Leafy Ro   Severe aortic stenosis    Stroke [facility])     Past Surgical History:  Procedure Laterality Date   APPENDECTOMY     CHOLECYSTECTOMY     LEFT HEART CATH AND CORONARY ANGIOGRAPHY N/A 05/24/2023   Procedure: LEFT HEART CATH AND CORONARY ANGIOGRAPHY;  Surgeon: Tonny Bollman, MD;  Location: [facility] INVASIVE CV LAB;  Service: Cardiovascular;  Laterality: N/A;   PANCREATECTOMY     SPLENECTOMY, TOTAL      Allergies  Allergen Reactions   Statins     Severe leg cramps   Codeine Nausea Only    Abdominal pain.    Prior to Admission medications   Medication Sig Start Date End Date Taking? Authorizing Provider  acetaminophen (TYLENOL) 500 MG tablet Take 1,000 mg by mouth every 6 (six) hours as needed (pain.).   Yes [provider]  amoxicillin (AMOXIL) 875 MG tablet Take 875 mg by mouth 2 (two) times daily. 05/18/23  Yes [provider]  aspirin 81 MG chewable tablet Chew 1 tablet (81 mg total) by mouth daily. Patient taking differently: Chew 81 mg by mouth at bedtime. 04/27/23  Yes Hongalgi, Maximino Greenland, MD  calcium carbonate (TUMS EX) 750 MG chewable tablet Chew 2 tablets by mouth daily as needed for heartburn (Indigestion).   Yes [provider]  clopidogrel (PLAVIX) 75 MG tablet Take 1 tablet (75 mg total) by mouth daily. Patient taking differently: Take 75 mg by mouth at bedtime. 04/27/23  Yes Hongalgi, Maximino Greenland, MD  ezetimibe (ZETIA) 10 MG tablet TAKE 1 TABLET (10 MG TOTAL) BY MOUTH DAILY AFTER SUPPER. 04/24/23  Yes Edstrom, Morgan A, PA  fluticasone (FLONASE) 50 MCG/ACT nasal spray USE 1 SPRAY IN EACH NOSTRIL TWICE DAILY FOLLOWING SINUS RINSES Patient taking differently: as needed. USE 1 SPRAY IN EACH NOSTRIL TWICE DAILY FOLLOWING SINUS RINSES 02/05/19  Yes [name], Gazelle, DO  gabapentin (NEURONTIN) 300 MG capsule Take 1 capsule (300 mg total) by mouth in the morning AND 1 capsule (300 mg total) daily in the afternoon AND 2 capsules (600 mg total) at bedtime. 05/15/23  Yes [name] A, PA  metFORMIN (GLUCOPHAGE) 1000 MG tablet Take 1 tablet (1,000 mg total) by mouth 2 (two) times daily with a meal. 01/13/23  Yes Edstrom, Morgan A, PA  OVER THE COUNTER MEDICATION Take 2-3 tablets by mouth every 4 (four) hours as  needed (leg cramps). : Medication name-Hyland's Homeopathic:  Place 2-3 tabs under tongue every 4 hours . If needed, place 2-3 additional tablets under the tongue every 15 mins for up to 6 doses.   Yes [provider]  Vitamin D, Ergocalciferol, (DRISDOL) 1.25 MG (50000 UNIT) CAPS capsule Take 50,000 Units by mouth every Sunday.   Yes [provider]  albuterol (VENTOLIN HFA) 108 (90 Base) MCG/ACT inhaler INHALE 1-2 PUFFS INTO THE LUNGS EVERY 4 (FOUR) HOURS AS NEEDED FOR WHEEZING OR SHORTNESS OF BREATH. 08/17/20   Mayer Masker, PA-C    Social History   Socioeconomic History    Marital status: Married    Spouse name: Not on file   Number of children: Not on file   Years of education: Not on file   Highest education level: Not on file  Occupational History   Not on file  Tobacco Use   Smoking status: Some Days    Current packs/day: 0.00    Average packs/day: 0.5 packs/day for 40.0 years (20.0 ttl pk-yrs)    Types: Cigarettes    Passive exposure: Past   Smokeless tobacco: Never   Tobacco comments:    2-3 cigs weekly   Vaping Use   Vaping status: Never Used  Substance and Sexual Activity   Alcohol use: No   Drug use: Never   Sexual activity: Not Currently    Birth control/protection: Post-menopausal  Other Topics Concern   Not on file  Social History Narrative   Not on file   Social Determinants of Health   Financial Resource Strain: Low Risk  (01/17/2023)   Overall Financial Resource Strain (CARDIA)    Difficulty of Paying Living Expenses: Not hard at all  Food Insecurity: No Food Insecurity (04/27/2023)   Hunger Vital Sign    Worried About Running Out of Food in the Last Year: Never true    Ran Out of Food in the Last Year: Never true  Transportation Needs: No Transportation Needs (04/27/2023)   PRAPARE - Administrator, Civil Service (Medical): No    Lack of Transportation (Non-Medical): No  Physical Activity: Insufficiently Active (01/17/2023)   Exercise Vital Sign    Days of Exercise per Week: 1 day    Minutes of Exercise per Session: 30 min  Stress: No Stress Concern Present (01/17/2023)   [employer] of Occupational Health - Occupational Stress Questionnaire    Feeling of Stress : Not at all  Social Connections: Moderately Isolated (01/17/2023)   Social Connection and Isolation Panel [NHANES]    Frequency of Communication with Friends and Family: More than three times a week    Frequency of Social Gatherings with Friends and Family: More than three times a week    Attends Religious Services: Never    Doctor, general practice or Organizations: No    Attends Banker Meetings: Never    Marital Status: Married  Catering manager Violence: Not At Risk (04/27/2023)   Humiliation, Afraid, Rape, and Kick questionnaire    Fear of Current or Ex-Partner: No    Emotionally Abused: No    Physically Abused: No    Sexually Abused: No     Family History  Problem Relation Age of Onset   Thyroid disease Mother    Varicose Veins Mother    Heart disease Father    Alcohol abuse Father    Heart attack Father    Hyperlipidemia Father    COPD Father    Depression  Sister    Thyroid disease Sister    Anxiety disorder Sister    Miscarriages / [place] Sister    Thyroid disease Daughter    Diabetes Maternal Grandmother    Heart disease Maternal Grandfather    Cancer Paternal Grandfather        prostate   Hypertension Paternal Grandfather    Alcohol abuse Brother    Colon cancer Neg Hx    Breast cancer Neg Hx     Review of Systems  Constitutional: Negative.   HENT: Negative.    Eyes: Negative.   Respiratory:  Positive for shortness of breath.   Cardiovascular: Negative.   Gastrointestinal: Negative.   Musculoskeletal: Negative.   Skin: Negative.   Neurological:  Positive for tingling.  Endo/Heme/Allergies: Negative.   Psychiatric/Behavioral: Negative.        Physical Examination  Vitals:   06/13/23 1615 06/13/23 1630  BP: 105/60 (!) 93/59  Pulse: 63 61  Resp: 14 15  Temp:    SpO2: 98% 100%   Body mass index is 22.31 kg/m.  Physical Exam Constitutional:      Appearance: Normal appearance.  HENT:     Nose:     Comments: Wearing a mask Eyes:     Pupils: Pupils are equal, round, and reactive to light.  Cardiovascular:     Rate and Rhythm: Normal rate.     Pulses:          Femoral pulses are 2+ on the right side and 2+ on the left side.      Dorsalis pedis pulses are detected w/ Doppler on the right side and detected w/ Doppler on the left side.       Posterior tibial  pulses are detected w/ Doppler on the right side and detected w/ Doppler on the left side.     Comments: Right sided signals are monophasic Pulmonary:     Effort: Pulmonary effort is normal.  Abdominal:     General: Abdomen is flat.     Palpations: Abdomen is soft.  Musculoskeletal:        General: Normal range of motion.     Right lower leg: No edema.     Left lower leg: No edema.  Skin:    General: Skin is warm.     Capillary Refill: Capillary refill takes 2 to 3 seconds.  Neurological:     General: No focal deficit present.     Mental Status: She is alert.  Psychiatric:        Mood and Affect: Mood normal.      CBC    Component Value Date/Time   WBC 14.6 (H) 06/09/2023 0945   RBC 4.06 06/09/2023 0945   HGB 11.6 (L) 06/13/2023 1518   HGB 14.0 05/12/2023 1227   HCT 34.0 (L) 06/13/2023 1518   HCT 41.7 05/12/2023 1227   PLT 419 (H) 06/09/2023 0945   PLT 362 05/12/2023 1227   MCV 101.7 (H) 06/09/2023 0945   MCV 99 (H) 05/12/2023 1227   MCH 32.5 06/09/2023 0945   MCHC 32.0 06/09/2023 0945   RDW 13.2 06/09/2023 0945   RDW 12.5 05/12/2023 1227   LYMPHSABS 6.0 (H) 05/04/2023 1039   MONOABS 1.1 (H) 04/24/2023 1848   EOSABS 0.6 (H) 05/04/2023 1039   BASOSABS 0.2 05/04/2023 1039    BMET    Component Value Date/Time   NA 145 06/13/2023 1518   NA 138 05/12/2023 1227   K 4.2 06/13/2023 1518   CL 111 06/13/2023  1518   CO2 23 06/09/2023 0945   GLUCOSE 135 (H) 06/13/2023 1518   BUN 8 06/13/2023 1518   BUN 18 05/12/2023 1227   CREATININE 0.70 06/13/2023 1518   CREATININE 0.67 03/30/2016 0752   CALCIUM 9.4 06/09/2023 0945   GFRNONAA >60 06/09/2023 0945   GFRNONAA >89 03/30/2016 0752   GFRAA 89 04/09/2020 1134   GFRAA >89 03/30/2016 0752    COAGS: Lab Results  Component Value Date   INR 0.9 06/09/2023   INR 0.9 04/24/2023     Non-Invasive Vascular Imaging:   Preoperative CT scan and intraoperative limited right lower extremity angiography  reviewed   ASSESSMENT/PLAN: This is a 72 y.o. female status post TAVR with concern for right common femoral artery stenosis at completion.  She does have some numbness of her right great toe but her foot actually appears well-perfused with similarly delayed capillary refill bilaterally.  Her dorsalis pedis and posterior tibial signals are somewhat sluggish relative to the left but again she remains apparently asymptomatic at this time.  I will make her n.p.o. past midnight and recheck her foot in the morning and if necessary she could undergo common femoral endarterectomy.  Tequita Marrs C. Randie Heinz, MD [facility] Office: [phone] Pager: [phone]
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
## 2023-06-13 NOTE — Progress Notes (Signed)
Patient right groin continues to have slight track ooze. Katie, PA in room and injected more lidocaine with epinephrine and also administered Silver nitrate to site. New gauze and tegaderm dressing applied. No new oozing noted. Dressing is clean, dry and intact.

## 2023-06-13 NOTE — Progress Notes (Addendum)
  [facility]   MULTIDISCIPLINARY HEART VALVE TEAM  Patient doing well s/p TAVR. She is hemodynamically stable. She required balloon angioplasty of right common femoral artery due to percloses being cinched too tight. Groin sites stable but right side continues to ooze and treated with lido/epi and silver nitrate. If it doesn't stop, will require fem stop placement at 20 mm hg. Initial plan was to start IV heparin without a bolus given balloon angioplasty but will hold off given persistent groin oozing. ECG with sinus and no high grade block. Plan to transfer to from cath lab holding to 4E when bed available.  Early ambulation after bedrest completed and hopeful discharge over the next 24-48 hours.    If bleeding controlled at right radial and right groin, Dr. Lynnette Caffey would like heparin gtt without bolus to be started a 8pm. If bleeding continues we will need to hold off.   Cline Crock PA-C  [facility]  Pager [phone]

## 2023-06-13 NOTE — Anesthesia Postprocedure Evaluation (Signed)
Anesthesia Post Note  Patient: Autumn Walker  Procedure(s) Performed: Transcatheter Aortic Valve Replacement, Transfemoral with Cerebral Embolic Protection INTRAOPERATIVE TRANSTHORACIC ECHOCARDIOGRAM     Patient location during evaluation: PACU Anesthesia Type: MAC Level of consciousness: awake and alert Pain management: pain level controlled Vital Signs Assessment: post-procedure vital signs reviewed and stable Respiratory status: spontaneous breathing, nonlabored ventilation, respiratory function stable and patient connected to nasal cannula oxygen Cardiovascular status: stable and blood pressure returned to baseline Postop Assessment: no apparent nausea or vomiting Anesthetic complications: yes   Encounter Notable Events  Notable Event Outcome Phase Comment  Other vascular complications requiring treatment  Intraprocedure     Last Vitals:  Vitals:   06/13/23 1515 06/13/23 1536  BP: 119/61 (!) 132/50  Pulse: 60 (!) 59  Resp: 17 12  Temp:  36.7 C  SpO2: 95% 100%    Last Pain:  Vitals:   06/13/23 1536  TempSrc: Oral  PainSc:                  Nelle Don Alane Hanssen

## 2023-06-13 NOTE — Discharge Instructions (Signed)

## 2023-06-13 NOTE — Anesthesia Procedure Notes (Signed)
Procedure Name: MAC Date/Time: 06/13/2023 10:30 AM  Performed by: Little Ishikawa, CRNAPre-anesthesia Checklist: Patient identified, Emergency Drugs available, Suction available, Timeout performed and Patient being monitored Patient Re-evaluated:Patient Re-evaluated prior to induction Oxygen Delivery Method: Simple face mask Induction Type: IV induction Dental Injury: Teeth and Oropharynx as per pre-operative assessment

## 2023-06-13 NOTE — Progress Notes (Signed)
Pt arrived from ...cath.., A/ox 4...pt denies any pain, MD aware,CCMD called. CHG bath given,no further needs at this time   

## 2023-06-13 NOTE — Transfer of Care (Signed)
Immediate Anesthesia Transfer of Care Note  Patient: Autumn Walker  Procedure(s) Performed: Transcatheter Aortic Valve Replacement, Transfemoral with Cerebral Embolic Protection INTRAOPERATIVE TRANSTHORACIC ECHOCARDIOGRAM  Patient Location: Cath Lab  Anesthesia Type:MAC  Level of Consciousness: awake, alert , oriented, and patient cooperative  Airway & Oxygen Therapy: Patient Spontanous Breathing and Patient connected to nasal cannula oxygen  Post-op Assessment: Report given to RN and Post -op Vital signs reviewed and stable  Post vital signs: Reviewed and stable  Last Vitals:  Vitals Value Taken Time  BP 112/57 06/13/23 1345  Temp 36.4 C 06/13/23 1342  Pulse 64 06/13/23 1357  Resp 13 06/13/23 1357  SpO2 90 % 06/13/23 1357  Vitals shown include unfiled device data.  Last Pain:  Vitals:   06/13/23 1342  TempSrc: Oral  PainSc: 0-No pain      Patients Stated Pain Goal: 0 (06/13/23 0759)  Complications:  Encounter Notable Events  Notable Event Outcome Phase Comment  Other vascular complications requiring treatment  Intraprocedure

## 2023-06-14 ENCOUNTER — Inpatient Hospital Stay (HOSPITAL_COMMUNITY): Payer: Medicare HMO

## 2023-06-14 ENCOUNTER — Encounter (HOSPITAL_COMMUNITY): Payer: Self-pay | Admitting: Internal Medicine

## 2023-06-14 DIAGNOSIS — Z952 Presence of prosthetic heart valve: Secondary | ICD-10-CM | POA: Diagnosis not present

## 2023-06-14 DIAGNOSIS — E1122 Type 2 diabetes mellitus with diabetic chronic kidney disease: Secondary | ICD-10-CM | POA: Diagnosis not present

## 2023-06-14 DIAGNOSIS — Z006 Encounter for examination for normal comparison and control in clinical research program: Secondary | ICD-10-CM | POA: Diagnosis not present

## 2023-06-14 DIAGNOSIS — R208 Other disturbances of skin sensation: Secondary | ICD-10-CM | POA: Diagnosis not present

## 2023-06-14 DIAGNOSIS — Z954 Presence of other heart-valve replacement: Secondary | ICD-10-CM | POA: Diagnosis not present

## 2023-06-14 DIAGNOSIS — J439 Emphysema, unspecified: Secondary | ICD-10-CM | POA: Diagnosis not present

## 2023-06-14 DIAGNOSIS — I350 Nonrheumatic aortic (valve) stenosis: Secondary | ICD-10-CM | POA: Diagnosis not present

## 2023-06-14 DIAGNOSIS — I352 Nonrheumatic aortic (valve) stenosis with insufficiency: Secondary | ICD-10-CM | POA: Diagnosis not present

## 2023-06-14 LAB — BASIC METABOLIC PANEL
Anion gap: 5 (ref 5–15)
BUN: 8 mg/dL (ref 8–23)
CO2: 21 mmol/L — ABNORMAL LOW (ref 22–32)
Calcium: 8.3 mg/dL — ABNORMAL LOW (ref 8.9–10.3)
Chloride: 112 mmol/L — ABNORMAL HIGH (ref 98–111)
Creatinine, Ser: 0.76 mg/dL (ref 0.44–1.00)
GFR, Estimated: >60 mL/min (ref >60)
Glucose, Bld: 117 mg/dL — ABNORMAL HIGH (ref 70–99)
Potassium: 4.6 mmol/L (ref 3.5–5.1)
Sodium: 138 mmol/L (ref 135–145)

## 2023-06-14 LAB — CBC
HCT: 34.1 % — ABNORMAL LOW (ref 36.0–46.0)
Hemoglobin: 10.9 g/dL — ABNORMAL LOW (ref 12.0–15.0)
MCH: 32.1 pg (ref 26.0–34.0)
MCHC: 32 g/dL (ref 30.0–36.0)
MCV: 100.3 fL — ABNORMAL HIGH (ref 80.0–100.0)
Platelets: 300 10*3/uL (ref 150–400)
RBC: 3.4 MIL/uL — ABNORMAL LOW (ref 3.87–5.11)
RDW: 13.4 % (ref 11.5–15.5)
WBC: 12.9 10*3/uL — ABNORMAL HIGH (ref 4.0–10.5)
nRBC: 0 % (ref 0.0–0.2)

## 2023-06-14 LAB — ECHOCARDIOGRAM COMPLETE
AR max vel: 2.69 cm2
AV Area VTI: 2.66 cm2
AV Area mean vel: 2.72 cm2
AV Mean grad: 5 mm[Hg]
AV Peak grad: 9.2 mm[Hg]
Ao pk vel: 1.52 m/s
Area-P 1/2: 2.01 cm2
Calc EF: 66.9 %
Height: 61.5 in
MV M vel: 1.68 m/s
MV Peak grad: 11.3 mm[Hg]
MV VTI: 1.97 cm2
S' Lateral: 2.2 cm
Single Plane A2C EF: 65.9 %
Single Plane A4C EF: 66.7 %
Weight: 1922.41 [oz_av]

## 2023-06-14 LAB — GLUCOSE, CAPILLARY
Glucose-Capillary: 103 mg/dL — ABNORMAL HIGH (ref 70–99)
Glucose-Capillary: 112 mg/dL — ABNORMAL HIGH (ref 70–99)

## 2023-06-14 LAB — MAGNESIUM: Magnesium: 1.8 mg/dL (ref 1.7–2.4)

## 2023-06-14 MED ORDER — HEPARIN (PORCINE) 25000 UT/250ML-% IV SOLN: 600.0000 [IU]/h | INTRAVENOUS | Status: DC

## 2023-06-14 NOTE — Progress Notes (Signed)
CARDIAC REHAB PHASE I   PRE:  Rate/Rhythm: 92 SR   BP:  Sitting: 126/88      SaO2: 98 RA   MODE:  Ambulation: 220 ft   POST:  Rate/Rhythm: 102 ST   BP:  Sitting: 105/49      SaO2: 97 RA   Pt ambulated independently in hallway. Tolerated well with no pain, SOB or dizziness. Returned to bed with call bell and bedside table in reach. Post TAVR education including site care, restrictions, heart healthy diabetic diet, exercise guidelines, smoking cessation and CRP2 reviewed. All questions and concerns addressed. Pt not interested in CRP2 at this time. Plan for home today.   [phone]  Woodroe Chen, RN BSN 06/14/2023 11:13 AM

## 2023-06-14 NOTE — Progress Notes (Signed)
Pt s/p TAVR, stable for transition home today, family to transport home- no TOC needs noted    06/14/23 1113  TOC Brief Assessment  Insurance and Status Reviewed  Patient has primary care physician Yes  Home environment has been reviewed home with spouse  Prior level of function: independent  Prior/Current Home Services No current home services  Social Determinants of Health Reivew SDOH reviewed no interventions necessary  Readmission risk has been reviewed Yes  Transition of care needs no transition of care needs at this time

## 2023-06-14 NOTE — Progress Notes (Signed)
  Echocardiogram 2D Echocardiogram has been performed.  Autumn Walker 06/14/2023, 10:00 AM

## 2023-06-14 NOTE — Progress Notes (Addendum)
[facility]  Subjective  - No new issues, still has numbness in the right foot-not changed just feels different   Objective (!) 111/46 76 98 F (36.7 C) (Oral) 15 91%  Intake/Output Summary (Last 24 hours) at 06/14/2023 0744 Last data filed at 06/13/2023 1315 Gross per 24 hour  Intake 1400 ml  Output 400 ml  Net 1000 ml    Doppler weak AT, DP/PT intact sounds biphasic, left is brisk and multiphasic Groins are soft Lungs no labored breathing    Assessment/Planning: Post TAVR Right common femoral artery stenosis   NPO until Dr. Randie Heinz re examines  Left LE multiphasic, right LE weak AT, biphasic DP/PT  Heaprin was not started last night secondary to bloody drainage at the groin stick site.  The heparin will be started this am.     Mosetta Pigeon 06/14/2023 7:44 AM --  Laboratory Lab Results: Recent Labs    06/13/23 1518 06/14/23 0331  WBC  --  12.9*  HGB 11.6* 10.9*  HCT 34.0* 34.1*  PLT  --  300   BMET Recent Labs    06/13/23 1518 06/14/23 0331  NA 145 138  K 4.2 4.6  CL 111 112*  CO2  --  21*  GLUCOSE 135* 117*  BUN 8 8  CREATININE 0.70 0.76  CALCIUM  --  8.3*    COAG Lab Results  Component Value Date   INR 0.9 06/09/2023   INR 0.9 04/24/2023   No results found for: "PTT"   I have independently interviewed and examined patient and agree with PA assessment and plan above.  Her right foot is very warm and well-perfused with brisk capillary refill and brisk although monophasic dorsalis pedis and posterior tibial signals and is sensorimotor intact although she does have some weird feeling she states that his skin.  This may be hematoma around the femoral nerve is much as anything given the strong perfusion of the foot I would not recommend any common femoral intervention at this time.  I will have her follow-up in 3 to 4 weeks with me with ABIs to evaluate certainly if she has issues before that I will see her  sooner.  Octavia Mottola C. Randie Heinz, MD [facility] Office: [phone] Pager: [phone]

## 2023-06-14 NOTE — Progress Notes (Addendum)
PHARMACY - ANTICOAGULATION CONSULT NOTE  Pharmacy Consult for Heparin Indication:  acute limb ischemia  Allergies  Allergen Reactions   Statins     Severe leg cramps   Codeine Nausea Only    Abdominal pain.   Patient Measurements: Height: 5' 1.5" (156.2 cm) Weight: 54.5 kg (120 lb 2.4 oz) IBW/kg (Calculated) : 48.95 Heparin Dosing Weight: 54.4 kg  Vital Signs: Temp: 98 F (36.7 C) (12/04 0300) Temp Source: Oral (12/04 0300) BP: 111/46 (12/04 0600) Pulse Rate: 76 (12/04 0600)  Labs: Recent Labs    06/13/23 1518 06/14/23 0331  HGB 11.6* 10.9*  HCT 34.0* 34.1*  PLT  --  300  CREATININE 0.70 0.76    Estimated Creatinine Clearance: 49.9 mL/min (by C-G formula based on SCr of 0.76 mg/dL).   Medical History: Past Medical History:  Diagnosis Date   Allergy    Blood transfusion without reported diagnosis    Cataract    Diabetes mellitus without complication (HCC)    S/P TAVR (transcatheter aortic valve replacement) 06/13/2023   s/p TAVR with a 29 mm Edwards S3UR via the TF approach by Dr. Lynnette Caffey and Leafy Ro   Severe aortic stenosis    Stroke [facility])    Medications:  Infusions:   sodium chloride     nitroGLYCERIN      Assessment: 72YOF s/p TAVR found to have occlusion of R common femoral artery during the procedure, which was subsequently ballooned open. Dorsalis pedis and posterior tibial signals are somewhat sluggish, therefore may undergo common femoral endarterectomy. Heparin not initiated last night 2/2 bloody drainage at groin stick site. No issues noted this AM. Pharmacy has been consulted to dose heparin without a bolus.  Hgb 10.9, plts WNL. Plan to dose less aggressively given patient is post-procedure and experienced bloody drainage.  Goal of Therapy:  Heparin level 0.3-0.5 units/ml Monitor platelets by anticoagulation protocol: Yes   Plan:  Start heparin infusion at 600 units/hr Check anti-Xa level in 8 hours and daily while on heparin Continue to  monitor H&H and platelets  [name] Kindred, PharmD PGY1 Pharmacy Resident 06/14/2023 7:56 AM  ADDENDUM: MD would like to hold off on hep gtt after speaking with Dr. Randie Heinz. Heparin drip, labs, and consult dc'd.  [name] Kindred, PharmD PGY1 Pharmacy Resident 06/14/2023 8:10 AM

## 2023-06-15 ENCOUNTER — Telehealth: Payer: Self-pay

## 2023-06-15 ENCOUNTER — Telehealth: Payer: Self-pay | Admitting: Physician Assistant

## 2023-06-15 NOTE — Transitions of Care (Post Inpatient/ED Visit) (Signed)
06/15/2023  Name: Autumn Walker MRN: [id number] DOB: Sep 12, 1950  Today's TOC FU Call Status: Today's TOC FU Call Status:: Successful TOC FU Call Completed TOC FU Call Complete Date: 06/15/23 Patient's Name and Date of Birth confirmed.  Transition Care Management Follow-up Telephone Call Date of Discharge: 06/14/23 Discharge Facility: [facility]) Type of Discharge: Inpatient Admission Primary Inpatient Discharge Diagnosis:: S/P TAVR (transcatheter aortic valve replacement) How have you been since you were released from the hospital?: Better Any questions or concerns?: No  Items Reviewed: Did you receive and understand the discharge instructions provided?: Yes Medications obtained,verified, and reconciled?: Yes (Medications Reviewed) Any new allergies since your discharge?: No Dietary orders reviewed?: Yes Type of Diet Ordered:: diabetic Do you have support at home?: Yes People in Home: spouse, sibling(s), child(ren), adult  Medications Reviewed Today: Medications Reviewed Today     Reviewed by Earlie Server, RN (Registered Nurse) on 06/15/23 at 1107  Med List Status: <None>   Medication Order Taking? Sig Documenting Provider Last Dose Status Informant  acetaminophen (TYLENOL) 500 MG tablet [id number] Yes Take 1,000 mg by mouth every 6 (six) hours as needed (pain.). [provider] Taking Active Self  albuterol (VENTOLIN HFA) 108 (90 Base) MCG/ACT inhaler [id number] Yes INHALE 1-2 PUFFS INTO THE LUNGS EVERY 4 (FOUR) HOURS AS NEEDED FOR WHEEZING OR SHORTNESS OF BREATH. Mayer Masker, PA-C Taking Active Self  amoxicillin (AMOXIL) 875 MG tablet [id number] No Take 875 mg by mouth 2 (two) times daily.  Patient not taking: Reported on 06/15/2023   [provider] Not Taking Active Self           Med Note Sueanne Margarita Jun 06, 2023  1:09 PM) 10 day therapy course patient to complete on 06/08/2023  aspirin 81 MG chewable tablet [id number] Yes  Chew 1 tablet (81 mg total) by mouth daily.  Patient taking differently: Chew 81 mg by mouth at bedtime.   [name], MD Taking Active Self  calcium carbonate (TUMS EX) 750 MG chewable tablet [id number] Yes Chew 2 tablets by mouth daily as needed for heartburn (Indigestion). [provider] Taking Active Self  clopidogrel (PLAVIX) 75 MG tablet [id number] Yes Take 1 tablet (75 mg total) by mouth daily.  Patient taking differently: Take 75 mg by mouth at bedtime.   [name], MD Taking Active Self  ezetimibe (ZETIA) 10 MG tablet [id number] Yes TAKE 1 TABLET (10 MG TOTAL) BY MOUTH DAILY AFTER SUPPER. [name], PA Taking Active Self           Med Note [name]   Fri May 12, 2023 11:15 AM)    fluticasone (FLONASE) 50 MCG/ACT nasal spray [id number] Yes USE 1 SPRAY IN EACH NOSTRIL TWICE DAILY FOLLOWING SINUS RINSES  Patient taking differently: as needed. USE 1 SPRAY IN EACH NOSTRIL TWICE DAILY FOLLOWING SINUS RINSES   Thomasene Lot, DO Taking Active Self           Med Note [name]   Fri May 12, 2023 11:15 AM)    gabapentin (NEURONTIN) 300 MG capsule [id number] Yes Take 1 capsule (300 mg total) by mouth in the morning AND 1 capsule (300 mg total) daily in the afternoon AND 2 capsules (600 mg total) at bedtime. [name], PA Taking Active Self  metFORMIN (GLUCOPHAGE) 1000 MG tablet [id number] Yes Take 1 tablet (1,000 mg total) by mouth 2 (two) times daily with a meal. [name], PA  Taking Active Self  OVER THE COUNTER MEDICATION [id number] Yes Take 2-3 tablets by mouth every 4 (four) hours as needed (leg cramps). : Medication name-Hyland's Homeopathic:  Place 2-3 tabs under tongue every 4 hours . If needed, place 2-3 additional tablets under the tongue every 15 mins for up to 6 doses. [provider] Taking Active Self           Med Note [name] Apr 24, 2023  8:06 PM)    Vitamin D, Ergocalciferol, (DRISDOL) 1.25 MG (50000  UNIT) CAPS capsule [id number] Yes Take 50,000 Units by mouth every Sunday. [provider] Taking Active Self            Home Care and Equipment/Supplies: Were Home Health Services Ordered?: No Any new equipment or medical supplies ordered?: No  Functional Questionnaire: Do you need assistance with bathing/showering or dressing?: No Do you need assistance with meal preparation?: No Do you need assistance with eating?: No Do you have difficulty maintaining continence: No Do you need assistance with getting out of bed/getting out of a chair/moving?: No Do you have difficulty managing or taking your medications?: No  Follow up appointments reviewed: PCP Follow-up appointment confirmed?: Yes Date of PCP follow-up appointment?: 06/21/23 Follow-up Provider: PCP Specialist Hospital Follow-up appointment confirmed?: Yes Date of Specialist follow-up appointment?: 06/21/23 Follow-Up Specialty Provider:: cardiology Do you need transportation to your follow-up appointment?: No Do you understand care options if your condition(s) worsen?: Yes-patient verbalized understanding  SDOH Interventions Today    Flowsheet Row Most Recent Value  SDOH Interventions   Food Insecurity Interventions Intervention Not Indicated  Housing Interventions Intervention Not Indicated  Transportation Interventions Intervention Not Indicated  Utilities Interventions Intervention Not Indicated      Patient reports that she is doing well. Reviewed discharge plan.  Patient denies any concerns today. Reviewed and offered 30 day TOC program and patient agreed.    Goals Addressed               This Visit's Progress     Patient will report no readmissions to the hosptial in the next 30 days. (pt-stated)        Current Barriers:  Knowledge deficit about new arotic valve replacement   Leg pain and numbness post op.  Today denies any leg pain, no chest pain and no shortness of breath, Reports that she is  doing well.   RNCM Clinical Goal(s):  Patient will work with the Care Management team over the next 30 days to address Transition of Care Barriers: understanding recent surgery  and follow up. take all medications exactly as prescribed and will call provider for medication related questions as evidenced by patient report and review of EMR. attend all scheduled medical appointments: PCP and specialist as evidenced by patient report and review of EMR  through collaboration with RN Care manager, provider, and care team.   Interventions: Evaluation of current treatment plan related to  self management and patient's adherence to plan as established by provider  Transitions of Care:  New goal. Labs education provided regarding need for exam Doctor Visits  - discussed the importance of doctor visits Encouraged patient to call MD for changes in condition of any other concerns. Agreed to 30 day TOC program.  Provided my contact information if patient needs to call me sooner than scheduled appointment.  Patient Goals/Self-Care Activities: Participate in Transition of Care Program/Attend [facility] scheduled calls Notify RN Care Manager of TOC call rescheduling needs Take all  medications as prescribed Attend all scheduled provider appointments  Follow Up Plan:  Telephone follow up appointment with care management team member scheduled for:  06/22/2023        Stay Healthy (pt-stated)          Lonia Chimera, RN, BSN, CEN Population Health- Transition of Care Team.  [facility] [phone]

## 2023-06-15 NOTE — Telephone Encounter (Signed)
  HEART AND VASCULAR CENTER   MULTIDISCIPLINARY HEART VALVE TEAM   Patient contacted regarding discharge from [facility] on 12/4  Patient understands to follow up with a structural heart APP on 12/11 at [address] [facility].  Patient understands discharge instructions? yes Patient understands medications and regimen? yes Patient understands to bring all medications to this visit? Yes  Foot is feeling better. No complaints.   Cline Crock PA-C  [facility]

## 2023-06-16 MED FILL — Protamine Sulfate Inj 10 MG/ML: INTRAVENOUS | Qty: 5 | Status: AC
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
## 2023-06-20 ENCOUNTER — Emergency Department (HOSPITAL_COMMUNITY)
Admission: EM | Admit: 2023-06-20 | Discharge: 2023-06-20 | Disposition: A | Discharge status: Home / Self Care | Payer: Medicare HMO

## 2023-06-20 ENCOUNTER — Emergency Department (HOSPITAL_COMMUNITY): Payer: Medicare HMO

## 2023-06-20 ENCOUNTER — Other Ambulatory Visit: Payer: Medicare HMO

## 2023-06-20 ENCOUNTER — Telehealth: Payer: Self-pay | Admitting: Cardiovascular Disease

## 2023-06-20 ENCOUNTER — Other Ambulatory Visit: Payer: Self-pay

## 2023-06-20 DIAGNOSIS — Z7982 Long term (current) use of aspirin: Secondary | ICD-10-CM | POA: Insufficient documentation

## 2023-06-20 DIAGNOSIS — I700 Atherosclerosis of aorta: Secondary | ICD-10-CM | POA: Diagnosis not present

## 2023-06-20 DIAGNOSIS — R0602 Shortness of breath: Secondary | ICD-10-CM | POA: Diagnosis not present

## 2023-06-20 DIAGNOSIS — Z7984 Long term (current) use of oral hypoglycemic drugs: Secondary | ICD-10-CM | POA: Diagnosis not present

## 2023-06-20 DIAGNOSIS — R42 Dizziness and giddiness: Secondary | ICD-10-CM | POA: Diagnosis not present

## 2023-06-20 DIAGNOSIS — R531 Weakness: Secondary | ICD-10-CM | POA: Insufficient documentation

## 2023-06-20 DIAGNOSIS — R002 Palpitations: Secondary | ICD-10-CM | POA: Insufficient documentation

## 2023-06-20 DIAGNOSIS — Z1152 Encounter for screening for COVID-19: Secondary | ICD-10-CM | POA: Diagnosis not present

## 2023-06-20 DIAGNOSIS — E119 Type 2 diabetes mellitus without complications: Secondary | ICD-10-CM | POA: Insufficient documentation

## 2023-06-20 DIAGNOSIS — R079 Chest pain, unspecified: Secondary | ICD-10-CM | POA: Diagnosis not present

## 2023-06-20 LAB — CBC
HCT: 36.5 % (ref 36.0–46.0)
Hemoglobin: 12.1 g/dL (ref 12.0–15.0)
MCH: 33.6 pg (ref 26.0–34.0)
MCHC: 33.2 g/dL (ref 30.0–36.0)
MCV: 101.4 fL — ABNORMAL HIGH (ref 80.0–100.0)
Platelets: 318 10*3/uL (ref 150–400)
RBC: 3.6 MIL/uL — ABNORMAL LOW (ref 3.87–5.11)
RDW: 13.8 % (ref 11.5–15.5)
WBC: 9.9 10*3/uL (ref 4.0–10.5)
nRBC: 0 % (ref 0.0–0.2)

## 2023-06-20 LAB — URINALYSIS, ROUTINE W REFLEX MICROSCOPIC
Bacteria, UA: NONE SEEN
Bilirubin Urine: NEGATIVE
Glucose, UA: NEGATIVE mg/dL
Ketones, ur: NEGATIVE mg/dL
Leukocytes,Ua: NEGATIVE
Nitrite: NEGATIVE
Protein, ur: NEGATIVE mg/dL
Specific Gravity, Urine: 1.01 (ref 1.005–1.030)
pH: 5 (ref 5.0–8.0)

## 2023-06-20 LAB — COMPREHENSIVE METABOLIC PANEL
ALT: 11 U/L (ref 0–44)
AST: 18 U/L (ref 15–41)
Albumin: 3 g/dL — ABNORMAL LOW (ref 3.5–5.0)
Alkaline Phosphatase: 47 U/L (ref 38–126)
Anion gap: 12 (ref 5–15)
BUN: 13 mg/dL (ref 8–23)
CO2: 21 mmol/L — ABNORMAL LOW (ref 22–32)
Calcium: 9.4 mg/dL (ref 8.9–10.3)
Chloride: 103 mmol/L (ref 98–111)
Creatinine, Ser: 0.75 mg/dL (ref 0.44–1.00)
GFR, Estimated: >60 mL/min (ref >60)
Glucose, Bld: 147 mg/dL — ABNORMAL HIGH (ref 70–99)
Potassium: 3.7 mmol/L (ref 3.5–5.1)
Sodium: 136 mmol/L (ref 135–145)
Total Bilirubin: 0.3 mg/dL (ref <1.2)
Total Protein: 6.7 g/dL (ref 6.5–8.1)

## 2023-06-20 LAB — RESP PANEL BY RT-PCR (RSV, FLU A&B, COVID)  RVPGX2
Influenza A by PCR: NEGATIVE
Influenza B by PCR: NEGATIVE
Resp Syncytial Virus by PCR: NEGATIVE
SARS Coronavirus 2 by RT PCR: NEGATIVE

## 2023-06-20 LAB — BRAIN NATRIURETIC PEPTIDE: B Natriuretic Peptide: 29.2 pg/mL (ref 0.0–100.0)

## 2023-06-20 LAB — TROPONIN I (HIGH SENSITIVITY)
Troponin I (High Sensitivity): 6 ng/L (ref <18)
Troponin I (High Sensitivity): 5 ng/L (ref <18)

## 2023-06-20 MED ORDER — IOHEXOL 350 MG/ML SOLN
75.0000 mL | Freq: Once | INTRAVENOUS | Status: AC | PRN
  Administered 2023-06-20: 75 mL via INTRAVENOUS

## 2023-06-20 NOTE — Telephone Encounter (Signed)
Will follow hospital notes.

## 2023-06-20 NOTE — Telephone Encounter (Signed)
Call from patient who states she had an aortic valve replacement on 06/13/23. She became weak last night and developed palpitations this morning. She had labs at her PCP's office and presented there but was told to present to ER or call cardiology office. Patient states she has now been SOB for the last hour. Patient denies chest pain and states she has not checked her HR or BP recently at home or otherwise. I advised patient to proceed to ER for evaluation. Patient stated she was already in the [facility] ER parking lot.

## 2023-06-20 NOTE — ED Notes (Signed)
Patient endorsing increased dizziness and palpitations. VSS. Radials pulses regular.

## 2023-06-20 NOTE — Discharge Instructions (Signed)
Your workup today was reassuring.  I discussed your case with cardiology who reviewed your workup and thinks that you are safe to follow-up tomorrow as scheduled.  Please follow-up as scheduled and return to the ER for worsening symptoms.
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
## 2023-06-20 NOTE — ED Provider Notes (Signed)
[place] EMERGENCY DEPARTMENT AT [facility] Provider Note   CSN: [id number] Arrival date & time: 06/20/23  1020     History  Chief Complaint  Patient presents with   Chest Pain   Shortness of Breath   Dizziness   Weakness   Palpitations    Autumn Walker is a 72 y.o. female.  72 year old female with recent TAVR on 3 December as well as diabetes presenting to the emergency department today with palpitations and lightheadedness.  The patient states that she has been having intermittent palpitations and lightheadedness over the past few days.  She states that she did have some pain over the left lateral chest wall last night.  She reports this was nonpleuritic.  She does report occasional shortness of breath with this.  She feels generally weak with this.  She denies any focal weakness, numbness, or tingling.  She denies any blood in her stool or dark stools.  She came to the ER today for further evaluation regarding this.  Denies any fevers, chills, or cough.  She denies a history of DVT or pulmonary embolism, recent surgeries, recent travel.   Chest Pain Associated symptoms: dizziness, palpitations, shortness of breath and weakness   Shortness of Breath Associated symptoms: chest pain   Dizziness Associated symptoms: chest pain, palpitations, shortness of breath and weakness   Weakness Associated symptoms: chest pain, dizziness and shortness of breath   Palpitations Associated symptoms: chest pain, dizziness, shortness of breath and weakness        Home Medications Prior to Admission medications   Medication Sig Start Date End Date Taking? Authorizing Provider  acetaminophen (TYLENOL) 500 MG tablet Take 1,000 mg by mouth every 6 (six) hours as needed (pain.).   Yes [provider]  albuterol (VENTOLIN HFA) 108 (90 Base) MCG/ACT inhaler INHALE 1-2 PUFFS INTO THE LUNGS EVERY 4 (FOUR) HOURS AS NEEDED FOR WHEEZING OR SHORTNESS OF BREATH. 08/17/20   Yes Abonza, Maritza, PA-C  aspirin 81 MG chewable tablet Chew 1 tablet (81 mg total) by mouth daily. Patient taking differently: Chew 81 mg by mouth at bedtime. 04/27/23  Yes Hongalgi, Maximino Greenland, MD  calcium carbonate (TUMS EX) 750 MG chewable tablet Chew 2 tablets by mouth daily as needed for heartburn (Indigestion).   Yes [provider]  clopidogrel (PLAVIX) 75 MG tablet Take 1 tablet (75 mg total) by mouth daily. Patient taking differently: Take 75 mg by mouth at bedtime. 04/27/23  Yes Hongalgi, Maximino Greenland, MD  ezetimibe (ZETIA) 10 MG tablet TAKE 1 TABLET (10 MG TOTAL) BY MOUTH DAILY AFTER SUPPER. 04/24/23  Yes Edstrom, Morgan A, PA  fluticasone (FLONASE) 50 MCG/ACT nasal spray USE 1 SPRAY IN EACH NOSTRIL TWICE DAILY FOLLOWING SINUS RINSES Patient taking differently: as needed. USE 1 SPRAY IN EACH NOSTRIL TWICE DAILY FOLLOWING SINUS RINSES 02/05/19  Yes [name], Thurley, DO  gabapentin (NEURONTIN) 300 MG capsule Take 1 capsule (300 mg total) by mouth in the morning AND 1 capsule (300 mg total) daily in the afternoon AND 2 capsules (600 mg total) at bedtime. 05/15/23  Yes [name] A, PA  metFORMIN (GLUCOPHAGE) 1000 MG tablet Take 1 tablet (1,000 mg total) by mouth 2 (two) times daily with a meal. 01/13/23  Yes Edstrom, Morgan A, PA  OVER THE COUNTER MEDICATION Take 2-3 tablets by mouth every 4 (four) hours as needed (leg cramps). : Medication name-[facility] Homeopathic:  Place 2-3 tabs under tongue every 4 hours . If needed, place 2-3 additional tablets  under the tongue every 15 mins for up to 6 doses.   Yes [provider]  rosuvastatin (CRESTOR) 20 MG tablet Take 20 mg by mouth daily.   Yes [provider]  Vitamin D, Ergocalciferol, (DRISDOL) 1.25 MG (50000 UNIT) CAPS capsule Take 50,000 Units by mouth every Sunday.   Yes [provider]      Allergies    Statins and Codeine    Review of Systems   Review of Systems  Respiratory:  Positive for shortness of  breath.   Cardiovascular:  Positive for chest pain and palpitations.  Neurological:  Positive for dizziness and weakness.  All other systems reviewed and are negative.   Physical Exam Updated Vital Signs BP (!) 104/58   Pulse 82   Temp 98.1 F (36.7 C) (Oral)   Resp 14   SpO2 99%  Physical Exam Vitals and nursing note reviewed.   Gen: NAD Eyes: PERRL, EOMI HEENT: no oropharyngeal swelling Neck: trachea midline Resp: clear to auscultation bilaterally Card: RRR, no murmurs, rubs, or gallops Abd: nontender, nondistended Extremities: no calf tenderness, no edema Vascular: 2+ radial pulses bilaterally, 2+ DP pulses bilaterally Neuro: No focal deficits Skin: no rashes Psyc: acting appropriately   ED Results / Procedures / Treatments   Labs (all labs ordered are listed, but only abnormal results are displayed) Labs Reviewed  CBC - Abnormal; Notable for the following components:      Result Value   RBC 3.60 (*)    MCV 101.4 (*)    All other components within normal limits  COMPREHENSIVE METABOLIC PANEL - Abnormal; Notable for the following components:   CO2 21 (*)    Glucose, Bld 147 (*)    Albumin 3.0 (*)    All other components within normal limits  URINALYSIS, ROUTINE W REFLEX MICROSCOPIC - Abnormal; Notable for the following components:   Hgb urine dipstick SMALL (*)    All other components within normal limits  RESP PANEL BY RT-PCR (RSV, FLU A&B, COVID)  RVPGX2  BRAIN NATRIURETIC PEPTIDE  TROPONIN I (HIGH SENSITIVITY)  TROPONIN I (HIGH SENSITIVITY)    EKG None  Radiology CT Angio Chest PE W and/or Wo Contrast  Result Date: 06/20/2023 CLINICAL DATA:  Concern for pulmonary embolism. EXAM: CT ANGIOGRAPHY CHEST WITH CONTRAST TECHNIQUE: Multidetector CT imaging of the chest was performed using the standard protocol during bolus administration of intravenous contrast. Multiplanar CT image reconstructions and MIPs were obtained to evaluate the vascular anatomy.  RADIATION DOSE REDUCTION: This exam was performed according to the departmental dose-optimization program which includes automated exposure control, adjustment of the mA and/or kV according to patient size and/or use of iterative reconstruction technique. CONTRAST:  75mL OMNIPAQUE IOHEXOL 350 MG/ML SOLN COMPARISON:  Chest CT dated 05/31/2023. FINDINGS: Cardiovascular: There is no cardiomegaly or pericardial effusion. Aortic valve repair. There is mild atherosclerotic calcification of the thoracic aorta. No aneurysmal dilatation. No periaortic fluid collection. No pulmonary artery embolus identified. Mediastinum/Nodes: No hilar or mediastinal adenopathy. The esophagus and the thyroid gland are grossly unremarkable. No mediastinal fluid collection. Lungs/Pleura: No focal consolidation, pleural effusion, pneumothorax. The central airways are patent. Upper Abdomen: No acute abnormality. Musculoskeletal: No acute osseous pathology. Review of the MIP images confirms the above findings. IMPRESSION: 1. No acute intrathoracic pathology. No CT evidence of pulmonary artery embolus. 2.  Aortic Atherosclerosis (ICD10-I70.0). Electronically Signed   By: Elgie Collard M.D.   On: 06/20/2023 18:49   DG Chest 2 View  Result Date: 06/20/2023 CLINICAL  DATA:  Chest pain EXAM: CHEST - 2 VIEW COMPARISON:  06/09/2023 FINDINGS: Lungs are clear, mildly hyperinflated. No pneumothorax. Post interval AVR. Heart size and mediastinal contours are within normal limits. Aortic Atherosclerosis (ICD10-170.0). No effusion. Visualized bones unremarkable. IMPRESSION: No acute cardiopulmonary disease. Electronically Signed   By: Corlis Leak M.D.   On: 06/20/2023 13:18    Procedures Procedures    Medications Ordered in ED Medications  iohexol (OMNIPAQUE) 350 MG/ML injection 75 mL (75 mLs Intravenous Contrast Given 06/20/23 1749)    ED Course/ Medical Decision Making/ A&P                                 Medical Decision  Making 72 year old female with past medical history of diabetes and recent TAVR presenting to the emergency department today with left lateral chest pain yesterday as well as palpitations, lightheadedness, and shortness of breath.  I will further evaluate patient here with basic labs W[place] and EKG, chest x-ray, and troponin for further evaluation for ACS, pulmonary edema, pulmonary infiltrates, or pneumothorax.  For initial workup is unrevealing will consider further workup for pulmonary embolism.  The above workup will also evaluate for anemia or electrolyte abnormalities.  I will reevaluate for ultimate disposition and discuss her case with cardiology after workup is complete.  The patient initial workup is reassuring.  It is unrevealing.  This does make pulmonary embolism higher probability at this time given the left lateral chest pain.  Will go forward with a CT angiogram.  This will also evaluate for pericardial effusion as well.  The patient CT angiogram is unremarkable.  She had 2 troponins here that were negative.  She was called and discussed with cardiology.  They recommend discharge with outpatient follow-up.  She is discharged with return precautions.  Amount and/or Complexity of Data Reviewed Radiology: ordered.  Risk Prescription drug management.           Final Clinical Impression(s) / ED Diagnoses Final diagnoses:  Palpitations    Rx / DC Orders ED Discharge Orders     None         Durwin Glaze, MD 06/20/23 2101

## 2023-06-20 NOTE — ED Triage Notes (Signed)
Pt. Stated, I didn't feel good for the last 2 days but last night I was feeling weak, dizzy, palpitations and SOB .

## 2023-06-20 NOTE — ED Provider Triage Note (Signed)
Emergency Medicine Provider Triage Evaluation Note  Autumn Walker , a 72 y.o. female  was evaluated in triage.  Pt complains of chest pain, shortness of breath and weakness.  Recent aortic valve replacement.  Worsening symptoms over the past day or 2.  Review of Systems  Positive: Syncope Negative: Nausea vomiting diarrhea  Physical Exam  BP (!) 114/51 (BP Location: Right Arm)   Pulse (!) 102   Temp 98.5 F (36.9 C) (Oral)   Resp 16   SpO2 99%  Gen:   Awake, no distress   Resp:  Normal effort  MSK:   Moves extremities without difficulty   Medical Decision Making  Medically screening exam initiated at 10:47 AM.  Appropriate orders placed.  Autumn Walker was informed that the remainder of the evaluation will be completed by another provider, this initial triage assessment does not replace that evaluation, and the importance of remaining in the ED until their evaluation is complete.  72 year old female with recent aortic valve replacement presenting to the emergency department for generalized weakness, chest pain, shortness of breath and palpitations.  She is well-appearing. Will get cardiac screening labs and CXR.    Coral Spikes, DO 06/20/23 1048

## 2023-06-20 NOTE — ED Notes (Signed)
Pt has returned from CT.  

## 2023-06-20 NOTE — ED Notes (Signed)
Patient transported to CT 

## 2023-06-20 NOTE — Telephone Encounter (Signed)
Attempted to call pt and left a VM to call me back.   Cline Crock PA-C  [facility]

## 2023-06-20 NOTE — Telephone Encounter (Signed)
Patient c/o Palpitations:  STAT if patient reporting lightheadedness, shortness of breath, or chest pain  How long have you had palpitations/irregular HR/ Afib? Are you having the symptoms now? Palpitations, not at this exact second  Are you currently experiencing lightheadedness, SOB or CP? Extremely weak, short of breath off and on- not at this time. Also a little dizzines  Do you have a history of afib (atrial fibrillation) or irregular heart rhythm?   Have you checked your BP or HR? (document readings if available):   Are you experiencing any other symptoms? Went to her general primary- she told her to call cardiologist- to see if she needs to go to the ER or come to the office

## 2023-06-21 ENCOUNTER — Ambulatory Visit: Payer: Medicare HMO

## 2023-06-21 DIAGNOSIS — J449 Chronic obstructive pulmonary disease, unspecified: Secondary | ICD-10-CM

## 2023-06-21 DIAGNOSIS — I70209 Unspecified atherosclerosis of native arteries of extremities, unspecified extremity: Secondary | ICD-10-CM

## 2023-06-21 DIAGNOSIS — Z952 Presence of prosthetic heart valve: Secondary | ICD-10-CM

## 2023-06-21 DIAGNOSIS — K7460 Unspecified cirrhosis of liver: Secondary | ICD-10-CM

## 2023-06-21 DIAGNOSIS — Z8673 Personal history of transient ischemic attack (TIA), and cerebral infarction without residual deficits: Secondary | ICD-10-CM

## 2023-06-22 ENCOUNTER — Ambulatory Visit: Payer: Self-pay | Admitting: Family Medicine

## 2023-06-22 ENCOUNTER — Other Ambulatory Visit: Payer: Self-pay

## 2023-06-22 DIAGNOSIS — B3731 Acute candidiasis of vulva and vagina: Secondary | ICD-10-CM

## 2023-06-22 MED ORDER — FLUCONAZOLE 100 MG PO TABS: 100.0000 mg | ORAL_TABLET | Freq: Once | ORAL | 1 refills | Status: AC

## 2023-06-22 NOTE — Patient Outreach (Signed)
Care Management  Transitions of Care Program Transitions of Care Post-discharge week 2   06/22/2023 Name: Autumn Walker MRN: [id number] DOB: 06-Nov-1950  Subjective: Autumn Walker is a 72 y.o. year old female who is a primary care patient of [name], [place]. The Care Management team Engaged with patient Engaged with patient by telephone to assess and address transitions of care needs.   Consent to Services:  Patient was given information about care management services, agreed to services, and gave verbal consent to participate.   Assessment: Patient reports that she is feeling better. Reports that she is not having any shortness of breath, chest pain, Patient states that she has a yeast infection from the many antibiotics, reports OTC not working.     SDOH Interventions    Flowsheet Row Telephone from 06/15/2023 in [place] POPULATION HEALTH DEPARTMENT Telephone from 04/27/2023 in [facility] Community Care Coordination Clinical Support from 01/17/2023 in [facility]  SDOH Interventions     Food Insecurity Interventions Intervention Not Indicated Intervention Not Indicated Intervention Not Indicated  Housing Interventions Intervention Not Indicated Intervention Not Indicated Intervention Not Indicated  Transportation Interventions Intervention Not Indicated Intervention Not Indicated Intervention Not Indicated  Utilities Interventions Intervention Not Indicated Intervention Not Indicated Intervention Not Indicated  Alcohol Usage Interventions -- -- Intervention Not Indicated (Score <7)  Financial Strain Interventions -- -- Intervention Not Indicated  Physical Activity Interventions -- -- Intervention Not Indicated  Stress Interventions -- -- Intervention Not Indicated  Social Connections Interventions -- -- Intervention Not Indicated  Health Literacy Interventions -- Intervention Not Indicated --        Goals  Addressed               This Visit's Progress     Patient will report no readmissions to the hosptial in the next 30 days. (pt-stated)        Current Barriers:  Knowledge deficit about new arotic valve replacement   Leg pain and numbness post op.  Today denies any leg pain, no chest pain and no shortness of breath, Reports that she is doing well. 06/22/2023  Patient reports that she is doing well today. Reports ED visit for chest pain, dizziness and shortness of breath on 06/20/2023. Yeast infection: Patient reports that she has a yeast infection.  Reports that she is taking OTC medications but she is still having issues.    RNCM Clinical Goal(s):  Patient will work with the Care Management team over the next 30 days to address Transition of Care Barriers: understanding recent surgery  and follow up. take all medications exactly as prescribed and will call provider for medication related questions as evidenced by patient report and review of EMR. attend all scheduled medical appointments: PCP and specialist as evidenced by patient report and review of EMR  through collaboration with RN Care manager, provider, and care team.   Interventions: Evaluation of current treatment plan related to  self management and patient's adherence to plan as established by provider  Transitions of Care:  Goal on track:  Yes. Labs education provided regarding need for exam Doctor Visits  - discussed the importance of doctor visits. Reviewed pending appointment.  Encouraged patient to call MD for changes in condition of any other concerns. Encouraged patient to call MD and inquire about oral medication for yeast Provided my contact information if patient needs to call me sooner than scheduled appointment.  Patient Goals/Self-Care Activities: Participate in Transition  of Care Program/Attend [facility] scheduled calls Notify RN Care Manager of TOC call rescheduling needs Take all medications as prescribed Attend  all scheduled provider appointments  Follow Up Plan:  Telephone follow up appointment with care management team member scheduled for:  06/29/2023          Plan: Telephone follow up appointment with care management team member scheduled for: 06/29/2023   Lonia Chimera, RN, BSN, CEN Population Health- Transition of Care Team.  [facility] [phone]

## 2023-06-22 NOTE — Telephone Encounter (Addendum)
  Chief Complaint: Vaginal Discharge Symptoms: vaginal discharge, itching, pain with urination Frequency: a week since starting antibiotics Pertinent Negatives: Patient denies fever Disposition: [] ED /[] Urgent Care (no appt availability in office) / [] Appointment(In office/virtual)/ []  [place] [facility]/ [] Home Care/ [] Refused Recommended Disposition /[] [place] [facility]/ [x]  Follow-up with PCP Additional Notes: patient calling requesting Diflucan for yeast infection. Patient is demanding prescription to be sent. Patient endorses vaginal discharge, itching and pain with urination. This RN explained patient may need an appointment. Patient became upset and just wants diflucan sent to her pharmacy. Message sent to office and phone call placed to to office to update on patient's request.    Reason for Disposition  [1] Symptoms of a "yeast infection" (i.e., itchy, white discharge, not bad smelling) AND [2] not improved > 3 days following Care Advice  Answer Assessment - Initial Assessment Questions 1. DISCHARGE: "Describe the discharge." (e.g., white, yellow, green, gray, foamy, cottage cheese-like)     "Toilet paper residue" 2. ODOR: "Is there a bad odor?"     no 3. ONSET: "When did the discharge begin?"     Been going on and off since started on antibiotics 4. RASH: "Is there a rash in the genital area?" If Yes, ask: "Describe it." (e.g., redness, blisters, sores, bumps)     rash 5. ABDOMEN PAIN: "Are you having any abdomen pain?" If Yes, ask: "What does it feel like? " (e.g., crampy, dull, intermittent, constant)      No 6. ABDOMEN PAIN SEVERITY: If present, ask: "How bad is it?" (e.g., Scale 1-10; mild, moderate, or severe)   - MILD (1-3): Doesn't interfere with normal activities, abdomen soft and not tender to touch.    - MODERATE (4-7): Interferes with normal activities or awakens from sleep, abdomen tender to touch.    - SEVERE (8-10): Excruciating pain, doubled over, unable to  do any normal activities. (R/O peritonitis)      No 7. CAUSE: "What do you think is causing the discharge?" "Have you had the same problem before? What happened then?"     Yeast infection 8. OTHER SYMPTOMS: "Do you have any other symptoms?" (e.g., fever, itching, vaginal bleeding, pain with urination, injury to genital area, vaginal foreign body)     Itching, pain with urination  Protocols used: Vaginal Discharge-A-AH

## 2023-06-22 NOTE — Telephone Encounter (Signed)
Prescription for fluconazole sent to pharmacy [facility] on [address].  Take 150 mg tablet once.  If symptoms persist 72 hours later, repeat with second dose.

## 2023-06-22 NOTE — Addendum Note (Signed)
Addended by: [name] on: 06/22/2023 04:25 PM   Modules accepted: Orders

## 2023-06-22 NOTE — Patient Instructions (Signed)
Visit Information  Thank you for taking time to visit with me today. Please don't hesitate to contact me if I can be of assistance to you before our next scheduled telephone appointment.  Following are the goals we discussed today:   Goals Addressed               This Visit's Progress     Patient will report no readmissions to the hosptial in the next 30 days. (pt-stated)        Current Barriers:  Knowledge deficit about new arotic valve replacement   Leg pain and numbness post op.  Today denies any leg pain, no chest pain and no shortness of breath, Reports that she is doing well. 06/22/2023  Patient reports that she is doing well today. Reports ED visit for chest pain, dizziness and shortness of breath on 06/20/2023. Yeast infection: Patient reports that she has a yeast infection.  Reports that she is taking OTC medications but she is still having issues.    RNCM Clinical Goal(s):  Patient will work with the Care Management team over the next 30 days to address Transition of Care Barriers: understanding recent surgery  and follow up. take all medications exactly as prescribed and will call provider for medication related questions as evidenced by patient report and review of EMR. attend all scheduled medical appointments: PCP and specialist as evidenced by patient report and review of EMR  through collaboration with RN Care manager, provider, and care team.   Interventions: Evaluation of current treatment plan related to  self management and patient's adherence to plan as established by provider  Transitions of Care:  Goal on track:  Yes. Labs education provided regarding need for exam Doctor Visits  - discussed the importance of doctor visits. Reviewed pending appointment.  Encouraged patient to call MD for changes in condition of any other concerns. Encouraged patient to call MD and inquire about oral medication for yeast Provided my contact information if patient needs to call me  sooner than scheduled appointment.  Patient Goals/Self-Care Activities: Participate in Transition of Care Program/Attend [facility] scheduled calls Notify RN Care Manager of TOC call rescheduling needs Take all medications as prescribed Attend all scheduled provider appointments  Follow Up Plan:  Telephone follow up appointment with care management team member scheduled for:  06/29/2023           Our next appointment is by telephone on 06/29/2023 at 1000  Please call the care guide team at [phone] if you need to cancel or reschedule your appointment.   If you are experiencing a Mental Health or Behavioral Health Crisis or need someone to talk to, please call the Suicide and Crisis Lifeline: 988 call the [place] National Suicide Prevention Lifeline: 607-663-0605 or TTY: 774-174-2309 TTY 580-061-3690) to talk to a trained counselor call 1-800-273-TALK (toll free, 24 hour hotline) go to [facility] [address], [place] [phone]) call 911   Patient verbalizes understanding of instructions and care plan provided today and agrees to view in MyChart. Active MyChart status and patient understanding of how to access instructions and care plan via MyChart confirmed with patient.     Lonia Chimera, RN, BSN, CEN [employer]- Transition of Care Team.  [facility] [phone]

## 2023-06-23 ENCOUNTER — Ambulatory Visit: Payer: Medicare HMO | Attending: Cardiology | Admitting: Physician Assistant

## 2023-06-23 VITALS — BP 112/58 | HR 86 | Ht 61.5 in | Wt 119.8 lb

## 2023-06-23 DIAGNOSIS — I70209 Unspecified atherosclerosis of native arteries of extremities, unspecified extremity: Secondary | ICD-10-CM

## 2023-06-23 DIAGNOSIS — Z8673 Personal history of transient ischemic attack (TIA), and cerebral infarction without residual deficits: Secondary | ICD-10-CM

## 2023-06-23 DIAGNOSIS — J449 Chronic obstructive pulmonary disease, unspecified: Secondary | ICD-10-CM | POA: Diagnosis not present

## 2023-06-23 DIAGNOSIS — K7460 Unspecified cirrhosis of liver: Secondary | ICD-10-CM

## 2023-06-23 DIAGNOSIS — Z952 Presence of prosthetic heart valve: Secondary | ICD-10-CM

## 2023-06-23 NOTE — Patient Instructions (Signed)
Medication Instructions:  Your physician recommends that you continue on your current medications as directed. Please refer to the Current Medication list given to you today.  *If you need a refill on your cardiac medications before your next appointment, please call your pharmacy*   Lab Work: None ordered   If you have labs (blood work) drawn today and your tests are completely normal, you will receive your results only by: [place] (if you have MyChart) OR A paper copy in the mail If you have any lab test that is abnormal or we need to change your treatment, we will call you to review the results.   Testing/Procedures: None ordered    Follow-Up: Follow up as scheduled   Other Instructions

## 2023-06-27 ENCOUNTER — Ambulatory Visit (INDEPENDENT_AMBULATORY_CARE_PROVIDER_SITE_OTHER): Payer: Medicare HMO | Admitting: Family Medicine

## 2023-06-27 ENCOUNTER — Encounter: Payer: Self-pay | Admitting: Family Medicine

## 2023-06-27 VITALS — BP 123/74 | HR 105 | Resp 18 | Ht 61.5 in | Wt 119.0 lb

## 2023-06-27 DIAGNOSIS — E1100 Type 2 diabetes mellitus with hyperosmolarity without nonketotic hyperglycemic-hyperosmolar coma (NKHHC): Secondary | ICD-10-CM | POA: Diagnosis not present

## 2023-06-27 DIAGNOSIS — R55 Syncope and collapse: Secondary | ICD-10-CM | POA: Insufficient documentation

## 2023-06-27 DIAGNOSIS — Z7984 Long term (current) use of oral hypoglycemic drugs: Secondary | ICD-10-CM | POA: Diagnosis not present

## 2023-06-27 DIAGNOSIS — N1832 Chronic kidney disease, stage 3b: Secondary | ICD-10-CM

## 2023-06-27 NOTE — Patient Instructions (Addendum)
I am so sorry for all of the confusion from last week and the frustrating afternoon you had at the ER because of it.  I have updated your chart so that the vasovagal episodes that you experience are now documented with notes about the symptoms that you have during them. Anyone at our office will be able to see this information to ensure that we are all up to date and have this information about your episodes in the future in order to assess and make recommendations accurately. If it happens again, we would be happy to check your blood pressure to verify that it is returning to normal as you are seeing on your blood pressure machine at home!

## 2023-06-27 NOTE — Progress Notes (Signed)
Established Patient Office Visit  Subjective   Patient ID: Autumn Walker, female    DOB: May 21, 1951  Age: 72 y.o. MRN: [id number]  Chief Complaint  Patient presents with   Diabetes    HPI Autumn Walker is a 72 y.o. female presenting today for follow up of diabetes and kidney disease. 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  One episode of weakness, SOB, palpitations, dizziness on 06/20/2023 with unremarkable workup in ER.  Felt totally back to normal at cardiology appointment on 06/23/2023.  Recommended to continue aspirin 81mg  daily and Plavix 75mg  daily, follow-up in January 2025 for cardiology office visit and echocardiogram.  She is upset today because of how the episode of weakness, SOB, palpitations, dizziness on 06/20/2023 was handled.  She presented to primary care office explaining her symptoms and requesting that her blood pressure be checked.  PCP was informed of symptoms and recommended evaluation in the ER being so close to surgery with concern for possible blood clot, PE, surgical complication.  Patient states that she felt very dismissed and was well aware that her symptoms were one of her normal vasovagal episodes that she has been experiencing for many years.  This issue was not notated anywhere in her chart, as a result she spent the afternoon in the ER being worked up.  Outpatient Medications Prior to Visit  Medication Sig   acetaminophen (TYLENOL) 500 MG tablet Take 1,000 mg by mouth every 6 (six) hours as needed (pain.).   albuterol (VENTOLIN HFA) 108 (90 Base) MCG/ACT inhaler INHALE 1-2 PUFFS INTO THE LUNGS EVERY 4 (FOUR) HOURS AS NEEDED FOR WHEEZING OR SHORTNESS OF BREATH.   aspirin 81 MG chewable tablet Chew 1 tablet (81 mg total) by mouth daily. (Patient taking differently: Chew 81 mg by mouth at bedtime.)   calcium carbonate (TUMS EX) 750 MG chewable tablet Chew 2 tablets by mouth daily as needed for heartburn  (Indigestion).   clopidogrel (PLAVIX) 75 MG tablet Take 1 tablet (75 mg total) by mouth daily. (Patient taking differently: Take 75 mg by mouth at bedtime.)   ezetimibe (ZETIA) 10 MG tablet TAKE 1 TABLET (10 MG TOTAL) BY MOUTH DAILY AFTER SUPPER.   fluticasone (FLONASE) 50 MCG/ACT nasal spray USE 1 SPRAY IN EACH NOSTRIL TWICE DAILY FOLLOWING SINUS RINSES (Patient taking differently: as needed. USE 1 SPRAY IN EACH NOSTRIL TWICE DAILY FOLLOWING SINUS RINSES)   gabapentin (NEURONTIN) 300 MG capsule Take 1 capsule (300 mg total) by mouth in the morning AND 1 capsule (300 mg total) daily in the afternoon AND 2 capsules (600 mg total) at bedtime.   metFORMIN (GLUCOPHAGE) 1000 MG tablet Take 1 tablet (1,000 mg total) by mouth 2 (two) times daily with a meal.   OVER THE COUNTER MEDICATION Take 2-3 tablets by mouth every 4 (four) hours as needed (leg cramps). : Medication name-Hyland's Homeopathic:  Place 2-3 tabs under tongue every 4 hours . If needed, place 2-3 additional tablets under the tongue every 15 mins for up to 6 doses.   rosuvastatin (CRESTOR) 20 MG tablet Take 20 mg by mouth daily.   Vitamin D, Ergocalciferol, (DRISDOL) 1.25 MG (50000 UNIT) CAPS capsule Take 50,000 Units by mouth every Sunday.   No facility-administered medications prior to visit.    ROS Negative unless otherwise noted in HPI   Objective:     BP 123/74 (BP Location: Left Arm, Patient Position: Sitting, Cuff Size: Normal)   Pulse (!) 105  Resp 18   Ht 5' 1.5" (1.562 m)   Wt 119 lb (54 kg)   SpO2 96%   BMI 22.12 kg/m   Physical Exam Constitutional:      General: She is not in acute distress.    Appearance: Normal appearance.  HENT:     Head: Normocephalic and atraumatic.  Cardiovascular:     Rate and Rhythm: Normal rate and regular rhythm.     Heart sounds: No murmur heard.    No friction rub. No gallop.  Pulmonary:     Effort: Pulmonary effort is normal. No respiratory distress.     Breath sounds: No  wheezing, rhonchi or rales.  Skin:    General: Skin is warm and dry.  Neurological:     Mental Status: She is alert and oriented to person, place, and time.      Assessment & Plan:  Type 2 diabetes mellitus with hyperosmolarity without coma, without long-term current use of insulin (HCC) Assessment & Plan: Most recent A1c 6.1 on 04/25/2023.  Now that she is recovered from surgery, we will wait about 2 months before checking A1c again between stress of surgery as well as limited ability to take metformin.  Kidney function had improved on most recent CMP, in the future consider switching metformin to SGLT2 or GLP-1 for glycemic control as well as renal protection.   Stage 3b chronic kidney disease (HCC) Assessment & Plan: As of 06/20/2023: Creatinine 0.75, GFR >60.  Previously was taking losartan which was discontinued due to soft blood pressures.  Continue monitoring blood pressure and blood glucose closely.  Consider SGLT2 or GLP-1 for glycemic control and renal protection.   Vasovagal near syncope Assessment & Plan: Patient has occasional episodes of vasovagal response.  During these, she experiences palpitations, dizziness, SOB, weakness, and a decrease in blood pressure.  She does have a blood pressure cuff at home and is able to monitor blood pressure there.  Sometimes, she does like to have her blood pressure verified by PCP when she does have an episode.  Details have been added to chart for more accurate history when assessing patient and making recommendations.     Return in about 2 months (around 08/28/2023) for follow-up for DM, POC A1C at visit.    [name], PA

## 2023-06-27 NOTE — Assessment & Plan Note (Addendum)
Patient has occasional episodes of vasovagal response.  During these, she experiences palpitations, dizziness, SOB, weakness, and a decrease in blood pressure.  She does have a blood pressure cuff at home and is able to monitor blood pressure there.  Sometimes, she does like to have her blood pressure verified by PCP when she does have an episode.  Details have been added to chart for more accurate history when assessing patient and making recommendations.

## 2023-06-27 NOTE — Assessment & Plan Note (Addendum)
As of 06/20/2023: Creatinine 0.75, GFR >60.  Previously was taking losartan which was discontinued due to soft blood pressures.  Continue monitoring blood pressure and blood glucose closely.  Consider SGLT2 or GLP-1 for glycemic control and renal protection.

## 2023-06-27 NOTE — Assessment & Plan Note (Addendum)
Most recent A1c 6.1 on 04/25/2023.  Now that she is recovered from surgery, we will wait about 2 months before checking A1c again between stress of surgery as well as limited ability to take metformin.  Kidney function had improved on most recent CMP, in the future consider switching metformin to SGLT2 or GLP-1 for glycemic control as well as renal protection.

## 2023-06-29 ENCOUNTER — Other Ambulatory Visit: Payer: Self-pay

## 2023-06-29 NOTE — Patient Outreach (Signed)
Care Management  Transitions of Care Program Transitions of Care Post-discharge week 3   06/29/2023 Name: Autumn Walker MRN: [id number] DOB: 16-Mar-1951  Subjective: Autumn Walker is a 72 y.o. year old female who is a primary care patient of [name], [place]. The Care Management team Engaged with patient Engaged with patient by telephone to assess and address transitions of care needs.   Consent to Services:  Patient was given information about care management services, agreed to services, and gave verbal consent to participate.   Assessment: Patient reports that she is going great. Denies any problems today. Has completed her follow up with PCP and is taking all her medications as prescribed.          SDOH Interventions    Flowsheet Row Telephone from 06/15/2023 in [place] POPULATION HEALTH DEPARTMENT Telephone from 04/27/2023 in [facility] Community Care Coordination Clinical Support from 01/17/2023 in [facility]  SDOH Interventions     Food Insecurity Interventions Intervention Not Indicated Intervention Not Indicated Intervention Not Indicated  Housing Interventions Intervention Not Indicated Intervention Not Indicated Intervention Not Indicated  Transportation Interventions Intervention Not Indicated Intervention Not Indicated Intervention Not Indicated  Utilities Interventions Intervention Not Indicated Intervention Not Indicated Intervention Not Indicated  Alcohol Usage Interventions -- -- Intervention Not Indicated (Score <7)  Financial Strain Interventions -- -- Intervention Not Indicated  Physical Activity Interventions -- -- Intervention Not Indicated  Stress Interventions -- -- Intervention Not Indicated  Social Connections Interventions -- -- Intervention Not Indicated  Health Literacy Interventions -- Intervention Not Indicated --        Goals Addressed               This Visit's Progress      COMPLETED: Patient will report no readmissions to the hosptial in the next 30 days. (pt-stated)        Current Barriers:  Knowledge deficit about new arotic valve replacement   Leg pain and numbness post op.  Today denies any leg pain, no chest pain and no shortness of breath, Reports that she is doing well. 06/22/2023  Patient reports that she is doing well today. Reports ED visit for chest pain, dizziness and shortness of breath on 06/20/2023.  06/29/2023  Patient reports that she is feeling great. Reports follow up with cardiology went well and she is currently not having any concerns.  Yeast infection: Patient reports that she has a yeast infection.  Reports that she is taking OTC medications but she is still having issues.   06/29/2023  Patient reports that yeast infection is healing. She continues to take AZO. Reports that she did take her diflucan   RNCM Clinical Goal(s):  Patient will work with the Care Management team over the next 30 days to address Transition of Care Barriers: understanding recent surgery  and follow up. take all medications exactly as prescribed and will call provider for medication related questions as evidenced by patient report and review of EMR. attend all scheduled medical appointments: PCP and specialist as evidenced by patient report and review of EMR  through collaboration with RN Care manager, provider, and care team.   Interventions: Evaluation of current treatment plan related to  self management and patient's adherence to plan as established by provider  Transitions of Care:  Goal Met. 06/29/2023   Patient denies any additional need for followup.  Encouraged patient to continue to follow up with MD's as planned and call MD  for any changes in condition.  Reviewed with patient that she has my contact number to call me if she needs assistance  Patient Goals/Self-Care Activities: Take all medications as prescribed Attend all scheduled provider  appointments  Follow Up Plan:  The patient has been provided with contact information for the care management team and has been advised to call with any health related questions or concerns.   Case closed. Patient denies any need for additional calls.         Plan: The patient has been provided with contact information for the care management team and has been advised to call with any health related questions or concerns.    Lonia Chimera, RN, BSN, CEN [employer]- Transition of Care Team.  [facility] [phone]

## 2023-07-11 ENCOUNTER — Other Ambulatory Visit: Payer: Self-pay | Admitting: Family Medicine

## 2023-07-11 DIAGNOSIS — E1100 Type 2 diabetes mellitus with hyperosmolarity without nonketotic hyperglycemic-hyperosmolar coma (NKHHC): Secondary | ICD-10-CM
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
## 2023-07-14 ENCOUNTER — Ambulatory Visit: Payer: Medicare HMO | Attending: Cardiovascular Disease

## 2023-07-14 ENCOUNTER — Ambulatory Visit: Payer: Medicare HMO | Admitting: Physician Assistant

## 2023-07-14 VITALS — BP 110/60 | HR 80 | Ht 61.5 in | Wt 122.0 lb

## 2023-07-14 DIAGNOSIS — J449 Chronic obstructive pulmonary disease, unspecified: Secondary | ICD-10-CM

## 2023-07-14 DIAGNOSIS — Z952 Presence of prosthetic heart valve: Secondary | ICD-10-CM

## 2023-07-14 DIAGNOSIS — K7460 Unspecified cirrhosis of liver: Secondary | ICD-10-CM | POA: Diagnosis not present

## 2023-07-14 DIAGNOSIS — I70209 Unspecified atherosclerosis of native arteries of extremities, unspecified extremity: Secondary | ICD-10-CM

## 2023-07-14 DIAGNOSIS — Z8673 Personal history of transient ischemic attack (TIA), and cerebral infarction without residual deficits: Secondary | ICD-10-CM

## 2023-07-14 LAB — ECHOCARDIOGRAM COMPLETE
AR max vel: 4.11 cm2
AV Area VTI: 4.31 cm2
AV Area mean vel: 3.97 cm2
AV Mean grad: 5.8 mm[Hg]
AV Peak grad: 10.5 mm[Hg]
Ao pk vel: 1.62 m/s
Area-P 1/2: 4.29 cm2
MV VTI: 3.99 cm2
S' Lateral: 2.2 cm

## 2023-07-14 NOTE — Patient Instructions (Signed)
 Medication Instructions:  Your physician has recommended you make the following change in your medication:  You can stop clopidogrel  (Plavix ) on December 13, 2023.   *If you need a refill on your cardiac medications before your next appointment, please call your pharmacy*   Lab Work: NONE  If you have labs (blood work) drawn today and your tests are completely normal, you will receive your results only by: MyChart Message (if you have MyChart) OR A paper copy in the mail If you have any lab test that is abnormal or we need to change your treatment, we will call you to review the results.   Testing/Procedures: JAN 2026-- -  Your physician has requested that you have an echocardiogram. Echocardiography is a painless test that uses sound waves to create images of your heart. It provides your doctor with information about the size and shape of your heart and how well your heart's chambers and valves are working. This procedure takes approximately one hour. There are no restrictions for this procedure. Please do NOT wear cologne, perfume, aftershave, or lotions (deodorant is allowed). Please arrive 15 minutes prior to your appointment time.  Please note: We ask at that you not bring children with you during ultrasound (echo/ vascular) testing. Due to room size and safety concerns, children are not allowed in the ultrasound rooms during exams. Our front office staff cannot provide observation of children in our lobby area while testing is being conducted. An adult accompanying a patient to their appointment will only be allowed in the ultrasound room at the discretion of the ultrasound technician under special circumstances. We apologize for any inconvenience.    Follow-Up: At [facility], you and your health needs are our priority.  As part of our continuing mission to provide you with exceptional heart care, we have created designated Provider Care Teams.  These Care Teams include your  primary Cardiologist (physician) and Advanced Practice Providers (APPs -  Physician Assistants and Nurse Practitioners) who all work together to provide you with the care you need, when you need it.   Your next appointment:   4 month(s)  Provider:   Maude Emmer, MD  in May and Izetta Hummer in 1 year.

## 2023-07-23 ENCOUNTER — Other Ambulatory Visit: Payer: Self-pay | Admitting: Family Medicine

## 2023-07-23 DIAGNOSIS — E782 Mixed hyperlipidemia: Secondary | ICD-10-CM

## 2023-08-17 ENCOUNTER — Other Ambulatory Visit: Payer: Self-pay | Admitting: Family Medicine

## 2023-08-17 ENCOUNTER — Encounter: Payer: Self-pay | Admitting: Family Medicine

## 2023-08-17 DIAGNOSIS — E1169 Type 2 diabetes mellitus with other specified complication: Secondary | ICD-10-CM

## 2023-08-22 ENCOUNTER — Ambulatory Visit (INDEPENDENT_AMBULATORY_CARE_PROVIDER_SITE_OTHER): Payer: Medicare HMO | Admitting: Family Medicine

## 2023-08-22 ENCOUNTER — Encounter: Payer: Self-pay | Admitting: Family Medicine

## 2023-08-22 VITALS — BP 90/53 | HR 77 | Temp 98.3°F | Ht 61.5 in | Wt 120.8 lb

## 2023-08-22 DIAGNOSIS — E1169 Type 2 diabetes mellitus with other specified complication: Secondary | ICD-10-CM | POA: Diagnosis not present

## 2023-08-22 DIAGNOSIS — J3489 Other specified disorders of nose and nasal sinuses: Secondary | ICD-10-CM | POA: Diagnosis not present

## 2023-08-22 DIAGNOSIS — R058 Other specified cough: Secondary | ICD-10-CM | POA: Diagnosis not present

## 2023-08-22 DIAGNOSIS — Z7984 Long term (current) use of oral hypoglycemic drugs: Secondary | ICD-10-CM

## 2023-08-22 DIAGNOSIS — E782 Mixed hyperlipidemia: Secondary | ICD-10-CM | POA: Diagnosis not present

## 2023-08-22 DIAGNOSIS — J392 Other diseases of pharynx: Secondary | ICD-10-CM | POA: Diagnosis not present

## 2023-08-22 LAB — POC COVID19/FLU A&B COMBO
Covid Antigen, POC: NEGATIVE
Influenza A Antigen, POC: NEGATIVE
Influenza B Antigen, POC: NEGATIVE

## 2023-08-22 MED ORDER — EZETIMIBE 10 MG PO TABS: 10.0000 mg | ORAL_TABLET | Freq: Every day | ORAL | 0 refills | Status: DC

## 2023-08-22 MED ORDER — GUAIFENESIN 200 MG PO TABS: 200.0000 mg | ORAL_TABLET | ORAL | 0 refills | Status: DC | PRN

## 2023-08-22 MED ORDER — CETIRIZINE HCL 10 MG PO TABS: 10.0000 mg | ORAL_TABLET | Freq: Every day | ORAL | 0 refills | Status: DC

## 2023-08-22 MED ORDER — BENZONATATE 200 MG PO CAPS: 200.0000 mg | ORAL_CAPSULE | Freq: Two times a day (BID) | ORAL | 0 refills | Status: DC | PRN

## 2023-08-22 NOTE — Patient Instructions (Addendum)
MEDICATION: For the next 3-4 days, consistently take Tylenol 500 mg every 4-6 hours to reduce inflammation and pain. Take cetirizine once daily to reduce the inflammation and irritation in your ears and throat. Use benzonatate 1-2 times daily for cough. One dose should be at bedtime.  OTHER SUPPORTIVE CARE: Make sure you are getting at least 64 oz of fluid daily. Drink warm tea with honey to sooth your throat. Honey also offers anticough properties. Avoid smoking when you are sick. Rest as much as you can.  REFILLS: Your ezetemibe (Zetia) was sent to the pharmacy again today.  You will need to contact your cardiologist to send a refill of your Plavix as they are managing that medication.

## 2023-08-22 NOTE — Progress Notes (Signed)
Acute Office Visit  Subjective:     Patient ID: Autumn Walker, female    DOB: 09-19-1950, 73 y.o.   MRN: [id number]  Chief Complaint  Patient presents with   Cough   Fever    HPI Patient is in today for URI.  She reports that 5 days ago she started coughing.  At first it was productive, it felt "like glass" in her throat.  She developed a fever of 102 F 2 days ago.  Today, she reports that she continues to have a nonproductive cough and that her ears are bothering her.  She also has been experiencing ankle and elbow pain during this time.  Denies nausea, vomiting, abdominal pain.  She does endorse 3 days of diarrhea.  ROS See HPI    Objective:    BP (!) 90/53   Pulse 77   Temp 98.3 F (36.8 C) (Oral)   Ht 5' 1.5" (1.562 m)   Wt 120 lb 12 oz (54.8 kg)   SpO2 94%   BMI 22.45 kg/m   Physical Exam Constitutional:      General: She is not in acute distress.    Appearance: Normal appearance. She is not ill-appearing.  HENT:     Head: Normocephalic and atraumatic.     Right Ear: Tympanic membrane, ear canal and external ear normal.     Left Ear: Tympanic membrane, ear canal and external ear normal.     Nose: Rhinorrhea present. No congestion.     Mouth/Throat:     Mouth: Mucous membranes are moist.     Pharynx: Oropharynx is clear. No oropharyngeal exudate or posterior oropharyngeal erythema.  Eyes:     General:        Right eye: No discharge.        Left eye: No discharge.     Extraocular Movements: Extraocular movements intact.     Conjunctiva/sclera: Conjunctivae normal.     Pupils: Pupils are equal, round, and reactive to light.  Cardiovascular:     Rate and Rhythm: Normal rate and regular rhythm.     Heart sounds: No murmur heard.    No friction rub. No gallop.  Pulmonary:     Effort: Pulmonary effort is normal. No respiratory distress.     Breath sounds: Normal breath sounds. No wheezing, rhonchi or rales.  Lymphadenopathy:     Cervical: Cervical  adenopathy present.  Skin:    General: Skin is warm and dry.  Neurological:     Mental Status: She is alert and oriented to person, place, and time.    Results for orders placed or performed in visit on 08/22/23  POC Covid19/Flu A&B Antigen  Result Value Ref Range   Influenza A Antigen, POC Negative Negative   Influenza B Antigen, POC Negative Negative   Covid Antigen, POC Negative Negative      Assessment & Plan:  Nonproductive cough -     Benzonatate; Take 1 capsule (200 mg total) by mouth 2 (two) times daily as needed for cough.  Dispense: 20 capsule; Refill: 0 -     POC Covid19/Flu A&B Antigen  Rhinorrhea -     guaiFENesin; Take 1 tablet (200 mg total) by mouth every 4 (four) hours as needed.  Dispense: 30 tablet; Refill: 0 -     POC Covid19/Flu A&B Antigen  Throat irritation -     Cetirizine HCl; Take 1 tablet (10 mg total) by mouth daily.  Dispense: 30 tablet; Refill: 0 -  POC Covid19/Flu A&B Antigen  Dyslipidemia with low high density lipoprotein (HDL) cholesterol with hypertriglyceridemia due to type 2 diabetes mellitus (HCC) -     Ezetimibe; Take 1 tablet (10 mg total) by mouth daily after supper.  Dispense: 90 tablet; Refill: 0  Point-of-care test negative for influenza and COVID.  Presentation, physical exam, and 5-day course consistent with viral URI.  Encouraged symptomatic management with Tylenol, increased hydration, warm tea with honey, benzonatate for cough, guaifenesin for rhinorrhea, and cetirizine for ear and throat irritation.  Patient will contact PCP if symptoms worsen or do not resolve by the 7-10-day mark.  Return if symptoms worsen or fail to improve.  [name], PA

## 2023-08-29 ENCOUNTER — Emergency Department (HOSPITAL_COMMUNITY)
Admission: EM | Admit: 2023-08-29 | Discharge: 2023-08-29 | Disposition: A | Discharge status: Home / Self Care | Payer: Medicare HMO | Attending: Emergency Medicine | Admitting: Emergency Medicine

## 2023-08-29 ENCOUNTER — Ambulatory Visit: Payer: Medicare HMO | Admitting: Family Medicine

## 2023-08-29 ENCOUNTER — Other Ambulatory Visit: Payer: Self-pay

## 2023-08-29 ENCOUNTER — Encounter (HOSPITAL_COMMUNITY): Payer: Self-pay | Admitting: Emergency Medicine

## 2023-08-29 DIAGNOSIS — Z7982 Long term (current) use of aspirin: Secondary | ICD-10-CM | POA: Insufficient documentation

## 2023-08-29 DIAGNOSIS — Z7984 Long term (current) use of oral hypoglycemic drugs: Secondary | ICD-10-CM | POA: Insufficient documentation

## 2023-08-29 DIAGNOSIS — R0689 Other abnormalities of breathing: Secondary | ICD-10-CM | POA: Diagnosis not present

## 2023-08-29 DIAGNOSIS — Z79899 Other long term (current) drug therapy: Secondary | ICD-10-CM | POA: Insufficient documentation

## 2023-08-29 DIAGNOSIS — R051 Acute cough: Secondary | ICD-10-CM | POA: Diagnosis not present

## 2023-08-29 DIAGNOSIS — R42 Dizziness and giddiness: Secondary | ICD-10-CM | POA: Diagnosis not present

## 2023-08-29 DIAGNOSIS — R531 Weakness: Secondary | ICD-10-CM | POA: Diagnosis not present

## 2023-08-29 DIAGNOSIS — R110 Nausea: Secondary | ICD-10-CM | POA: Insufficient documentation

## 2023-08-29 DIAGNOSIS — R9431 Abnormal electrocardiogram [ECG] [EKG]: Secondary | ICD-10-CM | POA: Diagnosis not present

## 2023-08-29 DIAGNOSIS — I959 Hypotension, unspecified: Secondary | ICD-10-CM | POA: Diagnosis not present

## 2023-08-29 LAB — CBG MONITORING, ED: Glucose-Capillary: 137 mg/dL — ABNORMAL HIGH (ref 70–99)

## 2023-08-29 LAB — BASIC METABOLIC PANEL
Anion gap: 13 (ref 5–15)
BUN: 10 mg/dL (ref 8–23)
CO2: 22 mmol/L (ref 22–32)
Calcium: 9.2 mg/dL (ref 8.9–10.3)
Chloride: 102 mmol/L (ref 98–111)
Creatinine, Ser: 0.81 mg/dL (ref 0.44–1.00)
GFR, Estimated: >60 mL/min (ref >60)
Glucose, Bld: 148 mg/dL — ABNORMAL HIGH (ref 70–99)
Potassium: 3.5 mmol/L (ref 3.5–5.1)
Sodium: 137 mmol/L (ref 135–145)

## 2023-08-29 LAB — CBC
HCT: 38.3 % (ref 36.0–46.0)
Hemoglobin: 12.6 g/dL (ref 12.0–15.0)
MCH: 32.3 pg (ref 26.0–34.0)
MCHC: 32.9 g/dL (ref 30.0–36.0)
MCV: 98.2 fL (ref 80.0–100.0)
Platelets: 396 10*3/uL (ref 150–400)
RBC: 3.9 MIL/uL (ref 3.87–5.11)
RDW: 12.8 % (ref 11.5–15.5)
WBC: 8.7 10*3/uL (ref 4.0–10.5)
nRBC: 0 % (ref 0.0–0.2)

## 2023-08-29 LAB — URINALYSIS, ROUTINE W REFLEX MICROSCOPIC
Bilirubin Urine: NEGATIVE
Glucose, UA: NEGATIVE mg/dL
Hgb urine dipstick: NEGATIVE
Ketones, ur: NEGATIVE mg/dL
Leukocytes,Ua: NEGATIVE
Nitrite: NEGATIVE
Protein, ur: NEGATIVE mg/dL
Specific Gravity, Urine: 1.004 — ABNORMAL LOW (ref 1.005–1.030)
pH: 7 (ref 5.0–8.0)

## 2023-08-29 MED ORDER — AMOXICILLIN 500 MG PO CAPS: 1000.0000 mg | ORAL_CAPSULE | Freq: Two times a day (BID) | ORAL | 0 refills | Status: DC

## 2023-08-29 MED ORDER — ONDANSETRON HCL 4 MG/2ML IJ SOLN
4.0000 mg | Freq: Once | INTRAMUSCULAR | Status: AC
  Administered 2023-08-29: 4 mg via INTRAVENOUS
  Filled 2023-08-29: qty 2

## 2023-08-29 MED ORDER — SODIUM CHLORIDE 0.9 % IV BOLUS
1000.0000 mL | Freq: Once | INTRAVENOUS | Status: AC
  Administered 2023-08-29: 1000 mL via INTRAVENOUS

## 2023-08-29 MED ORDER — GUAIFENESIN-CODEINE 100-10 MG/5ML PO SOLN: 10.0000 mL | Freq: Three times a day (TID) | ORAL | 0 refills | Status: DC | PRN

## 2023-08-29 MED ORDER — ONDANSETRON 4 MG PO TBDP: 4.0000 mg | ORAL_TABLET | Freq: Three times a day (TID) | ORAL | 0 refills | Status: AC | PRN

## 2023-08-29 NOTE — Discharge Instructions (Addendum)
Take Zofran for nausea every 8 hours as needed. Drink plenty of fluids, advance diet as tolerated. Take Amoxicillin twice daily until gone. Cough can be treated with use of your Albuterol inhaler. You can also use the cough syrup provided by prescription (guaifenesin-codeine).   Return to the ED or be seen by your doctor with any new or worsening symptoms.

## 2023-08-29 NOTE — ED Notes (Signed)
Patient was given crackers and a drink per PA [facility] request.

## 2023-08-29 NOTE — ED Triage Notes (Signed)
BIB EMS from home. Fatigue and weakness, decreased appetite x 2 weeks. Family concerned with dehydration. Alert and oriented x 4. Denies n/v/d.   VS  Hr 76 100/60 100% RA Cbg 217  20Lforearm 300LR given

## 2023-08-29 NOTE — ED Provider Notes (Signed)
[place] EMERGENCY DEPARTMENT AT [facility] Provider Note   CSN: [id number] Arrival date & time: 08/29/23  2040     History  Chief Complaint  Patient presents with   Weakness    Autumn Walker is a 73 y.o. female.  Patient to ED for evaluation of severe nausea for the past 2 weeks. She denies any vomiting. No fever, pain, syncope. She feels increasingly weak and lightheaded. No diarrhea. No urinary symptoms. She says she hasn't been able to eat anything as this makes the nausea worse.   The history is provided by the patient. No language interpreter was used.  Weakness      Home Medications Prior to Admission medications   Medication Sig Start Date End Date Taking? Authorizing Provider  amoxicillin (AMOXIL) 500 MG capsule Take 2 capsules (1,000 mg total) by mouth 2 (two) times daily. 08/29/23  Yes Kristianne Albin, Melvenia Beam, PA-C  guaiFENesin-codeine 100-10 MG/5ML syrup Take 10 mLs by mouth 3 (three) times daily as needed for cough. 08/29/23  Yes Al Decant, PA-C  ondansetron (ZOFRAN-ODT) 4 MG disintegrating tablet Take 1 tablet (4 mg total) by mouth every 8 (eight) hours as needed for nausea or vomiting. 08/29/23  Yes Rola Lennon, Melvenia Beam, PA-C  acetaminophen (TYLENOL) 500 MG tablet Take 1,000 mg by mouth every 6 (six) hours as needed (pain.).    [provider]  albuterol (VENTOLIN HFA) 108 (90 Base) MCG/ACT inhaler INHALE 1-2 PUFFS INTO THE LUNGS EVERY 4 (FOUR) HOURS AS NEEDED FOR WHEEZING OR SHORTNESS OF BREATH. 08/17/20   Mayer Masker, PA-C  aspirin 81 MG chewable tablet Chew 1 tablet (81 mg total) by mouth daily. Patient taking differently: Chew 81 mg by mouth at bedtime. 04/27/23   Hongalgi, Maximino Greenland, MD  benzonatate (TESSALON) 200 MG capsule Take 1 capsule (200 mg total) by mouth 2 (two) times daily as needed for cough. 08/22/23   [name], PA  calcium carbonate (TUMS EX) 750 MG chewable tablet Chew 2 tablets by mouth daily as needed for  heartburn (Indigestion).    [provider]  cetirizine (ZYRTEC) 10 MG tablet Take 1 tablet (10 mg total) by mouth daily. 08/22/23   [name], PA  clopidogrel (PLAVIX) 75 MG tablet Take 1 tablet (75 mg total) by mouth daily. Patient taking differently: Take 75 mg by mouth at bedtime. 04/27/23   Hongalgi, Maximino Greenland, MD  ezetimibe (ZETIA) 10 MG tablet Take 1 tablet (10 mg total) by mouth daily after supper. 08/22/23   [name] A, PA  fluticasone (FLONASE) 50 MCG/ACT nasal spray USE 1 SPRAY IN EACH NOSTRIL TWICE DAILY FOLLOWING SINUS RINSES Patient taking differently: as needed. USE 1 SPRAY IN EACH NOSTRIL TWICE DAILY FOLLOWING SINUS RINSES 02/05/19   [name], Gavin Pound, DO  gabapentin (NEURONTIN) 300 MG capsule Take 1 capsule (300 mg total) by mouth in the morning AND 1 capsule (300 mg total) daily in the afternoon AND 2 capsules (600 mg total) at bedtime. 05/15/23   [name] A, PA  guaiFENesin 200 MG tablet Take 1 tablet (200 mg total) by mouth every 4 (four) hours as needed. 08/22/23   [name], PA  metFORMIN (GLUCOPHAGE) 1000 MG tablet TAKE 1 TABLET (1,000 MG TOTAL) BY MOUTH TWICE A DAY WITH FOOD 07/11/23   [name] A, PA  OVER THE COUNTER MEDICATION Take 2-3 tablets by mouth every 4 (four) hours as needed (leg cramps). : Medication name-[facility] Homeopathic:  Place 2-3 tabs under tongue every 4  hours . If needed, place 2-3 additional tablets under the tongue every 15 mins for up to 6 doses.    [provider]  rosuvastatin (CRESTOR) 20 MG tablet Take 20 mg by mouth daily.    [provider]  Vitamin D, Ergocalciferol, (DRISDOL) 1.25 MG (50000 UNIT) CAPS capsule Take 50,000 Units by mouth every Sunday.    [provider]      Allergies    Statins and Codeine    Review of Systems   Review of Systems  Neurological:  Positive for weakness.    Physical Exam Updated Vital Signs BP 114/60 (BP Location: Left Arm)   Pulse 73   Temp  98.2 F (36.8 C)   Resp 18   Ht 5\' 1"  (1.549 m)   Wt 52.2 kg   SpO2 99%   BMI 21.73 kg/m  Physical Exam Vitals and nursing note reviewed.  Constitutional:      Appearance: She is well-developed.  HENT:     Head: Normocephalic.  Cardiovascular:     Rate and Rhythm: Normal rate and regular rhythm.     Heart sounds: No murmur heard. Pulmonary:     Effort: Pulmonary effort is normal.     Breath sounds: Normal breath sounds. No wheezing, rhonchi or rales.  Abdominal:     General: There is no distension.     Palpations: Abdomen is soft.     Tenderness: There is no abdominal tenderness. There is no guarding or rebound.  Musculoskeletal:        General: Normal range of motion.     Cervical back: Normal range of motion and neck supple.  Skin:    General: Skin is warm and dry.  Neurological:     General: No focal deficit present.     Mental Status: She is alert and oriented to person, place, and time.     ED Results / Procedures / Treatments   Labs (all labs ordered are listed, but only abnormal results are displayed) Labs Reviewed  BASIC METABOLIC PANEL - Abnormal; Notable for the following components:      Result Value   Glucose, Bld 148 (*)    All other components within normal limits  URINALYSIS, ROUTINE W REFLEX MICROSCOPIC - Abnormal; Notable for the following components:   Color, Urine STRAW (*)    Specific Gravity, Urine 1.004 (*)    All other components within normal limits  CBG MONITORING, ED - Abnormal; Notable for the following components:   Glucose-Capillary 137 (*)    All other components within normal limits  CBC    EKG None  Radiology No results found.  Procedures Procedures    Medications Ordered in ED Medications  sodium chloride 0.9 % bolus 1,000 mL (0 mLs Intravenous Stopped 08/29/23 2219)  ondansetron (ZOFRAN) injection 4 mg (4 mg Intravenous Given 08/29/23 2110)    ED Course/ Medical Decision Making/ A&P Clinical Course as of 08/29/23  2305  Tue Aug 29, 2023  2108 Patient from home with nausea x 2 weeks without vomiting. No pain. Now weak and dizzy. VSS. Labs pending, IV fluids ordered.  [SU]  2254 After IV fluids, the patient feels much better. Zofran provided with resolution of nausea. She is tolerating liquids and solids without recurrent nausea.  Labs reassuring with no leukocytosis, electrolyte derangement.   She adds that she has had a cough for 2 weeks as well. H/O COPD, continuous smoker. No fever. No SOB. Will cover with abx given duration of  symptoms. Encouraged use of her Albuterol inhaler (which she does not typically need to use) for cough. Will given Rx Zofran, cough medication. Discussed return precautions.  [SU]    Clinical Course User Index [SU] Elpidio Anis, PA-C                                 Medical Decision Making Amount and/or Complexity of Data Reviewed Labs: ordered.  Risk Prescription drug management.           Final Clinical Impression(s) / ED Diagnoses Final diagnoses:  Nausea  Acute cough    Rx / DC Orders ED Discharge Orders          Ordered    ondansetron (ZOFRAN-ODT) 4 MG disintegrating tablet  Every 8 hours PRN        08/29/23 2259    amoxicillin (AMOXIL) 500 MG capsule  2 times daily        08/29/23 2259    guaiFENesin-codeine 100-10 MG/5ML syrup  3 times daily PRN        08/29/23 2302              Elpidio Anis, PA-C 08/29/23 2306    Lonell Grandchild, MD 08/30/23 [phone]

## 2023-09-09 ENCOUNTER — Other Ambulatory Visit: Payer: Self-pay | Admitting: Family Medicine

## 2023-09-09 DIAGNOSIS — E1169 Type 2 diabetes mellitus with other specified complication: Secondary | ICD-10-CM

## 2023-09-14 ENCOUNTER — Other Ambulatory Visit: Payer: Self-pay | Admitting: Family Medicine

## 2023-09-14 DIAGNOSIS — J392 Other diseases of pharynx: Secondary | ICD-10-CM

## 2023-10-25 ENCOUNTER — Telehealth: Payer: Self-pay | Admitting: *Deleted

## 2023-10-25 ENCOUNTER — Other Ambulatory Visit: Payer: Self-pay | Admitting: Family Medicine

## 2023-10-25 DIAGNOSIS — N1832 Chronic kidney disease, stage 3b: Secondary | ICD-10-CM

## 2023-10-25 DIAGNOSIS — E782 Mixed hyperlipidemia: Secondary | ICD-10-CM

## 2023-10-25 DIAGNOSIS — E1100 Type 2 diabetes mellitus with hyperosmolarity without nonketotic hyperglycemic-hyperosmolar coma (NKHHC): Secondary | ICD-10-CM

## 2023-10-25 NOTE — Telephone Encounter (Signed)
I put the lab orders in

## 2023-10-25 NOTE — Telephone Encounter (Signed)
 Pt scheduled in June for the appt.

## 2023-10-25 NOTE — Telephone Encounter (Signed)
 Copied from CRM [id number]. Topic: Clinical - Request for Lab/Test Order >> Oct 25, 2023  1:37 PM Autumn Walker wrote: Reason for CRM: patient calling in requesting labs for full blood panel.  Transfer of from Maryclare Smoke PA to Dr Arabella Beach

## 2023-10-25 NOTE — Telephone Encounter (Signed)
 Copied from CRM [id number]. Topic: Appointments - Appointment Scheduling >> Oct 25, 2023  1:41 PM Rosamond Comes wrote: Patient/patient representative is calling to schedule an appointment. Refer to attachments for appointment information.  Pt is requesting to transfer FROM: Maryclare Smoke PA Pt is requesting to transfer TO: Dr Ambrosio Junker Reason for requested transfer: Maryclare Smoke no longer with clinic It is the responsibility of the team the patient would like to transfer to (Dr. Arabella Beach) to reach out to the patient if for any reason this transfer is not acceptable.

## 2023-10-26 NOTE — Telephone Encounter (Signed)
 Lab appt scheduled.

## 2023-11-06 ENCOUNTER — Ambulatory Visit: Payer: Self-pay | Admitting: *Deleted

## 2023-11-06 NOTE — Telephone Encounter (Signed)
 Summary: Dizziness   Copied From CRM [id number]. Reason for Triage: Dizziness and problems with her eyes  Best contact: [phone]         Reason for Disposition  [1] MODERATE dizziness (e.g., interferes with normal activities) AND [2] has NOT been evaluated by doctor (or NP/PA) for this  (Exception: Dizziness caused by heat exposure, sudden standing, or poor fluid intake.)  Answer Assessment - Initial Assessment Questions 1. DESCRIPTION: "Describe your dizziness."     Stands up and feels things blank out  and then goes away- patient has checked BP- normal 2. LIGHTHEADED: "Do you feel lightheaded?" (e.g., somewhat faint, woozy, weak upon standing)     Eyes- L eye has white spot- clears, also having focusing issues  4. SEVERITY: "How bad is it?"  "Do you feel like you are going to faint?" "Can you stand and walk?"   - MILD: Feels slightly dizzy, but walking normally.   - MODERATE: Feels unsteady when walking, but not falling; interferes with normal activities (e.g., school, work).   - SEVERE: Unable to walk without falling, or requires assistance to walk without falling; feels like passing out now.      Mild/moderate- only occurs when going from sitting to standing 5. ONSET:  "When did the dizziness begin?"     2 days 6. AGGRAVATING FACTORS: "Does anything make it worse?" (e.g., standing, change in head position)     Sitting to standing only, does have some congestion 7. HEART RATE: "Can you tell me your heart rate?" "How many beats in 15 seconds?"  (Note: not all patients can do this)       normal 8. CAUSE: "What do you think is causing the dizziness?"     Unsure- thought BP- 124/62 P 96 9. RECURRENT SYMPTOM: "Have you had dizziness before?" If Yes, ask: "When was the last time?" "What happened that time?"     Yes- sinus issues 10. OTHER SYMPTOMS: "Do you have any other symptoms?" (e.g., fever, chest pain, vomiting, diarrhea, bleeding)       no  Protocols used: Dizziness -  Lightheadedness-A-AH

## 2023-11-06 NOTE — Telephone Encounter (Signed)
 Contacted pt and scheduled her an appointment for tomorrow.

## 2023-11-06 NOTE — Telephone Encounter (Signed)
   Chief Complaint: dizziness- only when going from sitting to standing Symptoms: new onset dizziness, vision- focusing problems- when on computer- patient states she had last vision check 7/24.normal BP/P- see triage note Frequency: 2 days Pertinent Negatives: Patient denies fever, chest pain, vomiting, diarrhea, bleeding  Disposition: [] ED /[] Urgent Care (no appt availability in office) / [] Appointment(In office/virtual)/ []  [facility]/ [] Home Care/ [] Refused Recommended Disposition /[] [facility]/ [x]  Follow-up with PCP Additional Notes: Patient disposition is in 24 hours- no open appointment . Patient states she would like to come to office tomorrow if she can be seen. Patient advised I will send message for scheduling- she may be advised UC- but will see if she can be seen. Patient states she has been out of her blood thinner for some time- she was released from neurology and they were filling it for her-see below .     Patient also states she has been without clopidogrel  (PLAVIX ) 75 MG tablet for several weeks- the neurologist had prescribed it and she was released from them. Patient has been taking 4 baby aspirin  daily while she has been out.

## 2023-11-07 ENCOUNTER — Encounter: Payer: Self-pay | Admitting: Family Medicine

## 2023-11-07 ENCOUNTER — Ambulatory Visit (INDEPENDENT_AMBULATORY_CARE_PROVIDER_SITE_OTHER): Admitting: Family Medicine

## 2023-11-07 VITALS — BP 108/71 | HR 103 | Ht 61.0 in | Wt 116.1 lb

## 2023-11-07 DIAGNOSIS — J3489 Other specified disorders of nose and nasal sinuses: Secondary | ICD-10-CM

## 2023-11-07 DIAGNOSIS — R42 Dizziness and giddiness: Secondary | ICD-10-CM | POA: Diagnosis not present

## 2023-11-07 DIAGNOSIS — I350 Nonrheumatic aortic (valve) stenosis: Secondary | ICD-10-CM

## 2023-11-07 DIAGNOSIS — R053 Chronic cough: Secondary | ICD-10-CM | POA: Diagnosis not present

## 2023-11-07 MED ORDER — CLOPIDOGREL BISULFATE 75 MG PO TABS: 75.0000 mg | ORAL_TABLET | Freq: Every evening | ORAL | 0 refills | Status: AC

## 2023-11-07 NOTE — Progress Notes (Signed)
   Established Patient Office Visit  Subjective   Patient ID: Autumn Walker, female    DOB: Dec 16, 1950  Age: 73 y.o. MRN: [id number]  Chief Complaint  Patient presents with   Dizziness    HPI  Subjective: - Presents with persistent cough for 2 months. Reports cough is productive. States this cough is different from allergy cough. Reports wheezing. - Reports dizziness when standing up and during coughing episodes. Describes feeling "everything just goes way in my head for a second" and feeling unsteady briefly. Occurs couple times a day, lasts few seconds. No syncope or falls. - Reports history of low blood pressure that drops occasionally. 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 - Reports occasional blurry vision, describes eyes not focusing together. Has cataracts, right eye needs treatment soon. - Reports head congestion. - PMHx: Stroke (04/2022), COPD, diabetes, high cholesterol, shingles with residual neuropathy, cataracts - Medications: Allegra OTC, Flonase  nasal spray, montelukast , baby aspirin  (taking 4/day since running out of clopidogrel ), Zetia  (cholesterol), metformin , rosuvastatin  - Previously on: Clopidogrel  75mg  daily (ran out, was supposed to continue until 12/13/2023), gabapentin  (not taking regularly now), losartan  (discontinued due to high potassium) - Social history: Not provided - Allergies: Reports "lots" of allergies  The ASCVD Risk score (Arnett DK, et al., 2019) failed to calculate for the following reasons:   Risk score cannot be calculated because patient has a medical history suggesting prior/existing ASCVD  Health Maintenance Due  Topic Date Due   Zoster Vaccines- Shingrix (1 of 2) Never done   MAMMOGRAM  03/05/2023   COVID-19 Vaccine (3 - 2024-25 season) 03/12/2023   Diabetic kidney evaluation - Urine ACR  09/13/2023   HEMOGLOBIN A1C  10/24/2023      Objective:     BP 108/71   Pulse (!) 103   Ht 5\' 1"  (1.549 m)   Wt 116 lb 1.9 oz (52.7 kg)   SpO2  97%   BMI 21.94 kg/m    Physical Exam Gen: alert, oriented Cv: rrr Pulm: mild, low pitch wheezing    No results found for any visits on 11/07/23.      Assessment & Plan:   Chronic cough Assessment & Plan: Getting cxr. Productive cough for over 2 months. Sending in cxr.  If no evidence of pna will treat as chronic bronchitis/copd  Orders: -     DG Chest 2 View; Future  Rhinorrhea  Orthostatic dizziness Assessment & Plan: Encouraged use of compression stockings    Severe aortic stenosis Assessment & Plan: S/p tavr. Pt recommended to be on dapt until June 2025.  Will send in additoinal 2 months of clopidogrel .     Other orders -     Clopidogrel  Bisulfate; Take 1 tablet (75 mg total) by mouth at bedtime.  Dispense: 60 tablet; Refill: 0     No follow-ups on file.    Laneta Pintos, MD

## 2023-11-07 NOTE — Patient Instructions (Addendum)
 It was nice to see you today,  We addressed the following topics today: -I am ordering a chest x-ray.  This will help me decide if we need to treat you for pneumonia or COPD exacerbation. - You can have the x-ray done at your earliest convenience at [facility] on [facility] - I am sending in a 71-month refill for your Plavix . - We can follow-up with your chronic issues in June - For orthostatic symptoms such as dizziness when standing up, you can wear compression socks that you get over-the-counter at the pharmacy.   Have a great day,  Etha Henle, MD

## 2023-11-08 ENCOUNTER — Ambulatory Visit
Admission: RE | Admit: 2023-11-08 | Discharge: 2023-11-08 | Disposition: A | Discharge status: Home / Self Care | Source: Ambulatory Visit | Attending: Family Medicine

## 2023-11-08 DIAGNOSIS — R053 Chronic cough: Secondary | ICD-10-CM

## 2023-11-12 DIAGNOSIS — R42 Dizziness and giddiness: Secondary | ICD-10-CM | POA: Insufficient documentation

## 2023-11-12 DIAGNOSIS — R053 Chronic cough: Secondary | ICD-10-CM | POA: Insufficient documentation

## 2023-11-12 NOTE — Assessment & Plan Note (Signed)
 Getting cxr. Productive cough for over 2 months. Sending in cxr.  If no evidence of pna will treat as chronic bronchitis/copd

## 2023-11-12 NOTE — Assessment & Plan Note (Signed)
 S/p tavr. Pt recommended to be on dapt until June 2025.  Will send in additoinal 2 months of clopidogrel .

## 2023-11-12 NOTE — Assessment & Plan Note (Signed)
Encouraged use of compression stockings. ?

## 2023-11-13 ENCOUNTER — Telehealth: Payer: Self-pay

## 2023-11-13 ENCOUNTER — Other Ambulatory Visit: Payer: Self-pay | Admitting: Family Medicine

## 2023-11-13 MED ORDER — AMOXICILLIN-POT CLAVULANATE 875-125 MG PO TABS
1.0000 | ORAL_TABLET | Freq: Two times a day (BID) | ORAL | 0 refills | Status: AC
Start: 2023-11-13 — End: 2023-11-18

## 2023-11-13 MED ORDER — AZITHROMYCIN 250 MG PO TABS: ORAL_TABLET | ORAL | 0 refills | Status: AC

## 2023-11-13 NOTE — Telephone Encounter (Signed)
 Patient was already given this information in Result message.
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
## 2023-11-13 NOTE — Telephone Encounter (Signed)
 Copied from CRM [id number]. Topic: Clinical - Lab/Test Results >> Nov 10, 2023  1:31 PM Lizabeth Riggs wrote: Reason for CRM:  Please call Debbie at [phone] with the imaging results from her Chest x-ray. I did not see any notes from Dr. Arabella Beach.

## 2023-11-15 ENCOUNTER — Encounter: Payer: Self-pay | Admitting: Cardiovascular Disease

## 2023-11-15 ENCOUNTER — Ambulatory Visit: Payer: Medicare HMO | Attending: Cardiovascular Disease | Admitting: Cardiovascular Disease

## 2023-11-15 VITALS — BP 114/62 | HR 107 | Ht 61.5 in | Wt 116.6 lb

## 2023-11-15 DIAGNOSIS — J449 Chronic obstructive pulmonary disease, unspecified: Secondary | ICD-10-CM

## 2023-11-15 DIAGNOSIS — F17200 Nicotine dependence, unspecified, uncomplicated: Secondary | ICD-10-CM

## 2023-11-15 DIAGNOSIS — Z952 Presence of prosthetic heart valve: Secondary | ICD-10-CM | POA: Diagnosis not present

## 2023-11-15 NOTE — Patient Instructions (Addendum)
 Medication Instructions:  Your physician recommends that you continue on your current medications as directed. Please refer to the Current Medication list given to you today.  *If you need a refill on your cardiac medications before your next appointment, please call your pharmacy*  Lab Work: If you have labs (blood work) drawn today and your tests are completely normal, you will receive your results only by: MyChart Message (if you have MyChart) OR A paper copy in the mail If you have any lab test that is abnormal or we need to change your treatment, we will call you to review the results.  Testing/Procedures: Your physician has requested that you have an echocardiogram in December same day as office visit. Echocardiography is a painless test that uses sound waves to create images of your heart. It provides your doctor with information about the size and shape of your heart and how well your heart's chambers and valves are working. This procedure takes approximately one hour. There are no restrictions for this procedure. Please do NOT wear cologne, perfume, aftershave, or lotions (deodorant is allowed). Please arrive 15 minutes prior to your appointment time.  Please note: We ask at that you not bring children with you during ultrasound (echo/ vascular) testing. Due to room size and safety concerns, children are not allowed in the ultrasound rooms during exams. Our front office staff cannot provide observation of children in our lobby area while testing is being conducted. An adult accompanying a patient to their appointment will only be allowed in the ultrasound room at the discretion of the ultrasound technician under special circumstances. We apologize for any inconvenience.  CT scanning for cancer screening, (CAT scanning), is a noninvasive, special x-ray that produces cross-sectional images of the body using x-rays and a computer. CT scans help physicians diagnose and treat medical  conditions. For some CT exams, a contrast material is used to enhance visibility in the area of the body being studied. CT scans provide greater clarity and reveal more details than regular x-ray exams.  Follow-Up: At [facility], you and your health needs are our priority.  As part of our continuing mission to provide you with exceptional heart care, our providers are all part of one team.  This team includes your primary Cardiologist (physician) and Advanced Practice Providers or APPs (Physician Assistants and Nurse Practitioners) who all work together to provide you with the care you need, when you need it.  Your next appointment:   December same day as office visit  Provider:   Janelle Mediate, MD    We recommend signing up for the patient portal called "MyChart".  Sign up information is provided on this After Visit Summary.  MyChart is used to connect with patients for Virtual Visits (Telemedicine).  Patients are able to view lab/test results, encounter notes, upcoming appointments, etc.  Non-urgent messages can be sent to your provider as well.   To learn more about what you can do with MyChart, go to [email or web address].

## 2023-11-20 ENCOUNTER — Telehealth: Payer: Self-pay | Admitting: *Deleted

## 2023-11-20 MED ORDER — FLUCONAZOLE 150 MG PO TABS: ORAL_TABLET | ORAL | 0 refills | Status: DC

## 2023-11-20 NOTE — Telephone Encounter (Signed)
 I have sent it in.

## 2023-11-20 NOTE — Telephone Encounter (Signed)
 Copied from CRM [id number]. Topic: Clinical - Medical Advice >> Nov 20, 2023  3:08 PM Star East wrote: Reason for CRM: Finished antibiotic and now have yeast infection- asking for prescription to clear it up- [phone]

## 2023-11-20 NOTE — Telephone Encounter (Signed)
 Pt informed of below.

## 2023-11-21 ENCOUNTER — Other Ambulatory Visit: Payer: Self-pay | Admitting: Family Medicine

## 2023-12-21 ENCOUNTER — Other Ambulatory Visit

## 2023-12-21 DIAGNOSIS — E782 Mixed hyperlipidemia: Secondary | ICD-10-CM

## 2023-12-21 DIAGNOSIS — N1832 Chronic kidney disease, stage 3b: Secondary | ICD-10-CM

## 2023-12-21 DIAGNOSIS — E1100 Type 2 diabetes mellitus with hyperosmolarity without nonketotic hyperglycemic-hyperosmolar coma (NKHHC): Secondary | ICD-10-CM

## 2023-12-21 DIAGNOSIS — E1169 Type 2 diabetes mellitus with other specified complication: Secondary | ICD-10-CM

## 2023-12-22 ENCOUNTER — Ambulatory Visit: Payer: Self-pay

## 2023-12-26 ENCOUNTER — Ambulatory Visit (INDEPENDENT_AMBULATORY_CARE_PROVIDER_SITE_OTHER): Admitting: Family Medicine

## 2023-12-26 ENCOUNTER — Encounter: Payer: Self-pay | Admitting: Family Medicine

## 2023-12-26 VITALS — BP 98/61 | HR 86 | Ht 61.5 in | Wt 117.4 lb

## 2023-12-26 DIAGNOSIS — J3489 Other specified disorders of nose and nasal sinuses: Secondary | ICD-10-CM | POA: Diagnosis not present

## 2023-12-26 DIAGNOSIS — E875 Hyperkalemia: Secondary | ICD-10-CM | POA: Diagnosis not present

## 2023-12-26 DIAGNOSIS — K760 Fatty (change of) liver, not elsewhere classified: Secondary | ICD-10-CM | POA: Insufficient documentation

## 2023-12-26 DIAGNOSIS — R42 Dizziness and giddiness: Secondary | ICD-10-CM

## 2023-12-26 DIAGNOSIS — N1832 Chronic kidney disease, stage 3b: Secondary | ICD-10-CM

## 2023-12-26 DIAGNOSIS — R053 Chronic cough: Secondary | ICD-10-CM

## 2023-12-26 MED ORDER — GUAIFENESIN 200 MG PO TABS: 200.0000 mg | ORAL_TABLET | ORAL | 0 refills | Status: DC | PRN

## 2023-12-26 NOTE — Assessment & Plan Note (Signed)
 Recheck kidney function today. Recent cr slightly elevated

## 2023-12-26 NOTE — Assessment & Plan Note (Signed)
 Pt states sx improved since last visit.  No concerns today regarding this

## 2023-12-26 NOTE — Assessment & Plan Note (Signed)
 Previously seen incidentally on CT with mentions of concern for cirrhosis.  Pt previously overweight for much of adult life. Denies alcohol use.  Agreeable to further imaging.  Will get liver elastography.

## 2023-12-26 NOTE — Progress Notes (Signed)
   Established Patient Office Visit  Subjective   Patient ID: Autumn Walker, female    DOB: [birth year]  Age: 73 y.o. MRN: [id number]  Chief Complaint  Patient presents with   Medical Management of Chronic Issues    HPI  Subjective - Cough: much improved - Dizziness: resolved - Medication compliance: taking aspirin  and Plavix  until 01/11/2024 or 01/12/2024 - Liver concerns: reports history of fatty liver, denies alcohol use, reports taking Tylenol /Advil frequently for years before dental extraction - Weight history: reports significant obesity in youth, moderate obesity as adult, currently not overweight - Denies high cholesterol currently  Medications: aspirin  and Plavix  until early July 2025  PMH: fatty liver, cirrhosis (incidental finding on imaging), moderate chronic kidney disease (GFR 54), history of obesity  ROS: denies dizziness, cough improved   The ASCVD Risk score (Arnett DK, et al., 2019) failed to calculate for the following reasons:   Risk score cannot be calculated because patient has a medical history suggesting prior/existing ASCVD  Health Maintenance Due  Topic Date Due   Zoster Vaccines- Shingrix (1 of 2) Never done   MAMMOGRAM  03/05/2023   COVID-19 Vaccine (3 - 2024-25 season) 03/12/2023   Diabetic kidney evaluation - Urine ACR  09/13/2023   Medicare Annual Wellness (AWV)  01/17/2024      Objective:     BP 98/61   Pulse 86   Ht 5' 1.5 (1.562 m)   Wt 117 lb 6.4 oz (53.3 kg)   SpO2 98%   BMI 21.82 kg/m    Physical Exam Gen: alert, oriented Pulm: no resp distress Psych: pleasant affect   No results found for any visits on 12/26/23.      Assessment & Plan:   Hepatic steatosis Assessment & Plan: Previously seen incidentally on CT with mentions of concern for cirrhosis.  Pt previously overweight for much of adult life. Denies alcohol use.  Agreeable to further imaging.  Will get liver elastography.    Orders: -     US   ELASTOGRAPHY LIVER; Future -     Protime-INR  Rhinorrhea -     guaiFENesin ; Take 1 tablet (200 mg total) by mouth every 4 (four) hours as needed.  Dispense: 30 tablet; Refill: 0  Hyperkalemia -     Comprehensive metabolic panel with GFR  Orthostatic dizziness Assessment & Plan: Improved. No issues since last visit   Stage 3b chronic kidney disease (HCC) Assessment & Plan: Recheck kidney function today. Recent cr slightly elevated   Chronic cough Assessment & Plan: Pt states sx improved since last visit.  No concerns today regarding this      Return in about 4 months (around 04/26/2024) for DM.    Laneta Pintos, MD

## 2023-12-26 NOTE — Assessment & Plan Note (Signed)
 Improved. No issues since last visit

## 2023-12-26 NOTE — Patient Instructions (Signed)
 It was nice to see you today,  We addressed the following topics today: -I am ordering a liver ultrasound to assess your liver for possible cirrhosis. - Someone should call you to schedule this. - I will you know the results of your lab test when I get them.  Have a great day,  Etha Henle, MD

## 2023-12-27 LAB — PROTIME-INR
INR: 0.9 (ref 0.9–1.2)
Prothrombin Time: 10.6 s (ref 9.1–12.0)

## 2024-01-11 ENCOUNTER — Ambulatory Visit: Payer: Self-pay | Admitting: Family Medicine

## 2024-01-11 ENCOUNTER — Ambulatory Visit (HOSPITAL_BASED_OUTPATIENT_CLINIC_OR_DEPARTMENT_OTHER)
Admission: RE | Admit: 2024-01-11 | Discharge: 2024-01-11 | Disposition: A | Discharge status: Home / Self Care | Source: Ambulatory Visit | Attending: Family Medicine | Admitting: Family Medicine

## 2024-01-11 DIAGNOSIS — K709 Alcoholic liver disease, unspecified: Secondary | ICD-10-CM | POA: Diagnosis not present

## 2024-01-11 DIAGNOSIS — K769 Liver disease, unspecified: Secondary | ICD-10-CM | POA: Diagnosis not present

## 2024-01-11 DIAGNOSIS — K759 Inflammatory liver disease, unspecified: Secondary | ICD-10-CM | POA: Diagnosis not present

## 2024-01-11 DIAGNOSIS — K760 Fatty (change of) liver, not elsewhere classified: Secondary | ICD-10-CM | POA: Diagnosis not present

## 2024-01-11 DIAGNOSIS — K7689 Other specified diseases of liver: Secondary | ICD-10-CM | POA: Diagnosis not present

## 2024-01-25 ENCOUNTER — Ambulatory Visit: Payer: Medicare HMO

## 2024-01-25 DIAGNOSIS — Z0000 Encounter for general adult medical examination without abnormal findings: Secondary | ICD-10-CM

## 2024-01-25 DIAGNOSIS — Z1231 Encounter for screening mammogram for malignant neoplasm of breast: Secondary | ICD-10-CM

## 2024-01-25 NOTE — Patient Instructions (Signed)
 Autumn Walker , Thank you for taking time out of your busy schedule to complete your Annual Wellness Visit with me. I enjoyed our conversation and look forward to speaking with you again next year. I, as well as your care team,  appreciate your ongoing commitment to your health goals. Please review the following plan we discussed and let me know if I can assist you in the future. Your Game plan/ To Do List    Referrals: If you haven't heard from the office you've been referred to, please reach out to them at the phone provided.  N/a Follow up Visits: Next Medicare AWV with our clinical staff: 02/27/2025 at 9:30   Have you seen your provider in the last 6 months (3 months if uncontrolled diabetes)? Yes Next Office Visit with your provider: 04/26/2024 at 8:30  Clinician Recommendations:  Aim for 30 minutes of exercise or brisk walking, 6-8 glasses of water, and 5 servings of fruits and vegetables each day.       This is a list of the screening recommended for you and due dates:  Health Maintenance  Topic Date Due   Zoster (Shingles) Vaccine (1 of 2) Never done   Hepatitis B Vaccine (1 of 3 - Risk 3-dose series) Never done   Mammogram  03/05/2023   COVID-19 Vaccine (3 - 2024-25 season) 03/12/2023   Yearly kidney health urinalysis for diabetes  09/13/2023   Complete foot exam   01/13/2024   Flu Shot  02/09/2024   Eye exam for diabetics  02/14/2024   Screening for Lung Cancer  06/19/2024   Hemoglobin A1C  06/21/2024   Yearly kidney function blood test for diabetes  12/20/2024   Medicare Annual Wellness Visit  01/24/2025   Cologuard (Stool DNA test)  02/19/2026   DTaP/Tdap/Td vaccine (2 - Td or Tdap) 09/21/2027   Pneumococcal Vaccine for age over 85  Completed   DEXA scan (bone density measurement)  Completed   Hepatitis C Screening  Completed   HPV Vaccine  Aged Out   Meningitis B Vaccine  Aged Out   Colon Cancer Screening  Discontinued    Advanced directives: (ACP Link)Information on  Advanced Care Planning can be found at [place]  Secretary of [facility] Advance Health Care Directives Advance Health Care Directives. [email or web address]  Advance Care Planning is important because it:  [x]  Makes sure you receive the medical care that is consistent with your values, goals, and preferences  [x]  It provides guidance to your family and loved ones and reduces their decisional burden about whether or not they are making the right decisions based on your wishes.  Follow the link provided in your after visit summary or read over the paperwork we have mailed to you to help you started getting your Advance Directives in place. If you need assistance in completing these, please reach out to us  so that we can help you!  See attachments for Preventive Care and Fall Prevention Tips.

## 2024-01-25 NOTE — Progress Notes (Signed)
 Subjective:   Autumn Walker is a 73 y.o. who presents for a Medicare Wellness preventive visit.  As a reminder, Annual Wellness Visits don't include a physical exam, and some assessments may be limited, especially if this visit is performed virtually. We may recommend an in-person follow-up visit with your provider if needed.  Visit Complete: Virtual I connected with  Autumn Walker on 01/25/24 by a audio enabled telemedicine application and verified that I am speaking with the correct person using two identifiers.  Patient Location: Home  Provider Location: Home Office  I discussed the limitations of evaluation and management by telemedicine. The patient expressed understanding and agreed to proceed.  Vital Signs: Because this visit was a virtual/telehealth visit, some criteria may be missing or patient reported. Any vitals not documented were not able to be obtained and vitals that have been documented are patient reported.  VideoError- Librarian, academic were attempted between this provider and patient, however failed, due to patient having technical difficulties OR patient did not have access to video capability.  We continued and completed visit with audio only.   Persons Participating in Visit: Patient.  AWV Questionnaire: No: Patient Medicare AWV questionnaire was not completed prior to this visit.  Cardiac Risk Factors include: advanced age (>82men, >40 women);diabetes mellitus;dyslipidemia     Objective:    Today's Vitals   There is no height or weight on file to calculate BMI.     01/25/2024    8:58 AM 08/29/2023    8:46 PM 06/20/2023   10:50 AM 06/13/2023    8:07 AM 05/24/2023    6:54 AM 04/25/2023   10:27 AM 04/24/2023    7:33 PM  Advanced Directives  Does Patient Have a Medical Advance Directive? No No No No No No No  Would patient like information on creating a medical advance directive? No - Patient declined No  - Guardian declined  No - Patient declined Yes (MAU/Ambulatory/Procedural Areas - Information given) No - Patient declined     Current Medications (verified) Outpatient Encounter Medications as of 01/25/2024  Medication Sig   acetaminophen  (TYLENOL ) 500 MG tablet Take 1,000 mg by mouth every 6 (six) hours as needed (pain.).   albuterol  (VENTOLIN  HFA) 108 (90 Base) MCG/ACT inhaler INHALE 1-2 PUFFS INTO THE LUNGS EVERY 4 (FOUR) HOURS AS NEEDED FOR WHEEZING OR SHORTNESS OF BREATH.   aspirin  81 MG chewable tablet Chew 1 tablet (81 mg total) by mouth daily.   benzonatate  (TESSALON ) 200 MG capsule Take 1 capsule (200 mg total) by mouth 2 (two) times daily as needed for cough.   calcium  carbonate (TUMS EX) 750 MG chewable tablet Chew 2 tablets by mouth daily as needed for heartburn (Indigestion).   cetirizine  (ZYRTEC ) 10 MG tablet TAKE 1 TABLET BY MOUTH EVERY DAY   fluticasone  (FLONASE ) 50 MCG/ACT nasal spray USE 1 SPRAY IN EACH NOSTRIL TWICE DAILY FOLLOWING SINUS RINSES (Patient taking differently: as needed. USE 1 SPRAY IN EACH NOSTRIL TWICE DAILY FOLLOWING SINUS RINSES)   gabapentin  (NEURONTIN ) 300 MG capsule Take 1 capsule (300 mg total) by mouth in the morning AND 1 capsule (300 mg total) daily in the afternoon AND 2 capsules (600 mg total) at bedtime.   metFORMIN  (GLUCOPHAGE ) 1000 MG tablet TAKE 1 TABLET (1,000 MG TOTAL) BY MOUTH TWICE A DAY WITH FOOD   ondansetron  (ZOFRAN -ODT) 4 MG disintegrating tablet Take 1 tablet (4 mg total) by mouth every 8 (eight) hours as needed for nausea or vomiting.  OVER THE COUNTER MEDICATION Take 2-3 tablets by mouth every 4 (four) hours as needed (leg cramps). : Medication name-[employer]'s Homeopathic:  Place 2-3 tabs under tongue every 4 hours . If needed, place 2-3 additional tablets under the tongue every 15 mins for up to 6 doses.   rosuvastatin  (CRESTOR ) 20 MG tablet TAKE 1 TABLET BY MOUTH EVERY DAY   Vitamin D , Ergocalciferol , (DRISDOL ) 1.25 MG (50000 UNIT) CAPS  capsule Take 50,000 Units by mouth every Sunday.   ezetimibe  (ZETIA ) 10 MG tablet Take 1 tablet (10 mg total) by mouth daily after supper. (Patient not taking: Reported on 01/25/2024)   fluconazole  (DIFLUCAN ) 150 MG tablet Take one tablet orally for yeast infection. Take second tablet 3 days later if not improved. (Patient not taking: Reported on 01/25/2024)   guaiFENesin  200 MG tablet Take 1 tablet (200 mg total) by mouth every 4 (four) hours as needed. (Patient not taking: Reported on 01/25/2024)   guaiFENesin -codeine  100-10 MG/5ML syrup Take 10 mLs by mouth 3 (three) times daily as needed for cough. (Patient not taking: Reported on 01/25/2024)   No facility-administered encounter medications on file as of 01/25/2024.    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 Wendel and Weldner   Severe aortic stenosis    Stroke [facility])    Past Surgical History:  Procedure Laterality Date   APPENDECTOMY     CHOLECYSTECTOMY     INTRAOPERATIVE TRANSTHORACIC ECHOCARDIOGRAM N/A 06/13/2023   Procedure: INTRAOPERATIVE TRANSTHORACIC ECHOCARDIOGRAM;  Surgeon: Wendel Lurena POUR, MD;  Location: [facility] INVASIVE CV LAB;  Service: Cardiovascular;  Laterality: N/A;   LEFT HEART CATH AND CORONARY ANGIOGRAPHY N/A 05/24/2023   Procedure: LEFT HEART CATH AND CORONARY ANGIOGRAPHY;  Surgeon: Wonda Sharper, MD;  Location: [facility] INVASIVE CV LAB;  Service: Cardiovascular;  Laterality: N/A;   PANCREATECTOMY     SPLENECTOMY, TOTAL     Family History  Problem Relation Age of Onset   Thyroid  disease Mother    Varicose Veins Mother    Heart disease Father    Alcohol abuse Father    Heart attack Father    Hyperlipidemia Father    COPD  Father    Depression Sister    Thyroid  disease Sister    Anxiety disorder Sister    Miscarriages / Stillbirths Sister    Thyroid  disease Daughter    Diabetes Maternal Grandmother    Heart disease Maternal Grandfather    Cancer Paternal Grandfather        prostate   Hypertension Paternal Grandfather    Alcohol abuse Brother    Colon cancer Neg Hx    Breast cancer Neg Hx    Social History   Socioeconomic History   Marital status: Married    Spouse name: Not on file   Number of children: Not on file   Years of education: Not on file   Highest education level: Bachelor's degree (e.g., BA, AB, BS)  Occupational History   Not on file  Tobacco Use   Smoking status: Some Days    Current packs/day: 0.00    Average packs/day: 0.5 packs/day for 40.0 years (20.0 ttl pk-yrs)    Types: Cigarettes    Passive  exposure: Past   Smokeless tobacco: Never   Tobacco comments:    2-3 cigs weekly   Vaping Use   Vaping status: Never Used  Substance and Sexual Activity   Alcohol use: No   Drug use: Never   Sexual activity: Not Currently    Birth control/protection: Post-menopausal  Other Topics Concern   Not on file  Social History Narrative   Not on file   Social Drivers of Health   Financial Resource Strain: Low Risk  (01/25/2024)   Overall Financial Resource Strain (CARDIA)    Difficulty of Paying Living Expenses: Not hard at all  Food Insecurity: No Food Insecurity (01/25/2024)   Hunger Vital Sign    Worried About Running Out of Food in the Last Year: Never true    Ran Out of Food in the Last Year: Never true  Transportation Needs: No Transportation Needs (01/25/2024)   PRAPARE - Administrator, Civil Service (Medical): No    Lack of Transportation (Non-Medical): No  Physical Activity: Inactive (01/25/2024)   Exercise Vital Sign    Days of Exercise per Week: 0 days    Minutes of Exercise per Session: 0 min  Stress: No Stress Concern Present (01/25/2024)   [employer] of Occupational Health - Occupational Stress Questionnaire    Feeling of Stress: Not at all  Social Connections: Moderately Integrated (01/25/2024)   Social Connection and Isolation Panel    Frequency of Communication with Friends and Family: More than three times a week    Frequency of Social Gatherings with Friends and Family: Twice a week    Attends Religious Services: 1 to 4 times per year    Active Member of [employer] or Organizations: No    Attends Engineer, structural: Never    Marital Status: Married    Tobacco Counseling Ready to quit: Not Answered Counseling given: Not Answered Tobacco comments: 2-3 cigs weekly     Clinical Intake:  Pre-visit preparation completed: Yes  Pain : No/denies pain     Nutritional Risks: None Diabetes: Yes CBG done?: No Did pt. bring in CBG monitor from home?: No  Lab Results  Component Value Date   HGBA1C 6.2 (H) 12/21/2023   HGBA1C 6.1 (H) 04/25/2023   HGBA1C 7.2 01/13/2023     How often do you need to have someone help you when you read instructions, pamphlets, or other written materials from your doctor or pharmacy?: 1 - Never  Interpreter Needed?: No  Information entered by :: NAllen LPN   Activities of Daily Living     01/25/2024    8:47 AM 06/13/2023    8:01 AM  In your present state of health, do you have any difficulty performing the following activities:  Hearing? 0   Vision? 1   Comment has a cataract   Difficulty concentrating or making decisions? 0   Walking or climbing stairs? 0   Dressing or bathing? 0   Doing errands, shopping? 0 0  Preparing Food and eating ? N   Using the Toilet? N   In the past six months, have you accidently leaked urine? Y   Do you have problems with loss of bowel control? N   Managing your Medications? N   Managing your Finances? N   Housekeeping or managing your Housekeeping? N     Patient Care Team: Chandra Toribio POUR, MD as PCP - General (Family  Medicine) Delford Maude BROCKS, MD as PCP - Cardiology (Cardiology) Kassie Mallick,  MD (Inactive) as Consulting Physician (Endocrinology)  I have updated your Care Teams any recent Medical Services you may have received from other providers in the past year.     Assessment:   This is a routine wellness examination for Lucia.  Hearing/Vision screen Hearing Screening - Comments:: Denies hearing issues Vision Screening - Comments:: Regular eye exams, [employer]   Goals Addressed             This Visit's Progress    Patient Stated       01/25/2024, keep weight down and stay healthy       Depression Screen     01/25/2024    8:59 AM 12/26/2023   10:59 AM 11/07/2023   10:12 AM 01/17/2023    8:43 AM 09/13/2022    8:58 AM 05/10/2022    9:12 AM 09/28/2021    9:03 AM  PHQ 2/9 Scores  PHQ - 2 Score 0 0 0 0 0 0 0  PHQ- 9 Score 0 0 0   1 0    Fall Risk     01/25/2024    8:58 AM 01/17/2023    8:45 AM 01/17/2023    8:10 AM 09/13/2022    9:29 AM 09/28/2021    9:02 AM  Fall Risk   Falls in the past year? 0 0 0 1 0  Number falls in past yr: 0 0 0 0 0  Injury with Fall? 0 0  0 0  Risk for fall due to : Medication side effect No Fall Risks   No Fall Risks  Follow up Falls evaluation completed;Falls prevention discussed Falls prevention discussed   Falls evaluation completed      Data saved with a previous flowsheet row definition    MEDICARE RISK AT HOME:  Medicare Risk at Home Any stairs in or around the home?: Yes If so, are there any without handrails?: No Home free of loose throw rugs in walkways, pet beds, electrical cords, etc?: Yes Adequate lighting in your home to reduce risk of falls?: Yes Life alert?: No Use of a cane, walker or w/c?: No Grab bars in the bathroom?: No Shower chair or bench in shower?: Yes Elevated toilet seat or a handicapped toilet?: No  TIMED UP AND GO:  Was the test performed?  No  Cognitive Function: 6CIT completed        01/25/2024    9:00 AM 01/17/2023     8:46 AM 02/08/2021    8:48 AM  6CIT Screen  What Year? 0 points 0 points 0 points  What month? 0 points 0 points 0 points  What time? 0 points 0 points 0 points  Count back from 20 0 points 0 points 0 points  Months in reverse 0 points 0 points 0 points  Repeat phrase 0 points 0 points 0 points  Total Score 0 points 0 points 0 points    Immunizations Immunization History  Administered Date(s) Administered   Fluad Quad(high Dose 65+) 09/13/2022   Influenza Whole 04/09/2008   Influenza, High Dose Seasonal PF 05/17/2017, 08/21/2018, 05/11/2019   Influenza,inj,Quad PF,6+ Mos 04/18/2016   Moderna Sars-Covid-2 Vaccination 09/09/2019, 10/10/2019   PNEUMOCOCCAL CONJUGATE-20 04/26/2023   Pneumococcal Conjugate-13 09/20/2017   Tdap 09/20/2017    Screening Tests Health Maintenance  Topic Date Due   Zoster Vaccines- Shingrix (1 of 2) Never done   Hepatitis B Vaccines (1 of 3 - Risk 3-dose series) Never done   MAMMOGRAM  03/05/2023   COVID-19 Vaccine (3 -  2024-25 season) 03/12/2023   Diabetic kidney evaluation - Urine ACR  09/13/2023   FOOT EXAM  01/13/2024   INFLUENZA VACCINE  02/09/2024   OPHTHALMOLOGY EXAM  02/14/2024   Lung Cancer Screening  06/19/2024   HEMOGLOBIN A1C  06/21/2024   Diabetic kidney evaluation - eGFR measurement  12/20/2024   Medicare Annual Wellness (AWV)  01/24/2025   Fecal DNA (Cologuard)  02/19/2026   DTaP/Tdap/Td (2 - Td or Tdap) 09/21/2027   Pneumococcal Vaccine: 50+ Years  Completed   DEXA SCAN  Completed   Hepatitis C Screening  Completed   HPV VACCINES  Aged Out   Meningococcal B Vaccine  Aged Out   Colonoscopy  Discontinued    Health Maintenance  Health Maintenance Due  Topic Date Due   Zoster Vaccines- Shingrix (1 of 2) Never done   Hepatitis B Vaccines (1 of 3 - Risk 3-dose series) Never done   MAMMOGRAM  03/05/2023   COVID-19 Vaccine (3 - 2024-25 season) 03/12/2023   Diabetic kidney evaluation - Urine ACR  09/13/2023   FOOT EXAM   01/13/2024   Health Maintenance Items Addressed: Mammogram ordered, Diabetic Foot Exam recommended, Due for covid vaccine. N/A for shingles vaccine  Additional Screening:  Vision Screening: Recommended annual ophthalmology exams for early detection of glaucoma and other disorders of the eye. Would you like a referral to an eye doctor? No    Dental Screening: Recommended annual dental exams for proper oral hygiene  Community Resource Referral / Chronic Care Management: CRR required this visit?  No   CCM required this visit?  No   Plan:    I have personally reviewed and noted the following in the patient's chart:   Medical and social history Use of alcohol, tobacco or illicit drugs  Current medications and supplements including opioid prescriptions. Patient is not currently taking opioid prescriptions. Functional ability and status Nutritional status Physical activity Advanced directives List of other physicians Hospitalizations, surgeries, and ER visits in previous 12 months Vitals Screenings to include cognitive, depression, and falls Referrals and appointments  In addition, I have reviewed and discussed with patient certain preventive protocols, quality metrics, and best practice recommendations. A written personalized care plan for preventive services as well as general preventive health recommendations were provided to patient.   Ardella FORBES Dawn, LPN   2/82/7974   After Visit Summary: (MyChart) Due to this being a telephonic visit, the after visit summary with patients personalized plan was offered to patient via MyChart   Notes: Nothing significant to report at this time.

## 2024-02-12 ENCOUNTER — Ambulatory Visit
Admission: RE | Admit: 2024-02-12 | Discharge: 2024-02-12 | Disposition: A | Discharge status: Home / Self Care | Source: Ambulatory Visit | Attending: Family Medicine | Admitting: Family Medicine

## 2024-02-12 DIAGNOSIS — Z1231 Encounter for screening mammogram for malignant neoplasm of breast: Secondary | ICD-10-CM

## 2024-02-19 ENCOUNTER — Other Ambulatory Visit: Payer: Self-pay | Admitting: Family Medicine

## 2024-02-19 DIAGNOSIS — E1169 Type 2 diabetes mellitus with other specified complication: Secondary | ICD-10-CM

## 2024-03-14 ENCOUNTER — Other Ambulatory Visit: Payer: Self-pay | Admitting: Family Medicine

## 2024-03-14 DIAGNOSIS — J392 Other diseases of pharynx: Secondary | ICD-10-CM

## 2024-03-14 DIAGNOSIS — E1169 Type 2 diabetes mellitus with other specified complication: Secondary | ICD-10-CM

## 2024-04-17 NOTE — Progress Notes (Signed)
 Autumn Walker                                          MRN: [id number]   04/17/2024   The [facility] Quality Team Specialist reviewed this patient medical record for the purposes of chart review for care gap closure. The following were reviewed: chart review for care gap closure-kidney health evaluation for diabetes:eGFR  and uACR.    [facility]BCI Quality Team

## 2024-04-26 ENCOUNTER — Ambulatory Visit: Admitting: Family Medicine

## 2024-04-26 VITALS — BP 103/59 | HR 86 | Ht 61.5 in | Wt 111.4 lb

## 2024-04-26 DIAGNOSIS — E1100 Type 2 diabetes mellitus with hyperosmolarity without nonketotic hyperglycemic-hyperosmolar coma (NKHHC): Secondary | ICD-10-CM | POA: Diagnosis not present

## 2024-04-26 DIAGNOSIS — F17200 Nicotine dependence, unspecified, uncomplicated: Secondary | ICD-10-CM

## 2024-04-26 DIAGNOSIS — Z23 Encounter for immunization: Secondary | ICD-10-CM

## 2024-04-26 DIAGNOSIS — K760 Fatty (change of) liver, not elsewhere classified: Secondary | ICD-10-CM | POA: Diagnosis not present

## 2024-04-26 DIAGNOSIS — B0229 Other postherpetic nervous system involvement: Secondary | ICD-10-CM

## 2024-04-26 LAB — POCT GLYCOSYLATED HEMOGLOBIN (HGB A1C): HbA1c POC (<> result, manual entry): 6.2 % (ref 4.0–5.6)

## 2024-04-26 NOTE — Patient Instructions (Addendum)
 It was nice to see you today,  We addressed the following topics today: - It would be a good idea for you to get the yearly low-dose lung cancer screening CT scan done. [facility] I have provided you their phone number on the printout. Please call them to schedule the appointment. Let my office know if it's easier for me to resend the order. Phone: [phone]  - You are due for your annual dilated eye exam. Please schedule this with Dr. Pandyal before the end of the year. - I will see you back in four months for a follow-up. We will determine if you are due for your annual physical at that time.  Have a great day,  Rolan Slain, MD

## 2024-04-26 NOTE — Assessment & Plan Note (Signed)
 Chronic, stable. Managed with prn gabapentin . - Continue current management.

## 2024-04-26 NOTE — Assessment & Plan Note (Signed)
 Controlled, with stable A1c of 6.2. - Continue metformin . - Plan for annual urine microalbumin to monitor for nephropathy, given concurrent CKD. - Annual diabetic foot exam completed today, sensation intact. - Discussed need for annual diabetic eye exam.

## 2024-04-26 NOTE — Progress Notes (Unsigned)
 Established Patient Office Visit  Subjective   Patient ID: Autumn Walker, female    DOB: 20-Jul-1950  Age: 73 y.o. MRN: [id number]  Chief Complaint  Patient presents with   Medical Management of Chronic Issues    HPI   Subjective - Follow up for chronic conditions. Reports feeling better now than in the last five years. No new issues or concerns. Last seen in June.  - Non-alcoholic fatty liver disease: Related to history of obesity. No hx of significant alcohol use. Recent liver elastography showed no significant fibrosis (median KPA <5), which is reassuring.  - Diet/Weight: Reports difficulty with weight control. Recently returned from a 9-day cruise and gained 5 pounds. Identifies potatoes, rice, and pasta as problematic foods. Reports reduced preference for sweet foods, as they now cause nausea.  - Type 2 Diabetes Mellitus: A1c remains stable at 6.2. Continues to take metformin . Denies any new symptoms.  - Chronic Cough: Reports a cough that lasted for six months but has now fully resolved.  - Postherpetic Neuralgia: History of shingles several years ago resulted in neuropathic pain in a section on the back. Describes pain as burning, like a bad sunburn with bees stinging in it. The pain is intermittent and has improved over the years. Takes gabapentin  as needed for this, not daily.  - Peripheral Neuropathy: Reports some numbness and tingling in feet.  - Smoking: Continues to smoke tobacco. Reports reduced frequency, sometimes going days without smoking, other days smoking 3-4 cigarettes  - Cataracts: Has been diagnosed with cataracts and will require surgery. Last eye exam was in 2024 with Dr. Gaylia. Due for annual exam.  Medications Metformin , Crestor , Flonase  (seasonal use), Gabapentin  (prn for postherpetic neuralgia). Not currently taking Guaifenesin  or Fluconazole .  PMH, PSH, FH, Social Hx PMHx: Non-alcoholic fatty liver disease, obesity, type 2 diabetes  mellitus, hyperlipidemia, chronic kidney disease (mild), history of shingles with subsequent postherpetic neuralgia, cataracts. Social Hx: Smokes cigarettes, variable daily amount. Denies alcohol use.  ROS Constitutional: Denies fever, chills. Reports recent weight gain of 5 lbs. HEENT: Reports need for cataract surgery. Respiratory: Denies cough. GI: Reports nausea with overly sweet or rich foods. Denies other GI complaints. Neurological: Reports numbness and tingling in feet. Reports intermittent burning neuropathic pain on back.   The ASCVD Risk score (Arnett DK, et al., 2019) failed to calculate for the following reasons:   Risk score cannot be calculated because patient has a medical history suggesting prior/existing ASCVD  Health Maintenance Due  Topic Date Due   Zoster Vaccines- Shingrix (1 of 2) Never done   Diabetic kidney evaluation - Urine ACR  09/13/2023   OPHTHALMOLOGY EXAM  02/14/2024   COVID-19 Vaccine (3 - 2025-26 season) 03/11/2024      Objective:     BP (!) 103/59   Pulse 86   Ht 5' 1.5 (1.562 m)   Wt 111 lb 6.4 oz (50.5 kg)   SpO2 97%   BMI 20.71 kg/m    Physical Exam Gen: alert, oriented Pulm: no respiratory distress Psych: pleasant affect EXTREMITIES: Monofilament testing performed on bilateral feet. Sensation intact to light touch and vibration.    Results for orders placed or performed in visit on 04/26/24  POCT glycosylated hemoglobin (Hb A1C)  Result Value Ref Range   Hemoglobin A1C     HbA1c POC (<> result, manual entry) 6.2 4.0 - 5.6 %   HbA1c, POC (prediabetic range)     HbA1c, POC (controlled diabetic range)  Assessment & Plan:   Type 2 diabetes mellitus with hyperosmolarity without coma, without long-term current use of insulin  (HCC) Assessment & Plan: Controlled, with stable A1c of 6.2. - Continue metformin . - Plan for annual urine microalbumin to monitor for nephropathy, given concurrent CKD. - Annual diabetic foot  exam completed today, sensation intact. - Discussed need for annual diabetic eye exam.  Orders: -     POCT glycosylated hemoglobin (Hb A1C) -     Microalbumin / creatinine urine ratio  Immunization due -     Flu vaccine HIGH DOSE PF(Fluzone Trivalent)  PHN (postherpetic neuralgia) Assessment & Plan: Chronic, stable. Managed with prn gabapentin . - Continue current management.   Tobacco use disorder Assessment & Plan:  Active smoker. - Counseled on importance of smoking cessation. - Discussed annual low-dose CT for lung cancer screening. An order from 11/2023 by Dr. Nishan [facility]. Patient advised to call to schedule or I can resend the order.   Hepatic steatosis Assessment & Plan: Stable. History of obesity. Reassuring liver elastography in 12/2023 showed no significant fibrosis. Liver enzymes are normal. - Continue lifestyle modifications including diet and weight management. - No further workup needed at this time.      Return in about 4 months (around 08/27/2024) for physical.    Toribio MARLA Slain, MD

## 2024-04-26 NOTE — Assessment & Plan Note (Signed)
 Stable. History of obesity. Reassuring liver elastography in 12/2023 showed no significant fibrosis. Liver enzymes are normal. - Continue lifestyle modifications including diet and weight management. - No further workup needed at this time.

## 2024-04-26 NOTE — Assessment & Plan Note (Signed)
 Active smoker. - Counseled on importance of smoking cessation. - Discussed annual low-dose CT for lung cancer screening. An order from 11/2023 by Dr. Nishan [facility]. Patient advised to call to schedule or I can resend the order.

## 2024-04-27 LAB — MICROALBUMIN / CREATININE URINE RATIO
Creatinine, Urine: 21.8 mg/dL
Microalb/Creat Ratio: 17 mg/g{creat} (ref 0–29)
Microalbumin, Urine: 3.7 ug/mL
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
## 2024-06-10 ENCOUNTER — Ambulatory Visit: Attending: Physician Assistant | Admitting: Physician Assistant

## 2024-06-10 ENCOUNTER — Ambulatory Visit (HOSPITAL_COMMUNITY)
Admission: RE | Admit: 2024-06-10 | Discharge: 2024-06-10 | Disposition: A | Source: Ambulatory Visit | Attending: Cardiovascular Disease | Admitting: Cardiovascular Disease

## 2024-06-10 ENCOUNTER — Ambulatory Visit: Payer: Self-pay | Admitting: Cardiovascular Disease

## 2024-06-10 VITALS — BP 90/46 | HR 84 | Ht 61.5 in | Wt 111.4 lb

## 2024-06-10 DIAGNOSIS — Z8673 Personal history of transient ischemic attack (TIA), and cerebral infarction without residual deficits: Secondary | ICD-10-CM | POA: Diagnosis not present

## 2024-06-10 DIAGNOSIS — F17200 Nicotine dependence, unspecified, uncomplicated: Secondary | ICD-10-CM | POA: Diagnosis not present

## 2024-06-10 DIAGNOSIS — E785 Hyperlipidemia, unspecified: Secondary | ICD-10-CM

## 2024-06-10 DIAGNOSIS — J449 Chronic obstructive pulmonary disease, unspecified: Secondary | ICD-10-CM

## 2024-06-10 DIAGNOSIS — I5030 Unspecified diastolic (congestive) heart failure: Secondary | ICD-10-CM | POA: Diagnosis not present

## 2024-06-10 DIAGNOSIS — Z952 Presence of prosthetic heart valve: Secondary | ICD-10-CM | POA: Diagnosis not present

## 2024-06-10 LAB — ECHOCARDIOGRAM COMPLETE
AR max vel: 4.94 cm2
AV Area VTI: 4.5 cm2
AV Area mean vel: 4.94 cm2
AV Mean grad: 6 mmHg
AV Peak grad: 11.2 mmHg
Ao pk vel: 1.67 m/s
Area-P 1/2: 3.91 cm2
S' Lateral: 2.55 cm

## 2024-06-10 NOTE — Patient Instructions (Signed)
 Medication Instructions:  Your physician recommends that you continue on your current medications as directed. Please refer to the Current Medication list given to you today.  Zetia  has been removed from your medication list since you are no longer taking it.  *If you need a refill on your cardiac medications before your next appointment, please call your pharmacy*  Lab Work: None needed If you have labs (blood work) drawn today and your tests are completely normal, you will receive your results only by: MyChart Message (if you have MyChart) OR A paper copy in the mail If you have any lab test that is abnormal or we need to change your treatment, we will call you to review the results.  Testing/Procedures: 1 year - 06/2025 Your physician has requested that you have an echocardiogram. Echocardiography is a painless test that uses sound waves to create images of your heart. It provides your doctor with information about the size and shape of your heart and how well your heart's chambers and valves are working. This procedure takes approximately one hour. There are no restrictions for this procedure. Please do NOT wear cologne, perfume, aftershave, or lotions (deodorant is allowed). Please arrive 15 minutes prior to your appointment time.  Please note: We ask at that you not bring children with you during ultrasound (echo/ vascular) testing. Due to room size and safety concerns, children are not allowed in the ultrasound rooms during exams. Our front office staff cannot provide observation of children in our lobby area while testing is being conducted. An adult accompanying a patient to their appointment will only be allowed in the ultrasound room at the discretion of the ultrasound technician under special circumstances. We apologize for any inconvenience.   Follow-Up: At [facility], you and your health needs are our priority.  As part of our continuing mission to provide you with  exceptional heart care, our providers are all part of one team.  This team includes your primary Cardiologist (physician) and Advanced Practice Providers or APPs (Physician Assistants and Nurse Practitioners) who all work together to provide you with the care you need, when you need it.  Your next appointment:   1 year(s)  Provider:   Maude Emmer, MD    We recommend signing up for the patient portal called MyChart.  Sign up information is provided on this After Visit Summary.  MyChart is used to connect with patients for Virtual Visits (Telemedicine).  Patients are able to view lab/test results, encounter notes, upcoming appointments, etc.  Non-urgent messages can be sent to your provider as well.   To learn more about what you can do with MyChart, go to [email or web address].

## 2024-06-14 ENCOUNTER — Other Ambulatory Visit (HOSPITAL_COMMUNITY): Payer: Medicare HMO

## 2024-06-14 ENCOUNTER — Ambulatory Visit: Payer: Medicare HMO

## 2024-08-21 ENCOUNTER — Other Ambulatory Visit

## 2024-08-28 ENCOUNTER — Encounter: Admitting: Family Medicine

## 2024-11-14 ENCOUNTER — Ambulatory Visit (HOSPITAL_COMMUNITY)

## 2025-02-27 ENCOUNTER — Ambulatory Visit

## 2025-05-26 ENCOUNTER — Ambulatory Visit (HOSPITAL_COMMUNITY)
